# Patient Record
Sex: Female | Born: 1960
Health system: Southern US, Community
[De-identification: ages and names within clinical notes are randomized; demographics above are authoritative.]

## PROBLEM LIST (undated history)

## (undated) DIAGNOSIS — J302 Other seasonal allergic rhinitis: Secondary | ICD-10-CM

## (undated) DIAGNOSIS — R002 Palpitations: Secondary | ICD-10-CM

## (undated) DIAGNOSIS — Z8701 Personal history of pneumonia (recurrent): Secondary | ICD-10-CM

## (undated) DIAGNOSIS — M1990 Unspecified osteoarthritis, unspecified site: Secondary | ICD-10-CM

## (undated) DIAGNOSIS — T7840XA Allergy, unspecified, initial encounter: Secondary | ICD-10-CM

## (undated) DIAGNOSIS — D649 Anemia, unspecified: Secondary | ICD-10-CM

## (undated) HISTORY — DX: Other seasonal allergic rhinitis: J30.2

## (undated) HISTORY — PX: KNEE ARTHROSCOPY: SUR90

## (undated) HISTORY — PX: COLONOSCOPY: SHX174

## (undated) HISTORY — DX: Anemia, unspecified: D64.9

## (undated) HISTORY — DX: Allergy, unspecified, initial encounter: T78.40XA

---

## 1998-02-02 ENCOUNTER — Other Ambulatory Visit: Admission: RE | Admit: 1998-02-02 | Discharge: 1998-02-02 | Payer: Self-pay | Admitting: Obstetrics & Gynecology

## 1998-02-02 ENCOUNTER — Other Ambulatory Visit: Admission: RE | Admit: 1998-02-02 | Discharge: 1998-02-02 | Payer: Self-pay | Admitting: Obstetrics and Gynecology

## 1999-05-22 ENCOUNTER — Other Ambulatory Visit: Admission: RE | Admit: 1999-05-22 | Discharge: 1999-05-22 | Payer: Self-pay | Admitting: Obstetrics and Gynecology

## 2000-08-25 ENCOUNTER — Other Ambulatory Visit: Admission: RE | Admit: 2000-08-25 | Discharge: 2000-08-25 | Payer: Self-pay | Admitting: Obstetrics and Gynecology

## 2000-12-31 ENCOUNTER — Encounter: Payer: Self-pay | Admitting: Obstetrics and Gynecology

## 2000-12-31 ENCOUNTER — Ambulatory Visit (HOSPITAL_COMMUNITY): Admission: RE | Admit: 2000-12-31 | Discharge: 2000-12-31 | Payer: Self-pay | Admitting: Obstetrics and Gynecology

## 2007-10-15 ENCOUNTER — Ambulatory Visit: Payer: Self-pay | Admitting: Family Medicine

## 2007-10-15 DIAGNOSIS — G43009 Migraine without aura, not intractable, without status migrainosus: Secondary | ICD-10-CM

## 2007-10-15 DIAGNOSIS — K219 Gastro-esophageal reflux disease without esophagitis: Secondary | ICD-10-CM | POA: Insufficient documentation

## 2007-10-15 DIAGNOSIS — I4949 Other premature depolarization: Secondary | ICD-10-CM | POA: Insufficient documentation

## 2008-03-16 ENCOUNTER — Ambulatory Visit (HOSPITAL_COMMUNITY): Admission: RE | Admit: 2008-03-16 | Discharge: 2008-03-16 | Payer: Self-pay | Admitting: Obstetrics and Gynecology

## 2008-03-24 ENCOUNTER — Ambulatory Visit: Payer: Self-pay | Admitting: Family Medicine

## 2008-04-14 ENCOUNTER — Ambulatory Visit: Payer: Self-pay | Admitting: Family Medicine

## 2008-09-09 ENCOUNTER — Telehealth: Payer: Self-pay | Admitting: Family Medicine

## 2009-02-22 ENCOUNTER — Encounter: Payer: Self-pay | Admitting: Internal Medicine

## 2009-02-24 ENCOUNTER — Ambulatory Visit: Payer: Self-pay | Admitting: Family Medicine

## 2009-03-01 ENCOUNTER — Encounter: Payer: Self-pay | Admitting: Gastroenterology

## 2009-03-21 ENCOUNTER — Ambulatory Visit (HOSPITAL_COMMUNITY): Admission: RE | Admit: 2009-03-21 | Discharge: 2009-03-21 | Payer: Self-pay | Admitting: Obstetrics and Gynecology

## 2009-03-28 DIAGNOSIS — R928 Other abnormal and inconclusive findings on diagnostic imaging of breast: Secondary | ICD-10-CM | POA: Insufficient documentation

## 2009-03-29 ENCOUNTER — Encounter: Admission: RE | Admit: 2009-03-29 | Discharge: 2009-03-29 | Payer: Self-pay | Admitting: Obstetrics and Gynecology

## 2010-09-25 ENCOUNTER — Ambulatory Visit (HOSPITAL_COMMUNITY)
Admission: RE | Admit: 2010-09-25 | Discharge: 2010-09-25 | Payer: Self-pay | Source: Home / Self Care | Attending: Family Medicine | Admitting: Family Medicine

## 2010-11-27 ENCOUNTER — Ambulatory Visit: Payer: Self-pay | Admitting: Family Medicine

## 2010-12-04 ENCOUNTER — Encounter: Payer: Self-pay | Admitting: Family Medicine

## 2011-01-21 ENCOUNTER — Ambulatory Visit (INDEPENDENT_AMBULATORY_CARE_PROVIDER_SITE_OTHER): Payer: Commercial Managed Care - PPO | Admitting: Family Medicine

## 2011-01-21 ENCOUNTER — Other Ambulatory Visit (HOSPITAL_COMMUNITY)
Admission: RE | Admit: 2011-01-21 | Discharge: 2011-01-21 | Disposition: A | Discharge status: Home / Self Care | Payer: 59 | Source: Ambulatory Visit | Attending: Family Medicine | Admitting: Family Medicine

## 2011-01-21 ENCOUNTER — Encounter: Payer: Self-pay | Admitting: Family Medicine

## 2011-01-21 VITALS — BP 90/60 | HR 72 | Temp 99.1°F | Ht 66.5 in | Wt 154.0 lb

## 2011-01-21 DIAGNOSIS — Z1212 Encounter for screening for malignant neoplasm of rectum: Secondary | ICD-10-CM

## 2011-01-21 DIAGNOSIS — Z01419 Encounter for gynecological examination (general) (routine) without abnormal findings: Secondary | ICD-10-CM | POA: Insufficient documentation

## 2011-01-21 DIAGNOSIS — K219 Gastro-esophageal reflux disease without esophagitis: Secondary | ICD-10-CM

## 2011-01-21 DIAGNOSIS — Z1159 Encounter for screening for other viral diseases: Secondary | ICD-10-CM | POA: Insufficient documentation

## 2011-01-21 DIAGNOSIS — Z0000 Encounter for general adult medical examination without abnormal findings: Secondary | ICD-10-CM

## 2011-01-21 MED ORDER — ESOMEPRAZOLE MAGNESIUM 40 MG PO CPDR: 40.0000 mg | DELAYED_RELEASE_CAPSULE | Freq: Two times a day (BID) | ORAL | Status: DC

## 2011-01-21 NOTE — Patient Instructions (Addendum)
Call if interested in referral to GI for colonoscopy. Return stool cards in meantime. Work on regular exercise. Start calcium and vit D 600 mg/400 IU daily to twice a day.

## 2011-01-21 NOTE — Assessment & Plan Note (Signed)
Poor control... Breakthrough symptoms later in the day. Other treaments ineffective.  pt not interested in ENDO or GI referral at this time.  Will try BID dosing of nexium.

## 2011-01-21 NOTE — Progress Notes (Signed)
Subjective:    Patient ID: Taylor Cain, female    DOB: 08/28/1961, 50 y.o.   MRN: [id number]  HPI The patient is here for annual wellness exam and preventative care.   HAs not been seen in office since 2010.Marland Kitchen Last CPX in 2009.  GERD, chronic poor control. Off kapidex ... Helped a lot with later day symptoms but caused constipaton severe. Since off meds has constant symptoms. Has to use daily tums, zantac. Mouth full of sores from acid.   In past nexium has helped except by late evening and night she has significant breakthrough reflux symptoms. Prilosec helps nut has same breakthrough possibilities.  Has never had an endoscopy.   Allergies, chronic seasonal, well controlled on claritin prn.  Recent labs: [facility] at [facility]: Chol: 168, LDL 89, HDL 63, Tri 79  Hg 11.1.. No dizziness, no SOB, no fatigue. Regular menses.Marland KitchenMarland KitchenOn heaviest day 3 super tampons.  DXA nml at age 43.   Review of Systems  Constitutional: Negative for fever, fatigue and unexpected weight change.  HENT: Negative for ear pain, congestion, sore throat, sneezing, trouble swallowing and sinus pressure.   Eyes: Negative for pain and itching.  Respiratory: Negative for cough, shortness of breath and wheezing.   Cardiovascular: Negative for chest pain, palpitations and leg swelling.  Gastrointestinal: Positive for constipation. Negative for nausea, vomiting, abdominal pain, diarrhea, blood in stool and abdominal distention.  Genitourinary: Negative for dysuria, hematuria, vaginal discharge, difficulty urinating and menstrual problem.  Skin: Negative for rash.  Neurological: Negative for syncope, weakness, light-headedness, numbness and headaches.  Psychiatric/Behavioral: Negative for confusion and dysphoric mood. The patient is not nervous/anxious.        Objective:   Physical Exam  Constitutional: Vital signs are normal. She appears well-developed and well-nourished. She is cooperative.  Non-toxic appearance.  She does not appear ill. No distress.  HENT:  Head: Normocephalic.  Right Ear: Hearing, tympanic membrane, external ear and ear canal normal.  Left Ear: Hearing, tympanic membrane, external ear and ear canal normal.  Nose: Nose normal.  Eyes: Conjunctivae, EOM and lids are normal. Pupils are equal, round, and reactive to light. No foreign bodies found.  Neck: Trachea normal and normal range of motion. Neck supple. Carotid bruit is not present. No mass and no thyromegaly present.  Cardiovascular: Normal rate, regular rhythm, S1 normal, S2 normal, normal heart sounds and intact distal pulses.  Exam reveals no gallop.   No murmur heard. Pulmonary/Chest: Effort normal and breath sounds normal. No respiratory distress. She has no wheezes. She has no rhonchi. She has no rales.  Abdominal: Soft. Normal appearance and bowel sounds are normal. She exhibits no distension, no fluid wave, no abdominal bruit and no mass. There is no hepatosplenomegaly. There is no tenderness. There is no rebound, no guarding and no CVA tenderness. No hernia.  Genitourinary: Vagina normal and uterus normal. No breast swelling, tenderness, discharge or bleeding. Pelvic exam was performed with patient prone. There is no rash, tenderness or lesion on the right labia. There is no rash, tenderness or lesion on the left labia. Uterus is not enlarged and not tender. Cervix exhibits motion tenderness. Cervix exhibits no discharge and no friability. Right adnexum displays no mass, no tenderness and no fullness. Left adnexum displays no mass, no tenderness and no fullness.  Lymphadenopathy:    She has no cervical adenopathy.    She has no axillary adenopathy.  Neurological: She is alert. She has normal strength. No cranial nerve deficit or sensory deficit.  Skin: Skin is warm, dry and intact. No rash noted.  Psychiatric: Her speech is normal and behavior is normal. Judgment normal. Her mood appears not anxious. Cognition and memory are  normal. She does not exhibit a depressed mood.          Assessment & Plan:  Complete Physical Exam: The patient's preventative maintenance and recommended screening tests for an annual wellness exam were reviewed in full today. Brought up to date unless services declined.  Counselled on the importance of diet, exercise, and its role in overall health and mortality. The patient's FH and SH was reviewed, including their home life, tobacco status, and drug and alcohol status.   DXA not indicated because low risk.

## 2011-01-25 ENCOUNTER — Encounter: Payer: Self-pay | Admitting: *Deleted

## 2011-02-20 ENCOUNTER — Other Ambulatory Visit (INDEPENDENT_AMBULATORY_CARE_PROVIDER_SITE_OTHER): Payer: Commercial Managed Care - PPO | Admitting: Family Medicine

## 2011-02-20 ENCOUNTER — Other Ambulatory Visit: Payer: Commercial Managed Care - PPO

## 2011-02-20 DIAGNOSIS — Z1211 Encounter for screening for malignant neoplasm of colon: Secondary | ICD-10-CM

## 2011-02-21 ENCOUNTER — Telehealth: Payer: Self-pay | Admitting: Family Medicine

## 2011-02-21 NOTE — Telephone Encounter (Signed)
Notify patient of results.

## 2011-02-22 ENCOUNTER — Encounter: Payer: Self-pay | Admitting: *Deleted

## 2011-02-22 NOTE — Telephone Encounter (Signed)
Letter mailed to patient.

## 2011-08-13 ENCOUNTER — Encounter: Payer: Self-pay | Admitting: Family Medicine

## 2011-08-13 ENCOUNTER — Ambulatory Visit (INDEPENDENT_AMBULATORY_CARE_PROVIDER_SITE_OTHER): Payer: Commercial Managed Care - PPO | Admitting: Family Medicine

## 2011-08-13 VITALS — BP 120/70 | HR 97 | Temp 99.5°F | Ht 66.5 in | Wt 155.8 lb

## 2011-08-13 DIAGNOSIS — J189 Pneumonia, unspecified organism: Secondary | ICD-10-CM

## 2011-08-13 DIAGNOSIS — J157 Pneumonia due to Mycoplasma pneumoniae: Secondary | ICD-10-CM

## 2011-08-13 MED ORDER — DOXYCYCLINE HYCLATE 100 MG PO TABS: 100.0000 mg | ORAL_TABLET | Freq: Two times a day (BID) | ORAL | Status: AC

## 2011-08-13 NOTE — Progress Notes (Signed)
  Subjective:    Patient ID: Taylor Cain, female    DOB: 29-Nov-1960, 50 y.o.   MRN: [id number]  HPI  Taylor Cain, a 50 y.o. female presents today in the office for the following:    Pneumonia: Patient complains of evaluation of cough, evaluation of fever, possible bronchitis, possible pneumonia. Patient describes symptoms of shortness of breath on exertion, pleuritic chest pain, arthralgias, cough, fever to 101, headache, malaise and sputum production. Symptoms began 2 weeks ago and are gradually worsening since that time. Patient denies nausea and vomiting or history of previous pneumonias. Treatment thus far includes OTC analgesics/antipyretics: not very effective and anti-tussive: not very effective Past pulmonary history is significant for no history of pneumonia or bronchitis   The PMH, PSH, Social History, Family History, Medications, and allergies have been reviewed in [facility], and have been updated if relevant.   Review of Systems ROS: GEN: Acute illness details above GI: Tolerating PO intake GU: maintaining adequate hydration and urination Pulm: No SOB Interactive and getting along well at home.  Otherwise, ROS is as per the HPI.     Objective:   Physical Exam   Physical Exam  Blood pressure 120/70, pulse 97, temperature 99.5 F (37.5 C), temperature source Oral, height 5' 6.5" (1.689 m), weight 155 lb 12.8 oz (70.67 kg), SpO2 98.00%.  GEN: A and O x 3. WDWN. NAD.    ENT: Nose clear, ext NML.  No LAD.  No JVD.  TM's clear. Oropharynx clear.  PULM: Normal WOB, no distress. No crackles. Rhonchi - midlungs without crackles CV: RRR, no M/G/R, No rubs, No JVD.   ABD: S, NT, ND, + BS. No rebound. No guarding. No HSM.   EXT: warm and well-perfused, No c/c/e. PSYCH: Pleasant and conversant.       Assessment & Plan:   1. Walking pneumonia  doxycycline (VIBRA-TABS) 100 MG tablet    rx with doxy Prn cough meds

## 2011-09-06 ENCOUNTER — Telehealth: Payer: Self-pay | Admitting: Internal Medicine

## 2011-09-06 MED ORDER — MOXIFLOXACIN HCL 400 MG PO TABS: 400.0000 mg | ORAL_TABLET | Freq: Every day | ORAL | Status: AC

## 2011-09-06 NOTE — Telephone Encounter (Signed)
Please call to get more info.. When diagnosed? What antibiotic, when completed etc.

## 2011-09-06 NOTE — Telephone Encounter (Signed)
Patient called and stated she was Dx with pneumonia and she is still having a nagging cough and chest slightly tight and wanted to know what she could do.

## 2011-09-06 NOTE — Telephone Encounter (Signed)
Patient diagnosed on 08-13-2011 Doxycycline 10 day course [facility] 08-23-2011

## 2011-09-06 NOTE — Telephone Encounter (Signed)
Let pt know we will broaden antibiotics... 5 day course of avelox. If not improved after completed make appt for re-eval.

## 2011-09-06 NOTE — Telephone Encounter (Signed)
Patient advised.

## 2011-10-02 ENCOUNTER — Other Ambulatory Visit: Payer: Self-pay | Admitting: Family Medicine

## 2013-01-22 ENCOUNTER — Other Ambulatory Visit: Payer: Self-pay | Admitting: Family Medicine

## 2013-01-22 DIAGNOSIS — Z1231 Encounter for screening mammogram for malignant neoplasm of breast: Secondary | ICD-10-CM

## 2013-01-25 ENCOUNTER — Other Ambulatory Visit (INDEPENDENT_AMBULATORY_CARE_PROVIDER_SITE_OTHER): Payer: Commercial Managed Care - PPO

## 2013-01-25 DIAGNOSIS — Z13220 Encounter for screening for lipoid disorders: Secondary | ICD-10-CM

## 2013-01-25 DIAGNOSIS — Z131 Encounter for screening for diabetes mellitus: Secondary | ICD-10-CM

## 2013-01-25 DIAGNOSIS — Z0000 Encounter for general adult medical examination without abnormal findings: Secondary | ICD-10-CM

## 2013-01-25 LAB — CBC WITH DIFFERENTIAL/PLATELET
Basophils Relative: 1.5 % (ref 0.0–3.0)
Eosinophils Relative: 3.6 % (ref 0.0–5.0)
HCT: 29.3 % — ABNORMAL LOW (ref 36.0–46.0)
MCV: 75.8 fl — ABNORMAL LOW (ref 78.0–100.0)
Monocytes Absolute: 0.6 10*3/uL (ref 0.1–1.0)
Monocytes Relative: 12.5 % — ABNORMAL HIGH (ref 3.0–12.0)
Neutrophils Relative %: 41.6 % — ABNORMAL LOW (ref 43.0–77.0)
RBC: 3.87 Mil/uL (ref 3.87–5.11)
WBC: 4.6 10*3/uL (ref 4.5–10.5)

## 2013-01-25 LAB — COMPREHENSIVE METABOLIC PANEL
AST: 22 U/L (ref 0–37)
Alkaline Phosphatase: 43 U/L (ref 39–117)
BUN: 13 mg/dL (ref 6–23)
Calcium: 9 mg/dL (ref 8.4–10.5)
Creatinine, Ser: 0.7 mg/dL (ref 0.4–1.2)

## 2013-01-25 LAB — LIPID PANEL
Cholesterol: 162 mg/dL (ref 0–200)
LDL Cholesterol: 86 mg/dL (ref 0–99)
Triglycerides: 91 mg/dL (ref 0.0–149.0)
VLDL: 18.2 mg/dL (ref 0.0–40.0)

## 2013-01-28 ENCOUNTER — Ambulatory Visit (HOSPITAL_COMMUNITY)
Admission: RE | Admit: 2013-01-28 | Discharge: 2013-01-28 | Disposition: A | Discharge status: Home / Self Care | Payer: 59 | Source: Ambulatory Visit | Attending: Family Medicine | Admitting: Family Medicine

## 2013-01-28 DIAGNOSIS — Z1231 Encounter for screening mammogram for malignant neoplasm of breast: Secondary | ICD-10-CM | POA: Insufficient documentation

## 2013-01-29 ENCOUNTER — Ambulatory Visit (INDEPENDENT_AMBULATORY_CARE_PROVIDER_SITE_OTHER): Payer: Commercial Managed Care - PPO | Admitting: Family Medicine

## 2013-01-29 ENCOUNTER — Encounter: Payer: Self-pay | Admitting: Family Medicine

## 2013-01-29 VITALS — BP 130/90 | HR 71 | Temp 97.4°F | Ht 66.5 in | Wt 152.5 lb

## 2013-01-29 DIAGNOSIS — L680 Hirsutism: Secondary | ICD-10-CM

## 2013-01-29 DIAGNOSIS — Z01419 Encounter for gynecological examination (general) (routine) without abnormal findings: Secondary | ICD-10-CM

## 2013-01-29 DIAGNOSIS — D509 Iron deficiency anemia, unspecified: Secondary | ICD-10-CM | POA: Insufficient documentation

## 2013-01-29 DIAGNOSIS — L03019 Cellulitis of unspecified finger: Secondary | ICD-10-CM | POA: Insufficient documentation

## 2013-01-29 DIAGNOSIS — D649 Anemia, unspecified: Secondary | ICD-10-CM

## 2013-01-29 DIAGNOSIS — Z1212 Encounter for screening for malignant neoplasm of rectum: Secondary | ICD-10-CM

## 2013-01-29 DIAGNOSIS — L700 Acne vulgaris: Secondary | ICD-10-CM | POA: Insufficient documentation

## 2013-01-29 DIAGNOSIS — L03011 Cellulitis of right finger: Secondary | ICD-10-CM

## 2013-01-29 DIAGNOSIS — L708 Other acne: Secondary | ICD-10-CM

## 2013-01-29 DIAGNOSIS — Z0000 Encounter for general adult medical examination without abnormal findings: Secondary | ICD-10-CM

## 2013-01-29 LAB — VITAMIN B12: Vitamin B-12: 697 pg/mL (ref 211–911)

## 2013-01-29 LAB — IBC PANEL
%SAT: 4 % — ABNORMAL LOW (ref 20–55)
TIBC: 423 ug/dL (ref 250–470)
UIBC: 408 ug/dL — ABNORMAL HIGH (ref 125–400)

## 2013-01-29 LAB — IRON: Iron: 15 ug/dL — ABNORMAL LOW (ref 42–145)

## 2013-01-29 MED ORDER — EFLORNITHINE HCL 13.9 % EX CREA: 1.0000 "application " | TOPICAL_CREAM | Freq: Two times a day (BID) | CUTANEOUS | Status: DC

## 2013-01-29 MED ORDER — ESOMEPRAZOLE MAGNESIUM 40 MG PO CPDR: DELAYED_RELEASE_CAPSULE | ORAL | Status: DC

## 2013-01-29 MED ORDER — DOXYCYCLINE HYCLATE 100 MG PO CAPS: 100.0000 mg | ORAL_CAPSULE | Freq: Two times a day (BID) | ORAL | Status: DC

## 2013-01-29 NOTE — Progress Notes (Signed)
The patient is here for annual wellness exam and preventative care.  She reports that her last menses was several months ago. Hot flashes at night ( 3-4 times a night).March Rummage with her sleep and quality of life. Hasn't noted any moodiness.   She wishes to consider medication to treat.  No current exercsie regimen.  Has been having an acne flare recently.Mora Appl old doxycycline 100 mg twice a day for 3 days.   In 07/2012 she smashed right thumb, has a divit, mildly tender since. Last night she began having pain in thumb, slight redness under thumb and pain at site of nail. Applied ice and ibuprofen. She has been using that thumb to put med in dogs mouth.  GERD: well controlled on nexium. Causes her some nexium.   Allergies, chronic seasonal, well controlled on claritin prn.   Reviewed labs in detail with pt. Lab Results  Component Value Date   CHOL 162 01/25/2013   HDL 57.70 01/25/2013   LDLCALC 86 01/25/2013   TRIG 91.0 01/25/2013   CHOLHDL 3 01/25/2013    Regular menses... Until stopped few weeks ago.  Anemic in past.. Hg at 9.7 She is fatigued.  DXA nml at age 13.   Review of Systems  Constitutional: Negative for fever, fatigue and unexpected weight change.  HENT: Negative for ear pain, congestion, sore throat, sneezing, trouble swallowing and sinus pressure.  Eyes: Negative for pain and itching.  Respiratory: Negative for cough, shortness of breath and wheezing.  Cardiovascular: Negative for chest pain, palpitations and leg swelling.  Gastrointestinal: Positive for constipation. Negative for nausea, vomiting, abdominal pain, diarrhea, blood in stool and abdominal distention.  Genitourinary: Negative for dysuria, hematuria, vaginal discharge, difficulty urinating and menstrual problem.  Skin: Negative for rash.  Neurological: Negative for syncope, weakness, light-headedness, numbness and headaches.  Psychiatric/Behavioral: Negative for confusion and dysphoric mood. The  patient is not nervous/anxious.  Objective:   Physical Exam  Constitutional: Vital signs are normal. She appears well-developed and well-nourished. She is cooperative. Non-toxic appearance. She does not appear ill. No distress.  HENT:  Head: Normocephalic.  Right Ear: Hearing, tympanic membrane, external ear and ear canal normal.  Left Ear: Hearing, tympanic membrane, external ear and ear canal normal.  Nose: Nose normal.  Eyes: Conjunctivae, EOM and lids are normal. Pupils are equal, round, and reactive to light. No foreign bodies found.  Neck: Trachea normal and normal range of motion. Neck supple. Carotid bruit is not present. No mass and no thyromegaly present.  Cardiovascular: Normal rate, regular rhythm, S1 normal, S2 normal, normal heart sounds and intact distal pulses. Exam reveals no gallop.  No murmur heard.  Pulmonary/Chest: Effort normal and breath sounds normal. No respiratory distress. She has no wheezes. She has no rhonchi. She has no rales.  Abdominal: Soft. Normal appearance and bowel sounds are normal. She exhibits no distension, no fluid wave, no abdominal bruit and no mass. There is no hepatosplenomegaly. There is no tenderness. There is no rebound, no guarding and no CVA tenderness. No hernia.  Genitourinary: Vagina normal and uterus normal. No breast swelling, tenderness, discharge or bleeding. Pelvic exam was performed with patient prone. There is no rash, tenderness or lesion on the right labia. There is no rash, tenderness or lesion on the left labia. Uterus is not enlarged and not tender. Left adnexum displays no mass, no tenderness and no fullness.  Lymphadenopathy:  She has no cervical adenopathy.  She has no axillary adenopathy.  Neurological: She is alert.  She has normal strength. No cranial nerve deficit or sensory deficit.  Skin: Skin is warm, dry and intact. No rash noted.  Right thumb nail tip with dent and slight redness, tender under nail at bed. No paronychia  or thumb pulp.   Psychiatric: Her speech is normal and behavior is normal. Judgment normal. Her mood appears not anxious. Cognition and memory are normal. She does not exhibit a depressed mood.  Assessment & Plan:   Complete Physical Exam:  The patient's preventative maintenance and recommended screening tests for an annual wellness exam were reviewed in full today.  Brought up to date unless services declined.  Counselled on the importance of diet, exercise, and its role in overall health and mortality.  The patient's FH and SH was reviewed, including their home life, tobacco status, and drug and alcohol status.   Vaccines:uptodate with Tdap DXA not indicated because low risk. Mammo: Had a mammogram last evening. Will await results. PAP/DVE: Nml 2012, on q 3 year schedule for pap, DVE yearly.  No smoker. Colon: IFOB yearly.

## 2013-01-29 NOTE — Patient Instructions (Addendum)
Work on regular exercise 3-5 times a week. Increase estrogen containing foods like soy some. Trial of black cohosh for hot flashes. Can try melatonin 3 mg at bedtime. Healthy sleep hygiene. Call if sleep still disturbed by hot flashes in next 1-2 months. Complete at least 10 days of the antibitoic doxycycline for skin as well as nail issue. Stop at lab on your way out for stool test and labs.

## 2013-01-29 NOTE — Assessment & Plan Note (Signed)
Vaniqa from Derm helps.. She requests refil.

## 2013-01-29 NOTE — Assessment & Plan Note (Signed)
Eval for cause...most likely iron deficiency given microcytic. Check IFOB for blood in stool.

## 2013-01-29 NOTE — Assessment & Plan Note (Signed)
Refilled doxycycline 100 mg BID

## 2013-01-29 NOTE — Assessment & Plan Note (Signed)
Possible small bacterial infection vs fungal infection in nail bed of right thumb... Treat with doxycycline as she is already on this... For 10 days total. Soak finger in warm water twice daily.

## 2013-02-04 ENCOUNTER — Encounter: Payer: Self-pay | Admitting: Internal Medicine

## 2013-02-04 ENCOUNTER — Telehealth: Payer: Self-pay | Admitting: Family Medicine

## 2013-02-04 ENCOUNTER — Ambulatory Visit (INDEPENDENT_AMBULATORY_CARE_PROVIDER_SITE_OTHER): Payer: 59 | Admitting: Internal Medicine

## 2013-02-04 VITALS — BP 118/70 | HR 90 | Temp 99.1°F | Wt 145.0 lb

## 2013-02-04 DIAGNOSIS — A088 Other specified intestinal infections: Secondary | ICD-10-CM

## 2013-02-04 DIAGNOSIS — A084 Viral intestinal infection, unspecified: Secondary | ICD-10-CM | POA: Insufficient documentation

## 2013-02-04 MED ORDER — PROMETHAZINE HCL 25 MG PO TABS: 25.0000 mg | ORAL_TABLET | Freq: Four times a day (QID) | ORAL | Status: DC | PRN

## 2013-02-04 NOTE — Telephone Encounter (Signed)
Will check at appt

## 2013-02-04 NOTE — Telephone Encounter (Signed)
Patient Information:  Caller Name: Ercelle  Phone: [phone]  Patient: Taylor Cain  Gender: Female  DOB: 1961-06-08  Age: 52 Years  PCP: Kerby Nora (Family Practice)  Pregnant: No  Office Follow Up:  Does the office need to follow up with this patient?: No  Instructions For The Office: N/A  RN Note:  vomiting > 48 hours.  States has diarrhea as well.  Taking gatorade but continues to have vomiting.  Last episode of vomiting 2130 02/03/13.  Last episode of diarrhea 1100 02/04/13.  States has crampy abdominal pain.  c/o headache and body aches.  Afebrile.  States has had > 15 diarrheal stools in past 24 hours.  Per diarrhea protocol, advised appt now; appt scheduled 1530 02/04/13 with Dr. Alphonsus Sias.  krs/can  Symptoms  Reason For Call & Symptoms: vomiting, diarrhea  Reviewed Health History In EMR: Yes  Reviewed Medications In EMR: Yes  Reviewed Allergies In EMR: Yes  Reviewed Surgeries / Procedures: Yes  Date of Onset of Symptoms: 02/02/2013 OB / GYN:  LMP: Unknown  Guideline(s) Used:  Diarrhea  Disposition Per Guideline:   Go to Office Now  Reason For Disposition Reached:   Age < 60 years and has had > 15 diarrhea stools in past 24 hours  Advice Given:  N/A  Patient Will Follow Care Advice:  YES  Appointment Scheduled:  02/04/2013 15:30:00 Appointment Scheduled Provider:  Tillman Abide (Family Practice)

## 2013-02-04 NOTE — Assessment & Plan Note (Signed)
Likely is norovirus given the systemic symptoms and length of illness Discussed pedialyte for rehydration Tylenol Phenergan to calm stomach

## 2013-02-04 NOTE — Patient Instructions (Signed)
Please try acetaminophen as needed for your headache. Try pedialyte (32-64 ounces today) for rehydration The promethazine can calm the nausea and may help you sleep

## 2013-02-04 NOTE — Progress Notes (Signed)
  Subjective:    Patient ID: Taylor Cain, female    DOB: June 04, 1961, 52 y.o.   MRN: [id number]  HPI Thinks she may have norovirus (some reports of GI illness in other staff at [facility]) Started vomiting 2 mornings ago, diarrhea started soon after Headaches, bodyaches Vomiting recurred yesterday Has only been able to drink a little bit of fluids today Now with churning in stomach again Only 2 stools today  Some burning and nausea Tried sudafed and advil yesterday No hematemesis or blood in stool Feels like she had fever yesterday  Current Outpatient Prescriptions on File Prior to Visit  Medication Sig Dispense Refill  . doxycycline (VIBRAMYCIN) 100 MG capsule Take 1 capsule (100 mg total) by mouth 2 (two) times daily.  60 capsule  5  . Eflornithine HCl 13.9 % cream Apply 1 application topically 2 (two) times daily with a meal.  30 g  11  . esomeprazole (NEXIUM) 40 MG capsule TAKE 1 CAPSULE BY MOUTH TWICE DAILY  180 capsule  3  . loratadine (CLARITIN) 10 MG tablet Take 10 mg by mouth daily.         No current facility-administered medications on file prior to visit.    No Known Allergies  Past Medical History  Diagnosis Date  . GERD (gastroesophageal reflux disease)     Past Surgical History  Procedure Laterality Date  . Knee arthroscopy  1990, 1992    torn acl    Family History  Problem Relation Age of Onset  . Migraines Mother   . Hypertension Mother   . Hypertension Maternal Grandmother   . Alzheimer's disease Maternal Grandmother   . Diabetes Maternal Grandfather     History   Social History  . Marital Status: Divorced    Spouse Name: N/A    Number of Children: N/A  . Years of Education: N/A   Occupational History  . DIRECTOR [employer]   Social History Main Topics  . Smoking status: Never Smoker   . Smokeless tobacco: Not on file  . Alcohol Use: Yes     Comment: glass of wine 3 x per week  . Drug Use: No  . Sexually Active: Not on file   Other  Topics Concern  . Not on file   Social History Narrative   No regular exercise   Eat fruits and veggies, no fast food, lean meats   Review of Systems Never had problems with doxy in past (on hold now) No cough  No SOB     Objective:   Physical Exam  Constitutional: She appears well-developed and well-nourished.  Looks uncomfortable but no distress  Neck: Normal range of motion. Neck supple.  Cardiovascular: Normal rate and regular rhythm.   Pulmonary/Chest: Effort normal and breath sounds normal. No respiratory distress. She has no wheezes. She has no rales.  Abdominal: Soft. Bowel sounds are normal. She exhibits no distension and no mass. There is no tenderness. There is no rebound and no guarding.  Lymphadenopathy:    She has no cervical adenopathy.          Assessment & Plan:

## 2013-03-08 ENCOUNTER — Telehealth: Payer: Self-pay

## 2013-03-08 NOTE — Telephone Encounter (Signed)
Spoke with pt; pt did have mammogram recently and the doctor who interpreted the mammogram was Vincenza Hews, MD. Tora Duck said that is what the EOB is for. Resolved issue.

## 2013-03-08 NOTE — Telephone Encounter (Signed)
Pt received bill for xray that she has not had. Left v/m for pt to call back.

## 2014-01-24 ENCOUNTER — Other Ambulatory Visit: Payer: Self-pay | Admitting: Family Medicine

## 2014-01-24 DIAGNOSIS — Z1231 Encounter for screening mammogram for malignant neoplasm of breast: Secondary | ICD-10-CM

## 2014-02-01 ENCOUNTER — Ambulatory Visit (HOSPITAL_COMMUNITY)
Admission: RE | Admit: 2014-02-01 | Discharge: 2014-02-01 | Disposition: A | Discharge status: Home / Self Care | Payer: 59 | Source: Ambulatory Visit | Attending: Family Medicine | Admitting: Family Medicine

## 2014-02-01 DIAGNOSIS — Z1231 Encounter for screening mammogram for malignant neoplasm of breast: Secondary | ICD-10-CM | POA: Insufficient documentation

## 2014-02-09 ENCOUNTER — Encounter: Payer: 59 | Admitting: Internal Medicine

## 2014-02-14 ENCOUNTER — Other Ambulatory Visit: Payer: Self-pay | Admitting: Family Medicine

## 2014-02-14 DIAGNOSIS — Z1231 Encounter for screening mammogram for malignant neoplasm of breast: Secondary | ICD-10-CM

## 2014-05-23 ENCOUNTER — Encounter: Payer: Self-pay | Admitting: Gastroenterology

## 2014-07-12 ENCOUNTER — Ambulatory Visit (AMBULATORY_SURGERY_CENTER): Payer: Self-pay | Admitting: *Deleted

## 2014-07-12 VITALS — Ht 66.5 in | Wt 155.2 lb

## 2014-07-12 DIAGNOSIS — Z1211 Encounter for screening for malignant neoplasm of colon: Secondary | ICD-10-CM

## 2014-07-12 MED ORDER — MOVIPREP 100 G PO SOLR: ORAL | Status: DC

## 2014-07-12 NOTE — Progress Notes (Signed)
No allergies to eggs or soy. No problems with anesthesia.  Pt given Emmi instructions for colonoscopy  No oxygen use  No diet drug use  

## 2014-07-26 ENCOUNTER — Ambulatory Visit (AMBULATORY_SURGERY_CENTER): Payer: 59 | Admitting: Gastroenterology

## 2014-07-26 ENCOUNTER — Telehealth: Payer: Self-pay

## 2014-07-26 ENCOUNTER — Encounter: Payer: Self-pay | Admitting: Gastroenterology

## 2014-07-26 VITALS — BP 112/69 | HR 57 | Temp 96.6°F | Resp 18 | Ht 66.0 in | Wt 155.0 lb

## 2014-07-26 DIAGNOSIS — Z1211 Encounter for screening for malignant neoplasm of colon: Secondary | ICD-10-CM

## 2014-07-26 MED ORDER — SODIUM CHLORIDE 0.9 % IV SOLN: 500.0000 mL | INTRAVENOUS | Status: DC

## 2014-07-26 NOTE — Op Note (Signed)
[place]  [address] [place], [id number]   COLONOSCOPY PROCEDURE REPORT  PATIENT: Taylor Cain, Taylor Cain  MR#: [id number] BIRTHDATE: [birth year] , 29  yrs. old GENDER: female ENDOSCOPIST: Ladene Artist, MD, [facility] REFERRED NI:DPOEUMP, Amy E. PROCEDURE DATE:  07/26/2014 PROCEDURE:   Colonoscopy, screening First Screening Colonoscopy - Avg.  risk and is 50 yrs.  old or older Yes.  Prior Negative Screening - Now for repeat screening. N/A  History of Adenoma - Now for follow-up colonoscopy & has been > or = to 3 yrs.  N/A  Polyps Removed Today? No.  Polyps Removed Today? No.  Recommend repeat exam, <10 yrs? Polyps Removed Today? No.  Recommend repeat exam, <10 yrs? No. ASA CLASS:   Class II INDICATIONS:average risk for colorectal cancer. MEDICATIONS: Monitored anesthesia care and Propofol 200 mg IV DESCRIPTION OF PROCEDURE:   After the risks benefits and alternatives of the procedure were thoroughly explained, informed consent was obtained.  The digital rectal exam revealed no abnormalities of the rectum.   The [id number]  endoscope was introduced through the anus and advanced to the cecum, which was identified by both the appendix and ileocecal valve. No adverse events experienced.   The quality of the prep was excellent, using MoviPrep  The instrument was then slowly withdrawn as the colon was fully examined.    COLON FINDINGS: A normal appearing cecum, ileocecal valve, and appendiceal orifice were identified.  The ascending, transverse, descending, sigmoid colon, and rectum appeared unremarkable. Retroflexed views revealed no abnormalities. The time to cecum=3 minutes 40 seconds.  Withdrawal time=12 minutes 56 seconds.  The scope was withdrawn and the procedure completed.  COMPLICATIONS: There were no immediate complications.  ENDOSCOPIC IMPRESSION: 1.  Normal colonoscopy  RECOMMENDATIONS: 1.  Continue to follow colorectal cancer screening  guidelines for "routine risk" patients with a repeat colonoscopy in 10 years. There is no need for routine, screening FOBT (stool) testing for at least 5 years.  eSigned:  Ladene Artist, MD, [facility] 07/26/2014 8:34 AM

## 2014-07-26 NOTE — Telephone Encounter (Signed)
Pt left v/m requesting access to mychart. Left v/m for pt to cb.

## 2014-07-26 NOTE — Patient Instructions (Signed)
Discharge instructions given with verbal understanding. Normal exam. Resume previous medications. YOU HAD AN ENDOSCOPIC PROCEDURE TODAY AT THE [facility]: Refer to the procedure report that was given to you for any specific questions about what was found during the examination.  If the procedure report does not answer your questions, please call your gastroenterologist to clarify.  If you requested that your care partner not be given the details of your procedure findings, then the procedure report has been included in a sealed envelope for you to review at your convenience later.  YOU SHOULD EXPECT: Some feelings of bloating in the abdomen. Passage of more gas than usual.  Walking can help get rid of the air that was put into your GI tract during the procedure and reduce the bloating. If you had a lower endoscopy (such as a colonoscopy or flexible sigmoidoscopy) you may notice spotting of blood in your stool or on the toilet paper. If you underwent a bowel prep for your procedure, then you may not have a normal bowel movement for a few days.  DIET: Your first meal following the procedure should be a light meal and then it is ok to progress to your normal diet.  A half-sandwich or bowl of soup is an example of a good first meal.  Heavy or fried foods are harder to digest and may make you feel nauseous or bloated.  Likewise meals heavy in dairy and vegetables can cause extra gas to form and this can also increase the bloating.  Drink plenty of fluids but you should avoid alcoholic beverages for 24 hours.  ACTIVITY: Your care partner should take you home directly after the procedure.  You should plan to take it easy, moving slowly for the rest of the day.  You can resume normal activity the day after the procedure however you should NOT DRIVE or use heavy machinery for 24 hours (because of the sedation medicines used during the test).    SYMPTOMS TO REPORT IMMEDIATELY: A gastroenterologist  can be reached at any hour.  During normal business hours, 8:30 AM to 5:00 PM Monday through Friday, call [phone].  After hours and on weekends, please call the GI answering service at [phone] who will take a message and have the physician on call contact you.   Following lower endoscopy (colonoscopy or flexible sigmoidoscopy):  Excessive amounts of blood in the stool  Significant tenderness or worsening of abdominal pains  Swelling of the abdomen that is new, acute  Fever of 100F or higher  FOLLOW UP: If any biopsies were taken you will be contacted by phone or by letter within the next 1-3 weeks.  Call your gastroenterologist if you have not heard about the biopsies in 3 weeks.  Our staff will call the home number listed on your records the next business day following your procedure to check on you and address any questions or concerns that you may have at that time regarding the information given to you following your procedure. This is a courtesy call and so if there is no answer at the home number and we have not heard from you through the emergency physician on call, we will assume that you have returned to your regular daily activities without incident.  SIGNATURES/CONFIDENTIALITY: You and/or your care partner have signed paperwork which will be entered into your electronic medical record.  These signatures attest to the fact that that the information above on your After Visit Summary has been reviewed   and is understood.  Full responsibility of the confidentiality of this discharge information lies with you and/or your care-partner. 

## 2014-07-26 NOTE — Progress Notes (Signed)
  [facility] Post-op Note  Patient: Taylor Cain  Procedure(s) Performed: colonoscopy  Patient Location: [facility] - Recovery Area  Anesthesia Type: Deep Sedation/Propofol  Level of Consciousness: awake, oriented and patient cooperative  Airway and Oxygen Therapy: Patient Spontanous Breathing  Post-op Pain: none  Post-op Assessment:  Post-op Vital signs reviewed, Patient's Cardiovascular Status Stable, Respiratory Function Stable, Patent Airway, No signs of Nausea or vomiting and Pain level controlled  Post-op Vital Signs: Reviewed and stable  Complications: No apparent anesthesia complications  Isair Inabinet E 8:36 AM

## 2014-07-27 ENCOUNTER — Telehealth: Payer: Self-pay | Admitting: *Deleted

## 2014-07-27 NOTE — Telephone Encounter (Signed)
Per note, pt not called per request, she stated she would call us if needed.

## 2014-08-11 ENCOUNTER — Encounter: Payer: Self-pay | Admitting: Family Medicine

## 2014-08-11 NOTE — Telephone Encounter (Signed)
Pt called back: Gave her the access code to her mychart acct.

## 2014-08-11 NOTE — Telephone Encounter (Signed)
Left v/m requesting cb.

## 2015-01-03 ENCOUNTER — Ambulatory Visit (INDEPENDENT_AMBULATORY_CARE_PROVIDER_SITE_OTHER): Payer: 59 | Admitting: Family Medicine

## 2015-01-03 ENCOUNTER — Encounter: Payer: Self-pay | Admitting: Family Medicine

## 2015-01-03 VITALS — BP 102/70 | HR 63 | Temp 98.4°F | Wt 152.2 lb

## 2015-01-03 DIAGNOSIS — H66003 Acute suppurative otitis media without spontaneous rupture of ear drum, bilateral: Secondary | ICD-10-CM

## 2015-01-03 DIAGNOSIS — H6690 Otitis media, unspecified, unspecified ear: Secondary | ICD-10-CM | POA: Insufficient documentation

## 2015-01-03 MED ORDER — AMOXICILLIN-POT CLAVULANATE 875-125 MG PO TABS: 1.0000 | ORAL_TABLET | Freq: Two times a day (BID) | ORAL | Status: DC

## 2015-01-03 NOTE — Patient Instructions (Signed)
Stop the ampicillin for now, change to augmentin.  Take care.  Glad to see you.

## 2015-01-03 NOTE — Progress Notes (Signed)
Pre visit review using our clinic review tool, if applicable. No additional management support is needed unless otherwise documented below in the visit note.  Sx started about 5-6 days ago.  tmax 100.5 a few days ago.  Cough, sputum, ear plugged.  White spot noted on tonsils.  No facial pain.  No wheeze.  Chest felt a little tight.  No rash, vomiting, diarrhea.  Taking mucinex the last few days.   Meds, vitals, and allergies reviewed.   ROS: See HPI.  Otherwise, noncontributory.  GEN: nad, alert and oriented HEENT: mucous membranes moist, tm with R>L erythema, nasal exam w/o erythema, clear discharge noted,  OP with cobblestoning and irritation noted on the L>R tonsil.   NECK: supple w/o LA CV: rrr.   PULM: ctab, no inc wob EXT: no edema SKIN: no acute rash

## 2015-01-03 NOTE — Assessment & Plan Note (Signed)
Hold ampicillin for now, start augmentin.  otc nsaids for now, f/u prn.  Nontoxic.  She agrees.

## 2015-01-16 ENCOUNTER — Other Ambulatory Visit: Payer: 59

## 2015-01-20 ENCOUNTER — Encounter: Payer: 59 | Admitting: Family Medicine

## 2015-04-16 ENCOUNTER — Telehealth: Payer: Self-pay | Admitting: Family Medicine

## 2015-04-16 DIAGNOSIS — Z13220 Encounter for screening for lipoid disorders: Secondary | ICD-10-CM

## 2015-04-16 NOTE — Telephone Encounter (Signed)
-----   Message from Ellamae Sia sent at 04/10/2015 11:42 AM EDT ----- Regarding: Lab orders for Monday, 7.18.16 Patient is scheduled for CPX labs, please order future labs, Thanks , Karna Christmas

## 2015-04-17 ENCOUNTER — Other Ambulatory Visit (INDEPENDENT_AMBULATORY_CARE_PROVIDER_SITE_OTHER): Payer: 59

## 2015-04-17 DIAGNOSIS — Z13220 Encounter for screening for lipoid disorders: Secondary | ICD-10-CM | POA: Diagnosis not present

## 2015-04-17 LAB — COMPREHENSIVE METABOLIC PANEL
ALBUMIN: 4.3 g/dL (ref 3.5–5.2)
ALT: 34 U/L (ref 0–35)
AST: 69 U/L — ABNORMAL HIGH (ref 0–37)
Alkaline Phosphatase: 46 U/L (ref 39–117)
BUN: 20 mg/dL (ref 6–23)
CHLORIDE: 101 meq/L (ref 96–112)
CO2: 31 mEq/L (ref 19–32)
Calcium: 9.6 mg/dL (ref 8.4–10.5)
Creatinine, Ser: 0.85 mg/dL (ref 0.40–1.20)
GFR: 73.46 mL/min (ref >60.00)
Glucose, Bld: 95 mg/dL (ref 70–99)
Potassium: 4.5 mEq/L (ref 3.5–5.1)
Sodium: 139 mEq/L (ref 135–145)
Total Bilirubin: 0.4 mg/dL (ref 0.2–1.2)
Total Protein: 7 g/dL (ref 6.0–8.3)

## 2015-04-17 LAB — LIPID PANEL
Cholesterol: 148 mg/dL (ref 0–200)
HDL: 59.5 mg/dL (ref >39.00)
LDL CALC: 77 mg/dL (ref 0–99)
NonHDL: 88.5
TRIGLYCERIDES: 59 mg/dL (ref 0.0–149.0)
Total CHOL/HDL Ratio: 2
VLDL: 11.8 mg/dL (ref 0.0–40.0)

## 2015-04-21 ENCOUNTER — Encounter: Payer: Self-pay | Admitting: Family Medicine

## 2015-04-21 ENCOUNTER — Ambulatory Visit (INDEPENDENT_AMBULATORY_CARE_PROVIDER_SITE_OTHER): Payer: 59 | Admitting: Family Medicine

## 2015-04-21 VITALS — BP 106/70 | HR 50 | Temp 98.8°F | Ht 66.25 in | Wt 147.0 lb

## 2015-04-21 DIAGNOSIS — Z0000 Encounter for general adult medical examination without abnormal findings: Secondary | ICD-10-CM

## 2015-04-21 DIAGNOSIS — R7989 Other specified abnormal findings of blood chemistry: Secondary | ICD-10-CM

## 2015-04-21 DIAGNOSIS — R945 Abnormal results of liver function studies: Secondary | ICD-10-CM

## 2015-04-21 NOTE — Progress Notes (Signed)
The patient is here for annual wellness exam and preventative care.   Doing well overall.  Exercsie regimen: 5 times a week. Diet; healty paleo diet.   Wt Readings from Last 3 Encounters:  04/21/15 147 lb (66.679 kg)  01/03/15 152 lb 4 oz (69.06 kg)  07/26/14 155 lb (70.308 kg)   GERD: well controlled on no med.  Allergies, chronic seasonal, well controlled on claritin prn.   elevated AST: drinks rare alcohol.  No tylenol, no family history of liver issues.   Reviewed labs in detail with pt. Lab Results  Component Value Date   CHOL 148 04/17/2015   HDL 59.50 04/17/2015   LDLCALC 77 04/17/2015   TRIG 59.0 04/17/2015   CHOLHDL 2 04/17/2015   DXA nml at age 32.   Review of Systems  Constitutional: Negative for fever, fatigue and unexpected weight change.  HENT: Negative for ear pain, congestion, sore throat, sneezing, trouble swallowing and sinus pressure.  Eyes: Negative for pain and itching.  Respiratory: Negative for cough, shortness of breath and wheezing.  Cardiovascular: Negative for chest pain, palpitations and leg swelling.  Gastrointestinal:  Negative for constipation.. Negative for nausea, vomiting, abdominal pain, diarrhea, blood in stool and abdominal distention.  Genitourinary: Negative for dysuria, hematuria, vaginal discharge, difficulty urinating and menstrual problem.  Skin: Negative for rash.  Neurological: Negative for syncope, weakness, light-headedness, numbness and headaches.  Psychiatric/Behavioral: Negative for confusion and dysphoric mood. The patient is not nervous/anxious.  Objective:   Physical Exam  Constitutional: Vital signs are normal. She appears well-developed and well-nourished. She is cooperative. Non-toxic appearance. She does not appear ill. No distress.  HENT:  Head: Normocephalic.  Right Ear: Hearing, tympanic membrane, external ear and ear canal normal.  Left Ear: Hearing, tympanic membrane, external ear and ear canal  normal.  Nose: Nose normal.  Eyes: Conjunctivae, EOM and lids are normal. Pupils are equal, round, and reactive to light. No foreign bodies found.  Neck: Trachea normal and normal range of motion. Neck supple. Carotid bruit is not present. No mass and no thyromegaly present.  Cardiovascular: Normal rate, regular rhythm, S1 normal, S2 normal, normal heart sounds and intact distal pulses. Exam reveals no gallop.  No murmur heard.  Pulmonary/Chest: Effort normal and breath sounds normal. No respiratory distress. She has no wheezes. She has no rhonchi. She has no rales.  Abdominal: Soft. Normal appearance and bowel sounds are normal. She exhibits no distension, no fluid wave, no abdominal bruit and no mass. There is no hepatosplenomegaly. There is no tenderness. There is no rebound, no guarding and no CVA tenderness. No hernia.  Genitourinary: Vagina normal and uterus normal. No breast swelling, tenderness, discharge or bleeding. Pelvic exam was performed with patient supine. There is no rash, tenderness or lesion on the right labia. There is no rash, tenderness or lesion on the left labia. Uterus is not enlarged and not tender. Left adnexum displays no mass, no tenderness and no fullness.  NO PAP PERFORMED. Lymphadenopathy:  She has no cervical adenopathy.  She has no axillary adenopathy.  Neurological: She is alert. She has normal strength. No cranial nerve deficit or sensory deficit.  Skin: Skin is warm, dry and intact. No rash noted.   Psychiatric: Her speech is normal and behavior is normal. Judgment normal. Her mood appears not anxious. Cognition and memory are normal. She does not exhibit a depressed mood.  Assessment & Plan:   Complete Physical Exam:  The patient's preventative maintenance and recommended screening tests for  an annual wellness exam were reviewed in full today.  Brought up to date unless services declined.  Counselled on the importance of diet, exercise, and its  role in overall health and mortality.  The patient's FH and SH was reviewed, including their home life, tobacco status, and drug and alcohol status.   Vaccines:uptodate with Tdap until 08/2015 DXA not indicated because low risk. Mammo: 02/2014 PAP/DVE: Nml 2014, on q 3 year schedule for pap, DVE yearly. No smoker. Colon:  Colonoscopy nml , Dr Fuller Plan. Recall in 10 years.  Hep C  Screening to be obtained at next lab draw.

## 2015-04-21 NOTE — Patient Instructions (Addendum)
Hold black cohosh, tylenol and alcohol. Return for lab only visit  In 1-2 weeks to check hepatic panel and hepatitis panel.  After 08/2015 TDap vaccine. Call to schedule mammogram on your own.

## 2015-04-21 NOTE — Progress Notes (Signed)
Pre visit review using our clinic review tool, if applicable. No additional management support is needed unless otherwise documented below in the visit note. 

## 2015-04-21 NOTE — Assessment & Plan Note (Signed)
Hold black cohosh as it can cause some toxicity to liver.  No ETOH and tyelnol use. Return for recheck and hepatitis panel . Pt has received hep A and B vaccines in past.

## 2015-05-05 ENCOUNTER — Other Ambulatory Visit (INDEPENDENT_AMBULATORY_CARE_PROVIDER_SITE_OTHER): Payer: 59

## 2015-05-05 DIAGNOSIS — R7989 Other specified abnormal findings of blood chemistry: Secondary | ICD-10-CM

## 2015-05-05 DIAGNOSIS — R945 Abnormal results of liver function studies: Principal | ICD-10-CM

## 2015-05-05 LAB — HEPATIC FUNCTION PANEL
ALT: 40 U/L — AB (ref 0–35)
AST: 46 U/L — ABNORMAL HIGH (ref 0–37)
Albumin: 4.5 g/dL (ref 3.5–5.2)
Alkaline Phosphatase: 72 U/L (ref 39–117)
BILIRUBIN TOTAL: 0.4 mg/dL (ref 0.2–1.2)
Bilirubin, Direct: 0.1 mg/dL (ref 0.0–0.3)
Total Protein: 7.6 g/dL (ref 6.0–8.3)

## 2015-05-06 LAB — HEPATITIS PANEL, ACUTE
HCV Ab: NEGATIVE
HEP A IGM: NONREACTIVE
HEP B C IGM: NONREACTIVE
Hepatitis B Surface Ag: NEGATIVE

## 2015-05-09 ENCOUNTER — Telehealth: Payer: Self-pay

## 2015-05-09 DIAGNOSIS — R945 Abnormal results of liver function studies: Principal | ICD-10-CM

## 2015-05-09 DIAGNOSIS — R7989 Other specified abnormal findings of blood chemistry: Secondary | ICD-10-CM

## 2015-05-09 NOTE — Telephone Encounter (Signed)
Pt left v/m requesting cb about lab results from 05/05/15.Please advise.

## 2015-06-09 ENCOUNTER — Other Ambulatory Visit: Payer: 59

## 2015-06-19 ENCOUNTER — Other Ambulatory Visit: Payer: 59

## 2015-06-26 ENCOUNTER — Other Ambulatory Visit (INDEPENDENT_AMBULATORY_CARE_PROVIDER_SITE_OTHER): Payer: 59

## 2015-06-26 DIAGNOSIS — R945 Abnormal results of liver function studies: Principal | ICD-10-CM

## 2015-06-26 DIAGNOSIS — R7989 Other specified abnormal findings of blood chemistry: Secondary | ICD-10-CM

## 2015-06-27 LAB — HEPATIC FUNCTION PANEL
ALT: 28 U/L (ref 0–35)
AST: 34 U/L (ref 0–37)
Albumin: 4.5 g/dL (ref 3.5–5.2)
Alkaline Phosphatase: 59 U/L (ref 39–117)
BILIRUBIN DIRECT: 0.1 mg/dL (ref 0.0–0.3)
Total Bilirubin: 0.3 mg/dL (ref 0.2–1.2)
Total Protein: 7.4 g/dL (ref 6.0–8.3)

## 2015-10-03 MED FILL — AMPICILLIN TR 500 MG CAP: 500 | 30 days supply | Qty: 60 | Fill #3

## 2015-10-03 MED FILL — CLINDAMYCIN PH 1% GEL: 1 | 30 days supply | Qty: 60 | Fill #2

## 2015-12-28 MED FILL — AMPICILLIN TR 500 MG CAP: 500 | 30 days supply | Qty: 60 | Fill #4

## 2015-12-28 MED FILL — CLINDAMYCIN PH 1% GEL: 1 | 30 days supply | Qty: 60 | Fill #3

## 2016-02-21 MED FILL — CLINDAMYCIN PH 1% GEL: 1 | 30 days supply | Qty: 60 | Fill #4

## 2016-02-21 MED FILL — AMPICILLIN TR 500 MG CAP: 500 | 30 days supply | Qty: 60 | Fill #5

## 2016-03-25 DIAGNOSIS — M7541 Impingement syndrome of right shoulder: Secondary | ICD-10-CM | POA: Diagnosis not present

## 2016-04-05 ENCOUNTER — Other Ambulatory Visit: Payer: Self-pay | Admitting: Family Medicine

## 2016-04-05 DIAGNOSIS — Z1231 Encounter for screening mammogram for malignant neoplasm of breast: Secondary | ICD-10-CM

## 2016-04-09 ENCOUNTER — Ambulatory Visit: Payer: 59 | Attending: Orthopedic Surgery

## 2016-04-09 DIAGNOSIS — M25511 Pain in right shoulder: Secondary | ICD-10-CM | POA: Diagnosis not present

## 2016-04-09 DIAGNOSIS — R293 Abnormal posture: Secondary | ICD-10-CM | POA: Diagnosis not present

## 2016-04-09 MED FILL — AMPICILLIN TR 500 MG CAP: 500 | 30 days supply | Qty: 60 | Fill #0

## 2016-04-09 NOTE — Therapy (Signed)
[place], [place], [address] Phone: [phone]   Fax:  [phone]  Physical Therapy Evaluation  Patient Details  Name: Taylor Cain MRN: [id number] Date of Birth: Mar 19, 1961 Referring Provider: Lacinda Axon PA  Encounter Date: 04/09/2016      PT End of Session - 04/09/16 1651    Visit Number 1   Number of Visits 8   Date for PT Re-Evaluation 06/07/16   Authorization Type UMR   PT Start Time 0345   PT Stop Time 0437   PT Time Calculation (min) 52 min   Activity Tolerance Patient tolerated treatment well   Behavior During Therapy [facility] for tasks assessed/performed      Past Medical History  Diagnosis Date  . GERD (gastroesophageal reflux disease)   . Anemia   . Seasonal allergies     Past Surgical History  Procedure Laterality Date  . Knee arthroscopy Right 1990, 1992    torn acl    There were no vitals filed for this visit.       Subjective Assessment - 04/09/16 1554    Subjective She reports onset of pain with ache and progressively worsening and was injected and she is better. She reports she had this happen before and   injection solved problem than.  Now reports pain in anterior upper arm and posterior shoulder  with  sleeping and sitting.    Limitations --  Limiting exercise   Diagnostic tests no testing   Patient Stated Goals She wants to return to working out    Currently in Pain? No/denies   Pain Score 0-No pain  in past  highest 1/10   Pain Location Shoulder   Pain Orientation Right   Pain Descriptors / Indicators --  solid pain with lifting arm   Pain Type Chronic pain   Pain Onset More than a month ago   Pain Frequency Rarely   Aggravating Factors  Movement of arm into abduction, reaching ovehead with weight   Pain Relieving Factors medication, lidocane patch            [facility] PT Assessment - 04/09/16 1545    Assessment   Medical Diagnosis RT shoulder  impingement   Referring Provider Taylor Axon PA   Onset Date/Surgical Date --  10/2015   Hand Dominance Right   Next MD Visit 04/22/16   Prior Therapy no   Precautions   Precautions None   Restrictions   Weight Bearing Restrictions No   Balance Screen   Has the patient fallen in the past 6 months No   Has the patient had a decrease in activity level because of a fear of falling?  No   Is the patient reluctant to leave their home because of a fear of falling?  No   Prior Function   Level of Independence Independent   Vocation Full time employment   Vocation Requirements administration   Leisure not able to do cardio and kick box and kettle bell exercise   Cognition   Overall Cognitive Status Within Functional Limits for tasks assessed   Posture/Postural Control   Posture Comments RT shoulder lower than RT and inferior angle RT wcapula more posterior   ROM / Strength   AROM / PROM / Strength AROM;Strength   AROM   AROM Assessment Site Shoulder   Right/Left Shoulder Right;Left   Right Shoulder Flexion 165 Degrees   Right Shoulder ABduction 180 Degrees   Right Shoulder Internal Rotation 60  Degrees   Right Shoulder External Rotation 115 Degrees   Right Shoulder Horizontal ABduction 22 Degrees   Right Shoulder Horizontal  ADduction 113 Degrees   Left Shoulder Flexion 150 Degrees   Left Shoulder ABduction 176 Degrees   Left Shoulder Internal Rotation 65 Degrees   Left Shoulder External Rotation 110 Degrees   Left Shoulder Horizontal ABduction 22 Degrees   Left Shoulder Horizontal ADduction 114 Degrees   Strength   Strength Assessment Site Shoulder   Right/Left Shoulder Right;Left   Right Shoulder Flexion 5/5   Right Shoulder Extension 5/5   Right Shoulder ABduction 5/5   Right Shoulder Internal Rotation 5/5   Right Shoulder External Rotation 5/5   Right Shoulder Horizontal ABduction 5/5   Right Shoulder Horizontal ADduction 5/5   Left Shoulder Flexion 5/5   Left Shoulder  Extension 5/5   Left Shoulder ABduction 5/5   Left Shoulder Internal Rotation 5/5   Left Shoulder External Rotation 5/5   Left Shoulder Horizontal ABduction 5/5   Left Shoulder Horizontal ADduction 5/5                           PT Education - 04/09/16 1600    Education provided Yes   Education Details POC, HEP   Person(s) Educated Patient   Methods Explanation;Demonstration;Tactile cues;Verbal cues;Handout   Comprehension Verbalized understanding;Returned demonstration          PT Short Term Goals - 04/09/16 1656    PT SHORT TERM GOAL #1   Title She will be independent with inital HEP   Time 2   Period Weeks   Status New   PT SHORT TERM GOAL #2   Title She will report able to do progression of HEP without pain   Time 4   Period Weeks   Status New           PT Long Term Goals - 04/09/16 1657    PT LONG TERM GOAL #1   Title She will be independent with all HEP issued   Time 8   Period Weeks   Status New   PT LONG TERM GOAL #2   Title She will be able to lift kettle bells without pain to return to previous workout.   Time 8   Period Weeks   Status New   PT LONG TERM GOAL #3   Title She will be able to tolerate ligth punching activity without pain.to begin return to using heavy bag for workout   Time 8   Period Days   Status New               Plan - 04/09/16 1651    Clinical Impression Statement Taylor Cain presents for low complexity evaluation with RT shoulder pain , abnormal posture, pain with flexion and abduction RT shoulder. limiting her return to preferred workout including kettle bells and cardio workout using heavy bag. She should return to working out vigoruously  post PT.    Rehab Potential Good   PT Frequency 1x / week   PT Duration 8 weeks if needed to return to gym work out   PT Treatment/Interventions Cryotherapy;Iontophoresis 4mg /ml Dexamethasone;Therapeutic exercise;Patient/family education;Manual techniques;Taping;Dry  needling   PT Next Visit Plan Add band exercises for RTC strength and scapula strength   PT Home Exercise Plan stretch , strength prone and scapula protraction /retraction   Consulted and Agree with Plan of Care Patient      Patient will benefit  from skilled therapeutic intervention in order to improve the following deficits and impairments:  Pain, Postural dysfunction, Decreased activity tolerance  Visit Diagnosis: Pain in right shoulder - Plan: PT plan of care cert/re-cert  Abnormal posture - Plan: PT plan of care cert/re-cert     Problem List Patient Active Problem List   Diagnosis Date Noted  . Elevated LFTs 04/21/2015  . AOM (acute otitis media) 01/03/2015  . Hirsutism 01/29/2013  . Acne vulgaris 01/29/2013  . Anemia 01/29/2013  . Infected nailbed of finger 01/29/2013  . MAMMOGRAM, ABNORMAL, RIGHT 03/28/2009  . MIGRAINE, COMMON 10/15/2007  . PREMATURE VENTRICULAR CONTRACTIONS 10/15/2007  . GERD 10/15/2007    [name]  PT 04/09/2016, 5:05 PM  [facility] [address] [place], [place], [address] Phone: [phone]   Fax:  [phone]  Name: Taylor Cain MRN: [id number] Date of Birth: 10/23/1960

## 2016-04-09 NOTE — Patient Instructions (Signed)
From cabinet issued supine thoracic stretch and door way 30-60 sec x 3 2x/day, scapula protraction/retraction on wall or pushup position,  And prone IT Y with emphasis on scapula movement. 1x/day 10-20 reps

## 2016-04-11 ENCOUNTER — Ambulatory Visit: Payer: 59

## 2016-04-15 DIAGNOSIS — L718 Other rosacea: Secondary | ICD-10-CM | POA: Diagnosis not present

## 2016-04-16 ENCOUNTER — Ambulatory Visit: Payer: 59 | Admitting: Physical Therapy

## 2016-04-16 DIAGNOSIS — R293 Abnormal posture: Secondary | ICD-10-CM

## 2016-04-16 DIAGNOSIS — M25511 Pain in right shoulder: Secondary | ICD-10-CM | POA: Diagnosis not present

## 2016-04-17 NOTE — Therapy (Signed)
[place], [place], [address] Phone: [phone]   Fax:  [phone]  Physical Therapy Treatment  Patient Details  Name: Taylor Cain MRN: [id number] Date of Birth: Jan 01, 1961 Referring Provider: Lacinda Axon PA  Encounter Date: 04/16/2016      PT End of Session - 04/17/16 0828    Visit Number 2   Number of Visits 8   Date for PT Re-Evaluation 06/07/16   Authorization Type UMR   PT Start Time 0432   PT Stop Time 0510   PT Time Calculation (min) 38 min   Activity Tolerance Patient tolerated treatment well   Behavior During Therapy [facility] for tasks assessed/performed      Past Medical History  Diagnosis Date  . GERD (gastroesophageal reflux disease)   . Anemia   . Seasonal allergies     Past Surgical History  Procedure Laterality Date  . Knee arthroscopy Right 1990, 1992    torn acl    There were no vitals filed for this visit.      Subjective Assessment - 04/17/16 0815    Subjective Patient reports her pain has been better. She has been doing her exercises. She has not returned to kick boxing yet.    Diagnostic tests no testing   Patient Stated Goals She wants to return to working out    Currently in Pain? No/denies                         [facility] Adult PT Treatment/Exercise - 04/17/16 0001    Shoulder Exercises: Supine   Protraction Limitations 2lb 2x10    External Rotation Limitations side lying ER 2x10    Flexion Limitations 2lb 2x10    Other Supine Exercises D2 flexion 2x10 2lb    Shoulder Exercises: Standing   External Rotation Limitations Right red 2x10   Internal Rotation Limitations Right red 2x10    Extension Limitations red 2x10    Row Limitations red 210                PT Education - 04/17/16 0824    Education provided Yes   Education Details POC, HEP    Person(s) Educated Patient   Methods Explanation;Demonstration;Tactile cues;Verbal cues    Comprehension Verbalized understanding;Returned demonstration          PT Short Term Goals - 04/17/16 0831    PT SHORT TERM GOAL #1   Title She will be independent with inital HEP   Time 2   Period Weeks   Status On-going   PT SHORT TERM GOAL #2   Title She will report able to do progression of HEP without pain   Time 4   Period Weeks   Status On-going           PT Long Term Goals - 04/09/16 1657    PT LONG TERM GOAL #1   Title She will be independent with all HEP issued   Time 8   Period Weeks   Status New   PT LONG TERM GOAL #2   Title She will be able to lift kettle bells without pain to return to previous workout.   Time 8   Period Weeks   Status New   PT LONG TERM GOAL #3   Title She will be able to tolerate ligth punching activity without pain.to begin return to using heavy bag for workout   Time 8   Period Days  Status New               Plan - 04/17/16 1235    Clinical Impression Statement Patient given scapualr strengthening as well as rotator cuff stregthening exercises. She had no pain with activity. Advance bands and consider endurance exercises for the scapular stabilizaers.    Rehab Potential Good   PT Frequency 1x / week   PT Duration 8 weeks   PT Treatment/Interventions Cryotherapy;Iontophoresis 4mg /ml Dexamethasone;Therapeutic exercise;Patient/family education;Manual techniques;Taping;Dry needling   PT Next Visit Plan Add band exercises for RTC strength and scapula strength   PT Home Exercise Plan stretch , strength prone and scapula protraction /retraction   Consulted and Agree with Plan of Care Patient      Patient will benefit from skilled therapeutic intervention in order to improve the following deficits and impairments:  Pain, Postural dysfunction, Decreased activity tolerance  Visit Diagnosis: Pain in right shoulder  Abnormal posture     Problem List Patient Active Problem List   Diagnosis Date Noted  . Elevated LFTs  04/21/2015  . AOM (acute otitis media) 01/03/2015  . Hirsutism 01/29/2013  . Acne vulgaris 01/29/2013  . Anemia 01/29/2013  . Infected nailbed of finger 01/29/2013  . MAMMOGRAM, ABNORMAL, RIGHT 03/28/2009  . MIGRAINE, COMMON 10/15/2007  . PREMATURE VENTRICULAR CONTRACTIONS 10/15/2007  . GERD 10/15/2007    Carney Living PT DPT  04/17/2016, 12:37 PM  [place] [address] [place], [place], [address] Phone: [phone]   Fax:  [phone]  Name: Taylor Cain MRN: [id number] Date of Birth: 06-24-1961

## 2016-04-18 ENCOUNTER — Ambulatory Visit
Admission: RE | Admit: 2016-04-18 | Discharge: 2016-04-18 | Disposition: A | Discharge status: Home / Self Care | Payer: 59 | Source: Ambulatory Visit | Attending: Family Medicine | Admitting: Family Medicine

## 2016-04-18 DIAGNOSIS — Z1231 Encounter for screening mammogram for malignant neoplasm of breast: Secondary | ICD-10-CM | POA: Diagnosis not present

## 2016-04-19 ENCOUNTER — Telehealth: Payer: Self-pay | Admitting: Family Medicine

## 2016-04-19 ENCOUNTER — Other Ambulatory Visit (INDEPENDENT_AMBULATORY_CARE_PROVIDER_SITE_OTHER): Payer: 59

## 2016-04-19 DIAGNOSIS — R7989 Other specified abnormal findings of blood chemistry: Secondary | ICD-10-CM

## 2016-04-19 DIAGNOSIS — D649 Anemia, unspecified: Secondary | ICD-10-CM

## 2016-04-19 DIAGNOSIS — Z13220 Encounter for screening for lipoid disorders: Secondary | ICD-10-CM

## 2016-04-19 DIAGNOSIS — R945 Abnormal results of liver function studies: Secondary | ICD-10-CM

## 2016-04-19 LAB — COMPREHENSIVE METABOLIC PANEL
ALK PHOS: 59 U/L (ref 39–117)
ALT: 26 U/L (ref 0–35)
AST: 40 U/L — AB (ref 0–37)
Albumin: 4.4 g/dL (ref 3.5–5.2)
BILIRUBIN TOTAL: 0.5 mg/dL (ref 0.2–1.2)
BUN: 23 mg/dL (ref 6–23)
CO2: 32 meq/L (ref 19–32)
Calcium: 9.6 mg/dL (ref 8.4–10.5)
Chloride: 100 mEq/L (ref 96–112)
Creatinine, Ser: 1 mg/dL (ref 0.40–1.20)
GFR: 60.68 mL/min (ref >60.00)
GLUCOSE: 84 mg/dL (ref 70–99)
Potassium: 4.5 mEq/L (ref 3.5–5.1)
Sodium: 138 mEq/L (ref 135–145)
TOTAL PROTEIN: 6.8 g/dL (ref 6.0–8.3)

## 2016-04-19 LAB — CBC WITH DIFFERENTIAL/PLATELET
BASOS ABS: 0 10*3/uL (ref 0.0–0.1)
Basophils Relative: 0.6 % (ref 0.0–3.0)
EOS ABS: 0.2 10*3/uL (ref 0.0–0.7)
Eosinophils Relative: 3.7 % (ref 0.0–5.0)
HCT: 39.5 % (ref 36.0–46.0)
Hemoglobin: 13.4 g/dL (ref 12.0–15.0)
LYMPHS ABS: 2 10*3/uL (ref 0.7–4.0)
LYMPHS PCT: 44.2 % (ref 12.0–46.0)
MCHC: 33.8 g/dL (ref 30.0–36.0)
MCV: 94.5 fl (ref 78.0–100.0)
MONOS PCT: 9.4 % (ref 3.0–12.0)
Monocytes Absolute: 0.4 10*3/uL (ref 0.1–1.0)
NEUTROS PCT: 42.1 % — AB (ref 43.0–77.0)
Neutro Abs: 1.9 10*3/uL (ref 1.4–7.7)
Platelets: 161 10*3/uL (ref 150.0–400.0)
RBC: 4.18 Mil/uL (ref 3.87–5.11)
RDW: 13.6 % (ref 11.5–15.5)
WBC: 4.6 10*3/uL (ref 4.0–10.5)

## 2016-04-19 LAB — LIPID PANEL
CHOL/HDL RATIO: 2
Cholesterol: 158 mg/dL (ref 0–200)
HDL: 66.4 mg/dL (ref >39.00)
LDL Cholesterol: 82 mg/dL (ref 0–99)
NONHDL: 92.05
Triglycerides: 48 mg/dL (ref 0.0–149.0)
VLDL: 9.6 mg/dL (ref 0.0–40.0)

## 2016-04-19 NOTE — Telephone Encounter (Signed)
-----   Message from Ellamae Sia sent at 04/11/2016 10:25 AM EDT ----- Regarding: Lab orders for Friday, 7.21.17 Patient is scheduled for CPX labs, please order future labs, Thanks , Karna Christmas

## 2016-04-22 DIAGNOSIS — M7541 Impingement syndrome of right shoulder: Secondary | ICD-10-CM | POA: Diagnosis not present

## 2016-04-23 ENCOUNTER — Ambulatory Visit: Payer: 59

## 2016-04-23 DIAGNOSIS — M25511 Pain in right shoulder: Secondary | ICD-10-CM

## 2016-04-23 DIAGNOSIS — R293 Abnormal posture: Secondary | ICD-10-CM

## 2016-04-23 NOTE — Patient Instructions (Signed)
External Rotation (Eccentric), Active - Side-Lying    Lie on side, affected arm on top, elbow bent to 90, towel under elbow. Quickly lift forearm of affected arm. Slowly lower affected arm for 3-5 seconds. __1_ reps per set, __1_ sets per day, ___ days per week. Add __3_ lbs when you achieve _30__ repetitions.  http://ecce.exer.us/189   Copyright  VHI. All rights reserved.   Also issued prone [place] exer with hor abduction with emphasis on stabilizing scapula reps as above. Also [place] with red band as above

## 2016-04-23 NOTE — Therapy (Signed)
[place], [place], [address] Phone: [phone]   Fax:  [phone]  Physical Therapy Treatment  Patient Details  Name: Taylor Cain MRN: [id number] Date of Birth: Jul 11, 1961 Referring Provider: Lacinda Axon PA  Encounter Date: 04/23/2016      PT End of Session - 04/23/16 1723    Visit Number 3   Number of Visits 8   Date for PT Re-Evaluation 06/07/16   Authorization Type UMR   PT Start Time 0432   PT Stop Time 0515   PT Time Calculation (min) 43 min   Activity Tolerance Patient tolerated treatment well   Behavior During Therapy [facility] for tasks assessed/performed      Past Medical History:  Diagnosis Date  . Anemia   . GERD (gastroesophageal reflux disease)   . Seasonal allergies     Past Surgical History:  Procedure Laterality Date  . KNEE ARTHROSCOPY Right 1990, 1992   torn acl    There were no vitals filed for this visit.      Subjective Assessment - 04/23/16 1634    Subjective She reports she has bad days and good ones . Bad day yesterday , not sure why. Used  heavier band exercise day before and was sore end of day. Saw Dr Onnie Graham yesterday. He asked if she wanted MRI .   tight posterior shoulder near spine. Shook smoothy and gave some pain  earlier.   Currently in Pain? No/denies                         [facility] Adult PT Treatment/Exercise - 04/23/16 1639      Exercises   Exercises Shoulder     Shoulder Exercises: Prone   Retraction Right;20 reps   Retraction Limitations worked on stabiliziing scapula with hor abduction lifting     Shoulder Exercises: Sidelying   External Rotation Strengthening;Right   External Rotation Weight (lbs) 3   External Rotation Limitations 30 reps     Shoulder Exercises: ROM/Strengthening   UBE (Upper Arm Bike) L1 5 miun forward   Wall Pushups 20 reps   Wall Pushups Limitations cued for posture and scapula slide over thorax.  in  push up position at counter                PT Education - 04/23/16 1723    Education provided Yes   Education Details Band exercises and scapula stabilization   Person(s) Educated Patient   Methods Explanation;Demonstration;Tactile cues;Verbal cues;Handout   Comprehension Verbalized understanding;Returned demonstration          PT Short Term Goals - 04/23/16 1725      PT SHORT TERM GOAL #1   Title She will be independent with inital HEP   Status Achieved     PT SHORT TERM GOAL #2   Title She will report able to do progression of HEP without pain   Status On-going           PT Long Term Goals - 04/09/16 1657      PT LONG TERM GOAL #1   Title She will be independent with all HEP issued   Time 8   Period Weeks   Status New     PT LONG TERM GOAL #2   Title She will be able to lift kettle bells without pain to return to previous workout.   Time 8   Period Weeks   Status New  PT LONG TERM GOAL #3   Title She will be able to tolerate ligth punching activity without pain.to begin return to using heavy bag for workout   Time 8   Period Days   Status New               Plan - 04/23/16 1724    Clinical Impression Statement Pt did all  exercise without pain today and I added rock wood and stab of scapula. She continues having bad pain at times but has some good days.    PT Treatment/Interventions Cryotherapy;Iontophoresis 4mg /ml Dexamethasone;Therapeutic exercise;Patient/family education;Manual techniques;Taping;Dry needling   PT Next Visit Plan review scapula stab and band exercises, progress as able   PT Home Exercise Plan rockwood, scap stab [place]   Consulted and Agree with Plan of Care Patient      Patient will benefit from skilled therapeutic intervention in order to improve the following deficits and impairments:  Pain, Postural dysfunction, Decreased activity tolerance  Visit Diagnosis: Pain in right shoulder  Abnormal  posture     Problem List Patient Active Problem List   Diagnosis Date Noted  . Elevated LFTs 04/21/2015  . AOM (acute otitis media) 01/03/2015  . Hirsutism 01/29/2013  . Acne vulgaris 01/29/2013  . Anemia 01/29/2013  . Infected nailbed of finger 01/29/2013  . MAMMOGRAM, ABNORMAL, RIGHT 03/28/2009  . MIGRAINE, COMMON 10/15/2007  . PREMATURE VENTRICULAR CONTRACTIONS 10/15/2007  . GERD 10/15/2007    [name]   PT 04/23/2016, 5:39 PM  [facility] [address] [place], [place], [address] Phone: [phone]   Fax:  [phone]  Name: Taylor Cain MRN: [id number] Date of Birth: [birth year]

## 2016-04-25 ENCOUNTER — Encounter: Payer: Self-pay | Admitting: Family Medicine

## 2016-04-25 ENCOUNTER — Other Ambulatory Visit (HOSPITAL_COMMUNITY)
Admission: RE | Admit: 2016-04-25 | Discharge: 2016-04-25 | Disposition: A | Discharge status: Home / Self Care | Payer: 59 | Source: Ambulatory Visit | Attending: Family Medicine | Admitting: Family Medicine

## 2016-04-25 ENCOUNTER — Ambulatory Visit (INDEPENDENT_AMBULATORY_CARE_PROVIDER_SITE_OTHER): Payer: 59 | Admitting: Family Medicine

## 2016-04-25 VITALS — BP 102/60 | HR 54 | Temp 98.5°F | Ht 66.0 in | Wt 136.5 lb

## 2016-04-25 DIAGNOSIS — Z23 Encounter for immunization: Secondary | ICD-10-CM

## 2016-04-25 DIAGNOSIS — R7989 Other specified abnormal findings of blood chemistry: Secondary | ICD-10-CM

## 2016-04-25 DIAGNOSIS — R945 Abnormal results of liver function studies: Secondary | ICD-10-CM

## 2016-04-25 DIAGNOSIS — Z1151 Encounter for screening for human papillomavirus (HPV): Secondary | ICD-10-CM | POA: Diagnosis not present

## 2016-04-25 DIAGNOSIS — Z124 Encounter for screening for malignant neoplasm of cervix: Secondary | ICD-10-CM

## 2016-04-25 DIAGNOSIS — D649 Anemia, unspecified: Secondary | ICD-10-CM | POA: Diagnosis not present

## 2016-04-25 DIAGNOSIS — Z01419 Encounter for gynecological examination (general) (routine) without abnormal findings: Secondary | ICD-10-CM | POA: Diagnosis not present

## 2016-04-25 DIAGNOSIS — Z0000 Encounter for general adult medical examination without abnormal findings: Secondary | ICD-10-CM

## 2016-04-25 MED ORDER — TETANUS-DIPHTH-ACELL PERTUSSIS 5-2.5-18.5 LF-MCG/0.5 IM SUSP
0.5000 mL | Freq: Once | INTRAMUSCULAR | Status: AC
  Administered 2016-04-25: 0.5 mL via INTRAMUSCULAR

## 2016-04-25 NOTE — Assessment & Plan Note (Signed)
Resolved

## 2016-04-25 NOTE — Progress Notes (Signed)
The patient is here for annual wellness exam and preventative care.  Currentlly seeing [facility] for right shoulder pain. Ongoing x several months, improving with PT.   Doing well overall. BP Readings from Last 3 Encounters:  04/25/16 102/60  04/21/15 106/70  01/03/15 102/70   Exercise regimen: 3-4 times a week. Biking or running. Diet; healthy paleo diet.   GERD: well controlled on no med.  Allergies, chronic seasonal, well controlled on claritin prn.  Elevated AST: improved after stopping OTC supplements. Now slightly elevated again. Hep panel negative. Drinks 2-3 glasses on weekend alcohol.  No tylenol, no family history of liver issues.  Reviewed labs in detail with pt. Lab Results  Component Value Date   CHOL 158 04/19/2016   HDL 66.40 04/19/2016   LDLCALC 82 04/19/2016   TRIG 48.0 04/19/2016   CHOLHDL 2 04/19/2016   Social History /Family History/Past Medical History reviewed and updated if needed.   Review of Systems  Constitutional: Negative for fever, fatigue and unexpected weight change.  HENT: Negative for ear pain, congestion, sore throat, sneezing, trouble swallowing and sinus pressure.  Eyes: Negative for pain and itching.  Respiratory: Negative for cough, shortness of breath and wheezing.  Cardiovascular: Negative for chest pain, palpitations and leg swelling.  Gastrointestinal:  Negative for constipation.. Negative for nausea, vomiting, abdominal pain, diarrhea, blood in stool and abdominal distention.  Genitourinary: Negative for dysuria, hematuria, vaginal discharge, difficulty urinating and menstrual problem.  Skin: Negative for rash.  Neurological: Negative for syncope, weakness, light-headedness, numbness and headaches.  Psychiatric/Behavioral: Negative for confusion and dysphoric mood. The patient is not nervous/anxious.  Objective:   Physical Exam  Constitutional: Vital signs are normal. She appears well-developed and  well-nourished. She is cooperative. Non-toxic appearance. She does not appear ill. No distress.  HENT:  Head: Normocephalic.  Right Ear: Hearing, tympanic membrane, external ear and ear canal normal.  Left Ear: Hearing, tympanic membrane, external ear and ear canal normal.  Nose: Nose normal.  Eyes: Conjunctivae, EOM and lids are normal. Pupils are equal, round, and reactive to light. No foreign bodies found.  Neck: Trachea normal and normal range of motion. Neck supple. Carotid bruit is not present. No mass and no thyromegaly present.  Cardiovascular: Normal rate, regular rhythm, S1 normal, S2 normal, normal heart sounds and intact distal pulses. Exam reveals no gallop.  No murmur heard.  Pulmonary/Chest: Effort normal and breath sounds normal. No respiratory distress. She has no wheezes. She has no rhonchi. She has no rales.  Abdominal: Soft. Normal appearance and bowel sounds are normal. She exhibits no distension, no fluid wave, no abdominal bruit and no mass. There is no hepatosplenomegaly. There is no tenderness. There is no rebound, no guarding and no CVA tenderness. No hernia.  Genitourinary:Normal introitus for age, no external lesions, no vaginal discharge, mucosa pink and moist, no vaginal or cervical lesions, no vaginal atrophy, no friaility or hemorrhage, normal uterus size and position, no adnexal masses or tenderness.  Chaperoned exam.   PAP performed. Lymphadenopathy:  She has no cervical adenopathy.  She has no axillary adenopathy.  Neurological: She is alert. She has normal strength. No cranial nerve deficit or sensory deficit.  Skin: Skin is warm, dry and intact. No rash noted.   Psychiatric: Her speech is normal and behavior is normal. Judgment normal. Her mood appears not anxious. Cognition and memory are normal. She does not exhibit a depressed mood.  Assessment & Plan:   Complete Physical Exam:  The patient's preventative  maintenance and recommended  screening tests for an annual wellness exam were reviewed in full today.  Brought up to date unless services declined.  Counselled on the importance of diet, exercise, and its role in overall health and mortality.  The patient's FH and SH was reviewed, including their home life, tobacco status, and drug and alcohol status.   Vaccines: Due for Tdap DXA nml at age 27. DXA not indicated because low risk. Start age 64-65 Mammo: nml 03/2016 PAP/DVE: Nml 2014, on q 3 year schedule for pap (due today), DVE yearly. Non smoker. Colon:   2015 Colonoscopy nml, Dr Fuller Plan. Recall in 10 years.  Hep C  Neg  HIV: refused

## 2016-04-25 NOTE — Patient Instructions (Addendum)
Decrease ETOH use to no more than 1 beverage at a time.  Keep up great work on healthy eating and regular exercise.

## 2016-04-25 NOTE — Progress Notes (Signed)
Pre visit review using our clinic review tool, if applicable. No additional management support is needed unless otherwise documented below in the visit note. 

## 2016-04-25 NOTE — Assessment & Plan Note (Signed)
Only slgith increase.. 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  Decrease ETOH use to no more than 1 beverage at a time. Doubt fatty liver given healthy  Low fat diet and nml BMI.  No family history.  Neg hep panel.  Will follow over time. If LFTS increase.. Consider liver US.

## 2016-04-25 NOTE — Addendum Note (Signed)
Addended by: Carter Kitten on: 04/25/2016 02:51 PM   Modules accepted: Orders

## 2016-04-26 LAB — CYTOLOGY - PAP

## 2016-05-07 ENCOUNTER — Ambulatory Visit: Payer: 59 | Attending: Orthopedic Surgery

## 2016-05-07 DIAGNOSIS — M25511 Pain in right shoulder: Secondary | ICD-10-CM | POA: Diagnosis not present

## 2016-05-07 DIAGNOSIS — R293 Abnormal posture: Secondary | ICD-10-CM | POA: Insufficient documentation

## 2016-05-07 NOTE — Therapy (Signed)
[place], [place], [address] Phone: [phone]   Fax:  [phone]  Physical Therapy Treatment  Patient Details  Name: Taylor Cain MRN: [id number] Date of Birth: 1961/03/26 Referring Provider: Lacinda Axon PA  Encounter Date: 05/07/2016      PT End of Session - 05/07/16 1631    Visit Number 4   Number of Visits 8   Date for PT Re-Evaluation 06/07/16   PT Start Time 0430   PT Stop Time 0515   PT Time Calculation (min) 45 min   Activity Tolerance Patient tolerated treatment well   Behavior During Therapy [facility] for tasks assessed/performed      Past Medical History:  Diagnosis Date  . Anemia   . GERD (gastroesophageal reflux disease)   . Seasonal allergies     Past Surgical History:  Procedure Laterality Date  . KNEE ARTHROSCOPY Right 1990, 1992   torn acl    There were no vitals filed for this visit.      Subjective Assessment - 05/07/16 1632    Subjective Good and bad days but can't find my triggers. Pain with IR and abduction motion   Currently in Pain? No/denies                         [facility] Adult PT Treatment/Exercise - 05/07/16 1637      Shoulder Exercises: Supine   Other Supine Exercises flys 4 pounds x 20      Shoulder Exercises: Seated   Other Seated Exercises push up seated x 20      Shoulder Exercises: Prone   Horizontal ABduction 1 --  3 sets   Horizontal ABduction 1 Weight (lbs) 4   Other Prone Exercises extension  3x10 4 pounds and ER 3 x10  4 pounds   Other Prone Exercises hor abduction with elbow at 90 degrees, press RT hand into red ball x 20 , then 15 pound kettle bell x 20  row     Shoulder Exercises: ROM/Strengthening   UBE (Upper Arm Bike) 3 moin forward and 3 min back L3                 PT Education - 05/07/16 1708    Education provided Yes   Education Details Side lye int rotation stretch   Person(s) Educated Patient   Methods Explanation;Demonstration;Tactile cues;Verbal cues;Handout   Comprehension Verbalized understanding;Returned demonstration          PT Short Term Goals - 05/07/16 1716      PT SHORT TERM GOAL #1   Title She will be independent with inital HEP   Status Achieved     PT SHORT TERM GOAL #2   Title She will report able to do progression of HEP without pain   Status Achieved           PT Long Term Goals - 05/07/16 1716      PT LONG TERM GOAL #1   Title She will be independent with all HEP issued   Status On-going     PT LONG TERM GOAL #2   Title She will be able to lift kettle bells without pain to return to previous workout.   Status On-going     PT LONG TERM GOAL #3   Title She will be able to tolerate ligth punching activity without pain.to begin return to using heavy bag for workout   Status On-going  PT LONG TERM GOAL #4   Title She will be able to lye on RT and LT side with 1-2 max pain   Time 8   Period Weeks   Status New               Plan - 05/07/16 1717    Clinical Impression Statement Some pain with lying on Lt and RT side . Doing well with all exercise without pain but cont pain with rottion and abduciton andd reaching overhead. Some pain with lying on    PT Treatment/Interventions Cryotherapy;Iontophoresis 4mg /ml Dexamethasone;Therapeutic exercise;Patient/family education;Manual techniques;Taping;Dry needling   PT Next Visit Plan review kettle bell program at gym as assess if any are appropriate to add to program, assess hip hinge   PT Home Exercise Plan IR stretch on side   Consulted and Agree with Plan of Care Patient      Patient will benefit from skilled therapeutic intervention in order to improve the following deficits and impairments:  Pain, Postural dysfunction, Decreased activity tolerance  Visit Diagnosis: Abnormal posture  Pain in right shoulder     Problem List Patient Active Problem List   Diagnosis Date Noted  .  Elevated LFTs 04/21/2015  . Acne vulgaris 01/29/2013  . Anemia 01/29/2013  . MIGRAINE, COMMON 10/15/2007  . PREMATURE VENTRICULAR CONTRACTIONS 10/15/2007  . GERD 10/15/2007    [name]  PT 05/07/2016, 5:20 PM  [facility] [address] [place], [place], [address] Phone: [phone]   Fax:  [phone]  Name: Taylor Cain MRN: [id number] Date of Birth: Apr 30, 1961

## 2016-05-16 ENCOUNTER — Ambulatory Visit: Payer: 59

## 2016-05-16 DIAGNOSIS — R293 Abnormal posture: Secondary | ICD-10-CM | POA: Diagnosis not present

## 2016-05-16 DIAGNOSIS — M25511 Pain in right shoulder: Secondary | ICD-10-CM

## 2016-05-16 NOTE — Therapy (Signed)
[place], [place], [address] Phone: [phone]   Fax:  [phone]  Physical Therapy Treatment  Patient Details  Name: Taylor Cain MRN: [id number] Date of Birth: Oct 16, 1960 Referring Provider: Lacinda Axon PA  Encounter Date: 05/16/2016      PT End of Session - 05/16/16 1632    Visit Number 5   Number of Visits 8   Date for PT Re-Evaluation 06/07/16   Authorization Type UMR   PT Start Time 0430   PT Stop Time 0515   PT Time Calculation (min) 45 min   Activity Tolerance Patient tolerated treatment well   Behavior During Therapy [facility] for tasks assessed/performed      Past Medical History:  Diagnosis Date  . Anemia   . GERD (gastroesophageal reflux disease)   . Seasonal allergies     Past Surgical History:  Procedure Laterality Date  . KNEE ARTHROSCOPY Right 1990, 1992   torn acl    There were no vitals filed for this visit.      Subjective Assessment - 05/16/16 1633    Subjective Doing better with being able to sleep on shoulder now and wakng fewer times due to pain.    Currently in Pain? No/denies   Multiple Pain Sites No                         [facility] Adult PT Treatment/Exercise - 05/16/16 1638      Shoulder Exercises: Prone   Other Prone Exercises Push ups on edge of mat and on floor with elbow extensied and protraction single arm push up x 10 RT and LT . She demo fair core stability rotating body/pelvis to asssit lift. We dis cussed core stability improtance and to work on this .    Other Prone Exercises and push up and stabolize on ball x 3 as this was too difficult.     Shoulder Exercises: Standing   Other Standing Exercises [place]  green band   x15 reps  then flexion /punch x 15 with  jab, with RT foot and LT foot forward and with upper cut  all x 15 without pain. Kettle bell 5 pounds with dead lift with bell to thighs extended elbows and diagonal lifts with  skater positions LT and RT      Shoulder Exercises: ROM/Strengthening   UBE (Upper Arm Bike) 3 min forward and 3 min back L3                 PT Education - 05/16/16 1744    Education provided Yes   Education Details REturn to gym with modifications  advice   Person(s) Educated Patient   Methods Explanation   Comprehension Verbalized understanding          PT Short Term Goals - 05/16/16 1719      PT SHORT TERM GOAL #1   Title She will be independent with inital HEP   Status Achieved     PT SHORT TERM GOAL #2   Title She will report able to do progression of HEP without pain   Status Achieved           PT Long Term Goals - 05/16/16 1637      PT LONG TERM GOAL #1   Title She will be independent with all HEP issued   Status On-going     PT LONG TERM GOAL #2   Title She will be  able to lift kettle bells without pain to return to previous workout.   Baseline cannot go above shoulder height   Status Partially Met     PT LONG TERM GOAL #3   Title She will be able to tolerate ligth punching activity without pain.to begin return to using heavy bag for workout   Baseline started punching with green band without pain   Status Partially Met     PT LONG TERM GOAL #4   Title She will be able to lye on RT and LT side with 1-2 max pain   Status Partially Met               Plan - 05/16/16 1632    Clinical Impression Statement Improving with less pain and increased tolerance to weight on shoulder  with sleep and waking fewer times per night.  She still has pain and noise with reaching overhead but less pain overall. She will return to gym with modifications   PT Treatment/Interventions Cryotherapy;Iontophoresis 64m/ml Dexamethasone;Therapeutic exercise;Patient/family education;Manual techniques;Taping;Dry needling   PT Next Visit Plan  kettle bell program for gym , assess hip hinge again, continue stabilization /strength, body blade   Consulted and Agree with  Plan of Care Patient      Patient will benefit from skilled therapeutic intervention in order to improve the following deficits and impairments:  Pain, Postural dysfunction, Decreased activity tolerance  Visit Diagnosis: Abnormal posture  Pain in right shoulder     Problem List Patient Active Problem List   Diagnosis Date Noted  . Elevated LFTs 04/21/2015  . Acne vulgaris 01/29/2013  . Anemia 01/29/2013  . MIGRAINE, COMMON 10/15/2007  . PREMATURE VENTRICULAR CONTRACTIONS 10/15/2007  . GERD 10/15/2007    CDarrel Hoover PT 05/16/2016, 5:59 PM  [facility] [address]Phone: [phone]  Fax:  [phone] Name: Taylor FIOREMRN: [id number] of Birth: 408/13/1962

## 2016-05-23 ENCOUNTER — Ambulatory Visit: Payer: 59

## 2016-05-23 DIAGNOSIS — R293 Abnormal posture: Secondary | ICD-10-CM

## 2016-05-23 DIAGNOSIS — M25511 Pain in right shoulder: Secondary | ICD-10-CM

## 2016-05-23 NOTE — Therapy (Addendum)
[place], [place], [address] Phone: [phone]   Fax:  [phone]  Physical Therapy Treatment/ Discharge  Patient Details  Name: LAURREN LEPKOWSKI MRN: [id number] Date of Birth: 1961/08/18 Referring Provider: Lacinda Axon PA  Encounter Date: 05/23/2016      PT End of Session - 05/23/16 1735    Visit Number 6   Number of Visits 8   Date for PT Re-Evaluation 06/07/16   Authorization Type UMR   PT Start Time 0430   PT Stop Time 0512   PT Time Calculation (min) 42 min   Activity Tolerance Patient tolerated treatment well   Behavior During Therapy [facility] for tasks assessed/performed      Past Medical History:  Diagnosis Date  . Anemia   . GERD (gastroesophageal reflux disease)   . Seasonal allergies     Past Surgical History:  Procedure Laterality Date  . KNEE ARTHROSCOPY Right 1990, 1992   torn acl    There were no vitals filed for this visit.      Subjective Assessment - 05/23/16 1724    Subjective Monday I began to have significant incr pain and thinks plank position may have irritated shouler but stopped for 1-2 days and did them again without increase pain though more strain to RT shoulder noted.   She is much better as she is able to use blue band without pain and she was able to punch and hit theraball   Currently in Pain? No/denies            [facility] PT Assessment - 05/23/16 0001      Strength   Right Shoulder Flexion 5/5   Right Shoulder Extension 5/5   Right Shoulder ABduction 5/5   Right Shoulder Internal Rotation 5/5   Right Shoulder External Rotation 5/5   Right Shoulder Horizontal ABduction 5/5   Right Shoulder Horizontal ADduction 5/5   Left Shoulder Flexion 5/5   Left Shoulder Extension 5/5   Left Shoulder ABduction 5/5   Left Shoulder Internal Rotation 5/5   Left Shoulder External Rotation 5/5   Left Shoulder Horizontal ABduction 5/5   Left Shoulder Horizontal  ADduction 5/5                     [facility] Adult PT Treatment/Exercise - 05/23/16 1641      Shoulder Exercises: Supine   Other Supine Exercises 15 pound kettle bell  x 5 RT , 20 x5 RT and 10 TL and 5 LT pounds for diagonal sit up in firdst phase of [place] get up, LT arm with diagonal RT was much harder for Ms [facility]     Shoulder Exercises: Standing   External Rotation Right;20 reps   Theraband Level (Shoulder External Rotation) Other (comment);Level 4 (Blue)   Internal Rotation Strengthening;Right;20 reps   Theraband Level (Shoulder Internal Rotation) Level 4 (Blue)   Flexion 20 reps   Theraband Level (Shoulder Flexion) Level 4 (Blue)   Extension Right;20 reps   Theraband Level (Shoulder Extension) Level 4 (Blue)   Row Strengthening;Right;20 reps   Theraband Level (Shoulder Row) Level 4 (Blue)     Shoulder Exercises: ROM/Strengthening   UBE (Upper Arm Bike) 3 min forward and 3 min back L3      Shoulder Exercises: Body Blade   Flexion 2 reps;30 seconds   ABduction 2 reps;30 seconds   External Rotation 2 reps;30 seconds   Internal Rotation 2 reps;30 seconds   Other Body Blade  Exercises behind back                PT Education - 05/23/16 1732    Education provided Yes   Education Details body blade, initiating punching bag work ( 50% effort and 10-12 reps to start), Blue band issued,    Person(s) Educated Patient   Methods Explanation   Comprehension Verbalized understanding          PT Short Term Goals - 05/16/16 1719      PT SHORT TERM GOAL #1   Title She will be independent with inital HEP   Status Achieved     PT SHORT TERM GOAL #2   Title She will report able to do progression of HEP without pain   Status Achieved           PT Long Term Goals - 05/23/16 1742      PT LONG TERM GOAL #1   Title She will be independent with all HEP issued   Status Partially Met     PT LONG TERM GOAL #2   Title She will be able to lift kettle bells  without pain to return to previous workout.   Baseline cannot go above shoulder height   Status Partially Met     PT LONG TERM GOAL #3   Title She will be able to tolerate ligth punching activity without pain.to begin return to using heavy bag for workout   Baseline She hit a theraball without pain except when she used her elbow in abducted /int rot position of RT shoulder   Status Partially Met     PT LONG TERM GOAL #4   Title She will be able to lye on RT and LT side with 1-2 max pain   Status Partially Met               Plan - 05/23/16 1735    Clinical Impression Statement She is much improved since eval but continue to have noise with movement overhead and pain at times with forceful movements and with movement into abduction and Int rotation of RT shoulder. She noitices more discomfort also with compression of joint  with body weight   PT Treatment/Interventions Cryotherapy;Iontophoresis 5m/ml Dexamethasone;Therapeutic exercise;Patient/family education;Manual techniques;Taping;Dry needling   PT Next Visit Plan Continue per plan of 2 more visits progressing load to shoulder and avoiding pain.   MEasure ROM next visit   Consulted and Agree with Plan of Care Patient      Patient will benefit from skilled therapeutic intervention in order to improve the following deficits and impairments:  Pain, Postural dysfunction, Decreased activity tolerance  Visit Diagnosis: Pain in right shoulder  Abnormal posture     Problem List Patient Active Problem List   Diagnosis Date Noted  . Elevated LFTs 04/21/2015  . Acne vulgaris 01/29/2013  . Anemia 01/29/2013  . MIGRAINE, COMMON 10/15/2007  . PREMATURE VENTRICULAR CONTRACTIONS 10/15/2007  . GERD 10/15/2007    CDarrel Hoover  PT 05/23/2016, 5:44 PM  [facility] [place] [address]Phone: [phone]  Fax:  [phone] Name: SBROOK GERACIMRN:  [id number] of Birth: [birth year] PHYSICAL THERAPY DISCHARGE SUMMARY  Visits from Start of Care: 6  Current functional level related to goals / functional outcomes: See above   Remaining deficits: It appears she is scheduled for surgery at end of the month of Oct 2017   Education / Equipment: HEP Plan:  Patient goals were partially met. Patient is being discharged due to not returning since the last visit.  ?????    Lillette Boxer Kerwin Augustus  PT 07/15/16         2:32 PM

## 2016-05-24 DIAGNOSIS — M7541 Impingement syndrome of right shoulder: Secondary | ICD-10-CM | POA: Diagnosis not present

## 2016-05-30 ENCOUNTER — Ambulatory Visit: Payer: 59

## 2016-05-31 ENCOUNTER — Other Ambulatory Visit (HOSPITAL_COMMUNITY): Payer: Self-pay | Admitting: Orthopedic Surgery

## 2016-05-31 DIAGNOSIS — M7541 Impingement syndrome of right shoulder: Secondary | ICD-10-CM

## 2016-06-06 ENCOUNTER — Ambulatory Visit (HOSPITAL_COMMUNITY): Payer: 59

## 2016-06-07 ENCOUNTER — Ambulatory Visit (HOSPITAL_COMMUNITY)
Admission: RE | Admit: 2016-06-07 | Discharge: 2016-06-07 | Disposition: A | Discharge status: Home / Self Care | Payer: 59 | Source: Ambulatory Visit | Attending: Orthopedic Surgery | Admitting: Orthopedic Surgery

## 2016-06-07 DIAGNOSIS — M75111 Incomplete rotator cuff tear or rupture of right shoulder, not specified as traumatic: Secondary | ICD-10-CM | POA: Diagnosis not present

## 2016-06-07 DIAGNOSIS — M7581 Other shoulder lesions, right shoulder: Secondary | ICD-10-CM | POA: Insufficient documentation

## 2016-06-07 DIAGNOSIS — M75101 Unspecified rotator cuff tear or rupture of right shoulder, not specified as traumatic: Secondary | ICD-10-CM | POA: Diagnosis not present

## 2016-06-07 DIAGNOSIS — M7541 Impingement syndrome of right shoulder: Secondary | ICD-10-CM | POA: Insufficient documentation

## 2016-06-07 MED FILL — TRIAMCINOLONE 0.1% PASTE: 0.1 | 15 days supply | Qty: 5 | Fill #1

## 2016-06-07 MED FILL — AMPICILLIN TR 500 MG CAP: 500 | 30 days supply | Qty: 60 | Fill #1

## 2016-06-14 DIAGNOSIS — M7541 Impingement syndrome of right shoulder: Secondary | ICD-10-CM | POA: Diagnosis not present

## 2016-07-18 ENCOUNTER — Encounter (HOSPITAL_COMMUNITY)
Admission: RE | Admit: 2016-07-18 | Discharge: 2016-07-18 | Disposition: A | Discharge status: Home / Self Care | Payer: 59 | Source: Ambulatory Visit | Attending: Orthopedic Surgery | Admitting: Orthopedic Surgery

## 2016-07-18 ENCOUNTER — Encounter (HOSPITAL_COMMUNITY): Payer: Self-pay

## 2016-07-18 DIAGNOSIS — Z01812 Encounter for preprocedural laboratory examination: Secondary | ICD-10-CM | POA: Diagnosis not present

## 2016-07-18 DIAGNOSIS — D649 Anemia, unspecified: Secondary | ICD-10-CM | POA: Insufficient documentation

## 2016-07-18 DIAGNOSIS — Z79899 Other long term (current) drug therapy: Secondary | ICD-10-CM | POA: Insufficient documentation

## 2016-07-18 DIAGNOSIS — R002 Palpitations: Secondary | ICD-10-CM | POA: Diagnosis not present

## 2016-07-18 DIAGNOSIS — Z01810 Encounter for preprocedural cardiovascular examination: Secondary | ICD-10-CM | POA: Diagnosis not present

## 2016-07-18 DIAGNOSIS — K219 Gastro-esophageal reflux disease without esophagitis: Secondary | ICD-10-CM | POA: Insufficient documentation

## 2016-07-18 HISTORY — DX: Unspecified osteoarthritis, unspecified site: M19.90

## 2016-07-18 HISTORY — DX: Personal history of pneumonia (recurrent): Z87.01

## 2016-07-18 HISTORY — DX: Palpitations: R00.2

## 2016-07-18 LAB — CBC
HCT: 38.5 % (ref 36.0–46.0)
Hemoglobin: 12.8 g/dL (ref 12.0–15.0)
MCH: 31.2 pg (ref 26.0–34.0)
MCHC: 33.2 g/dL (ref 30.0–36.0)
MCV: 93.9 fL (ref 78.0–100.0)
PLATELETS: 183 10*3/uL (ref 150–400)
RBC: 4.1 MIL/uL (ref 3.87–5.11)
RDW: 12.6 % (ref 11.5–15.5)
WBC: 5.4 10*3/uL (ref 4.0–10.5)

## 2016-07-18 NOTE — Pre-Procedure Instructions (Signed)
Taylor Cain  07/18/2016      [place], [place] - [address] [facility]. [address] [place] [address] Phone: [phone] Fax: [phone]  [facility], [place] [address] Phone: [phone] Fax: [phone]  [id number] [place], [place] [address] Phone: [phone] Fax: [phone]    Your procedure is scheduled on Thursday, October 26th, 2017.  Report to [facility] Admitting at 10:30 A.M.   Call this number if you have problems the morning of surgery:  [phone]   Remember:  Do not eat food or drink liquids after midnight.   Take these medicines the morning of surgery with A SIP OF WATER: None.  Stop taking: Aspirin, NSAIDS, Aleve, Naproxen, Ibuprofen, Advil, Motrin, BC's, Goodys, Fish oil, all herbal medications, and all vitamins.     Do not wear jewelry, make-up or nail polish.  Do not wear lotions, powders, or perfumes, or deoderant.  Do not shave 48 hours prior to surgery.    Do not bring valuables to the hospital.  [facility] is not responsible for any belongings or valuables.  Contacts, dentures or bridgework may not be worn into surgery.  Leave your suitcase in the car.  After surgery it may be brought to your room.  For patients admitted to the hospital, discharge time will be determined by your treatment team.  Patients discharged the day of surgery will not be allowed to drive home.   Special instructions:  Preparing for Surgery.   Please read over the following fact sheets that you were given.   [place]- Preparing For Surgery  Before surgery, you can play an important role. Because skin is not sterile, your skin needs to be as free of germs as possible. You can reduce the number of germs on your skin by washing with CHG (chlorahexidine gluconate)  Soap before surgery.  CHG is an antiseptic cleaner which kills germs and bonds with the skin to continue killing germs even after washing.  Please do not use if you have an allergy to CHG or antibacterial soaps. If your skin becomes reddened/irritated stop using the CHG.  Do not shave (including legs and underarms) for at least 48 hours prior to first CHG shower. It is OK to shave your face.  Please follow these instructions carefully.   1. Shower the NIGHT BEFORE SURGERY and the MORNING OF SURGERY with CHG.   2. If you chose to wash your hair, wash your hair first as usual with your normal shampoo.  3. After you shampoo, rinse your hair and body thoroughly to remove the shampoo.  4. Use CHG as you would any other liquid soap. You can apply CHG directly to the skin and wash gently with a scrungie or a clean washcloth.   5. Apply the CHG Soap to your body ONLY FROM THE NECK DOWN.  Do not use on open wounds or open sores. Avoid contact with your eyes, ears, mouth and genitals (private parts). Wash genitals (private parts) with your normal soap.  6. Wash thoroughly, paying special attention to the area where your surgery will be performed.  7. Thoroughly rinse your body with warm water from the neck down.  8. DO NOT shower/wash with your normal soap after using and rinsing off the CHG Soap.  9. Pat yourself dry with  a CLEAN TOWEL.   10. Wear CLEAN PAJAMAS   11. Place CLEAN SHEETS on your bed the night of your first shower and DO NOT SLEEP WITH PETS.  Day of Surgery: Do not apply any deodorants/lotions. Please wear clean clothes to the hospital/surgery center.

## 2016-07-18 NOTE — Progress Notes (Signed)
PCP - Dr. Eliezer Lofts Cardiologist - denies  EKG - 07/18/16 CXR - denies  Echo/stress test/cardiac cath - denies  Patient denies chest pain and shortness of breath at PAT appointment.  Patient states that she has infrequent heart palpitations but remain asymptomatic.  She informed nurse that she has had these palpitations since she was in her 52's-30's.

## 2016-07-19 MED FILL — traMADol HCL 50 MG TABS: 50 | 15 days supply | Qty: 30 | Fill #0

## 2016-07-19 NOTE — Progress Notes (Signed)
Anesthesia Chart Review:  Pt is a 55 year old female scheduled for R shoulder arthroscopy with rotator cuff repair and subacromial decompression, distal clavicle resection on 07/25/2016 with Justice Britain, MD.   PCP is Eliezer Lofts, MD.   PMH includes:  Palpitations (20-30 years ago), anemia, GERD. Never smoker. BMI 22  Medications include: iron, potassium  Preoperative labs reviewed.    EKG 07/18/16: NSR. Nonspecific ST abnormality  If no changes, I anticipate pt can proceed with surgery as scheduled.   Willeen Cass, FNP-BC [facility]/Anesthesiology Phone: [phone] 07/19/2016 12:46 PM

## 2016-07-23 MED FILL — CLINDAMYCIN PH 1% GEL: 1 | 30 days supply | Qty: 60 | Fill #0

## 2016-07-23 MED FILL — AMOXICILLIN 500 MG CAPSULE: 500 | 30 days supply | Qty: 60 | Fill #0

## 2016-07-25 ENCOUNTER — Ambulatory Visit (HOSPITAL_COMMUNITY)
Admission: RE | Admit: 2016-07-25 | Discharge: 2016-07-25 | Disposition: A | Discharge status: Home / Self Care | Payer: 59 | Source: Ambulatory Visit | Attending: Orthopedic Surgery | Admitting: Orthopedic Surgery

## 2016-07-25 ENCOUNTER — Ambulatory Visit (HOSPITAL_COMMUNITY): Payer: 59 | Admitting: Anesthesiology

## 2016-07-25 ENCOUNTER — Encounter (HOSPITAL_COMMUNITY)
Admission: RE | Disposition: A | Discharge status: Home / Self Care | Payer: Self-pay | Source: Ambulatory Visit | Attending: Orthopedic Surgery

## 2016-07-25 ENCOUNTER — Encounter (HOSPITAL_COMMUNITY): Payer: Self-pay | Admitting: Anesthesiology

## 2016-07-25 DIAGNOSIS — Z8249 Family history of ischemic heart disease and other diseases of the circulatory system: Secondary | ICD-10-CM | POA: Diagnosis not present

## 2016-07-25 DIAGNOSIS — Z833 Family history of diabetes mellitus: Secondary | ICD-10-CM | POA: Diagnosis not present

## 2016-07-25 DIAGNOSIS — K219 Gastro-esophageal reflux disease without esophagitis: Secondary | ICD-10-CM | POA: Insufficient documentation

## 2016-07-25 DIAGNOSIS — R600 Localized edema: Secondary | ICD-10-CM | POA: Diagnosis not present

## 2016-07-25 DIAGNOSIS — M7541 Impingement syndrome of right shoulder: Secondary | ICD-10-CM | POA: Diagnosis not present

## 2016-07-25 DIAGNOSIS — S43431D Superior glenoid labrum lesion of right shoulder, subsequent encounter: Secondary | ICD-10-CM | POA: Diagnosis not present

## 2016-07-25 DIAGNOSIS — G8918 Other acute postprocedural pain: Secondary | ICD-10-CM | POA: Diagnosis not present

## 2016-07-25 DIAGNOSIS — Z885 Allergy status to narcotic agent status: Secondary | ICD-10-CM | POA: Diagnosis not present

## 2016-07-25 DIAGNOSIS — Z791 Long term (current) use of non-steroidal anti-inflammatories (NSAID): Secondary | ICD-10-CM | POA: Diagnosis not present

## 2016-07-25 DIAGNOSIS — M19011 Primary osteoarthritis, right shoulder: Secondary | ICD-10-CM | POA: Diagnosis not present

## 2016-07-25 DIAGNOSIS — G8929 Other chronic pain: Secondary | ICD-10-CM | POA: Insufficient documentation

## 2016-07-25 DIAGNOSIS — M25511 Pain in right shoulder: Secondary | ICD-10-CM | POA: Insufficient documentation

## 2016-07-25 DIAGNOSIS — M75121 Complete rotator cuff tear or rupture of right shoulder, not specified as traumatic: Secondary | ICD-10-CM | POA: Diagnosis not present

## 2016-07-25 DIAGNOSIS — M75101 Unspecified rotator cuff tear or rupture of right shoulder, not specified as traumatic: Secondary | ICD-10-CM | POA: Diagnosis not present

## 2016-07-25 HISTORY — PX: SHOULDER ARTHROSCOPY WITH ROTATOR CUFF REPAIR AND SUBACROMIAL DECOMPRESSION: SHX5686

## 2016-07-25 SURGERY — SHOULDER ARTHROSCOPY WITH ROTATOR CUFF REPAIR AND SUBACROMIAL DECOMPRESSION
Anesthesia: General | Site: Shoulder | Laterality: Right

## 2016-07-25 MED ORDER — ROCURONIUM BROMIDE 100 MG/10ML IV SOLN
INTRAVENOUS | Status: DC | PRN
  Administered 2016-07-25: 50 mg via INTRAVENOUS

## 2016-07-25 MED ORDER — OXYCODONE-ACETAMINOPHEN 5-325 MG PO TABS: 1.0000 | ORAL_TABLET | ORAL | 0 refills | Status: DC | PRN

## 2016-07-25 MED ORDER — EPHEDRINE 5 MG/ML INJ
INTRAVENOUS | Status: AC
  Filled 2016-07-25: qty 10

## 2016-07-25 MED ORDER — FENTANYL CITRATE (PF) 100 MCG/2ML IJ SOLN
INTRAMUSCULAR | Status: AC
  Filled 2016-07-25: qty 2

## 2016-07-25 MED ORDER — DIAZEPAM 5 MG PO TABS: 5.0000 mg | ORAL_TABLET | Freq: Once | ORAL | Status: DC

## 2016-07-25 MED ORDER — DEXAMETHASONE SODIUM PHOSPHATE 10 MG/ML IJ SOLN
INTRAMUSCULAR | Status: AC
  Filled 2016-07-25: qty 1

## 2016-07-25 MED ORDER — KETOROLAC TROMETHAMINE 30 MG/ML IJ SOLN: 30.0000 mg | Freq: Once | INTRAMUSCULAR | Status: DC

## 2016-07-25 MED ORDER — PROPOFOL 10 MG/ML IV BOLUS
INTRAVENOUS | Status: DC | PRN
  Administered 2016-07-25: 140 mg via INTRAVENOUS

## 2016-07-25 MED ORDER — MIDAZOLAM HCL 2 MG/2ML IJ SOLN
INTRAMUSCULAR | Status: AC
  Administered 2016-07-25: 2 mg
  Filled 2016-07-25: qty 2

## 2016-07-25 MED ORDER — ONDANSETRON HCL 4 MG/2ML IJ SOLN
INTRAMUSCULAR | Status: DC | PRN
  Administered 2016-07-25: 4 mg via INTRAVENOUS

## 2016-07-25 MED ORDER — MIDAZOLAM HCL 2 MG/2ML IJ SOLN
INTRAMUSCULAR | Status: AC
  Filled 2016-07-25: qty 2

## 2016-07-25 MED ORDER — DEXAMETHASONE SODIUM PHOSPHATE 10 MG/ML IJ SOLN
INTRAMUSCULAR | Status: DC | PRN
  Administered 2016-07-25: 5 mg via INTRAVENOUS

## 2016-07-25 MED ORDER — PROPOFOL 10 MG/ML IV BOLUS
INTRAVENOUS | Status: AC
  Filled 2016-07-25: qty 20

## 2016-07-25 MED ORDER — NAPROXEN 500 MG PO TABS: 500.0000 mg | ORAL_TABLET | Freq: Two times a day (BID) | ORAL | 1 refills | Status: DC

## 2016-07-25 MED ORDER — ONDANSETRON HCL 4 MG/2ML IJ SOLN
4.0000 mg | Freq: Once | INTRAMUSCULAR | Status: DC | PRN
Start: 2016-07-25 — End: 2016-07-27

## 2016-07-25 MED ORDER — SODIUM CHLORIDE 0.9 % IR SOLN
Status: DC | PRN
  Administered 2016-07-25 (×2): 6000 mL

## 2016-07-25 MED ORDER — DIAZEPAM 5 MG PO TABS: 2.5000 mg | ORAL_TABLET | Freq: Four times a day (QID) | ORAL | 1 refills | Status: DC | PRN

## 2016-07-25 MED ORDER — FENTANYL CITRATE (PF) 100 MCG/2ML IJ SOLN
INTRAMUSCULAR | Status: AC
  Administered 2016-07-25: 100 ug
  Filled 2016-07-25: qty 2

## 2016-07-25 MED ORDER — OXYCODONE HCL 5 MG PO TABS
ORAL_TABLET | ORAL | Status: AC
  Administered 2016-07-25: 5 mg
  Filled 2016-07-25: qty 1

## 2016-07-25 MED ORDER — CHLORHEXIDINE GLUCONATE 4 % EX LIQD: 60.0000 mL | Freq: Once | CUTANEOUS | Status: DC

## 2016-07-25 MED ORDER — LACTATED RINGERS IV SOLN
INTRAVENOUS | Status: DC
  Administered 2016-07-25: 12:00:00 via INTRAVENOUS

## 2016-07-25 MED ORDER — FENTANYL CITRATE (PF) 100 MCG/2ML IJ SOLN
INTRAMUSCULAR | Status: DC | PRN
  Administered 2016-07-25: 100 ug via INTRAVENOUS

## 2016-07-25 MED ORDER — ONDANSETRON HCL 4 MG PO TABS: 4.0000 mg | ORAL_TABLET | Freq: Three times a day (TID) | ORAL | 0 refills | Status: DC | PRN

## 2016-07-25 MED ORDER — OXYCODONE HCL 5 MG PO TABS: 5.0000 mg | ORAL_TABLET | Freq: Once | ORAL | Status: DC

## 2016-07-25 MED ORDER — DIAZEPAM 5 MG PO TABS
ORAL_TABLET | ORAL | Status: AC
  Administered 2016-07-25: 5 mg
  Filled 2016-07-25: qty 1

## 2016-07-25 MED ORDER — SUGAMMADEX SODIUM 200 MG/2ML IV SOLN
INTRAVENOUS | Status: AC
  Filled 2016-07-25: qty 2

## 2016-07-25 MED ORDER — CEFAZOLIN SODIUM-DEXTROSE 2-4 GM/100ML-% IV SOLN
2.0000 g | INTRAVENOUS | Status: AC
  Administered 2016-07-25: 2 g via INTRAVENOUS
  Filled 2016-07-25: qty 100

## 2016-07-25 MED ORDER — SUGAMMADEX SODIUM 200 MG/2ML IV SOLN
INTRAVENOUS | Status: DC | PRN
  Administered 2016-07-25: 100 mg via INTRAVENOUS

## 2016-07-25 MED ORDER — KETOROLAC TROMETHAMINE 30 MG/ML IJ SOLN
INTRAMUSCULAR | Status: AC
  Administered 2016-07-25: 30 mg
  Filled 2016-07-25: qty 1

## 2016-07-25 MED ORDER — FENTANYL CITRATE (PF) 100 MCG/2ML IJ SOLN: INTRAMUSCULAR | Status: DC | PRN

## 2016-07-25 MED ORDER — PHENYLEPHRINE HCL 10 MG/ML IJ SOLN
INTRAVENOUS | Status: DC | PRN
  Administered 2016-07-25: 10 ug/min via INTRAVENOUS

## 2016-07-25 MED ORDER — FENTANYL CITRATE (PF) 100 MCG/2ML IJ SOLN
25.0000 ug | INTRAMUSCULAR | Status: DC | PRN
  Administered 2016-07-25: 25 ug via INTRAVENOUS

## 2016-07-25 MED ORDER — LIDOCAINE HCL (CARDIAC) 20 MG/ML IV SOLN
INTRAVENOUS | Status: DC | PRN
  Administered 2016-07-25: 60 mg via INTRAVENOUS
  Administered 2016-07-25: 20 mg via INTRAVENOUS

## 2016-07-25 MED ORDER — ONDANSETRON HCL 4 MG/2ML IJ SOLN
INTRAMUSCULAR | Status: AC
  Filled 2016-07-25: qty 2

## 2016-07-25 MED ORDER — EPHEDRINE SULFATE-NACL 50-0.9 MG/10ML-% IV SOSY
PREFILLED_SYRINGE | INTRAVENOUS | Status: DC | PRN
  Administered 2016-07-25: 5 mg via INTRAVENOUS

## 2016-07-25 MED FILL — diazePAM 5 MG TABS: 5 | 10 days supply | Qty: 40 | Fill #0

## 2016-07-25 MED FILL — NAPROXEN 500 MG TABLET: 500 | 30 days supply | Qty: 60 | Fill #0

## 2016-07-25 MED FILL — OXYCODONE W/APAP 5/325 TAB: 5-325 | 4 days supply | Qty: 40 | Fill #0

## 2016-07-25 MED FILL — ONDANSETRON HCL 4 MG TABLET: 4 | 7 days supply | Qty: 20 | Fill #0

## 2016-07-25 SURGICAL SUPPLY — 63 items
ANCH SUT SWLK 19.1X4.75 VT (Anchor) ×6 IMPLANT
ANCHOR PEEK 4.75X19.1 SWLK C (Anchor) ×9 IMPLANT
BLADE CUTTER GATOR 3.5 (BLADE) ×4 IMPLANT
BLADE GREAT WHITE 4.2 (BLADE) ×3 IMPLANT
BLADE GREAT WHITE 4.2MM (BLADE) ×1
BLADE SURG 11 STRL SS (BLADE) ×4 IMPLANT
BOOTCOVER CLEANROOM LRG (PROTECTIVE WEAR) ×2 IMPLANT
BUR OVAL 4.0 (BURR) ×4 IMPLANT
CANISTER SUCT LVC 12 LTR MEDI- (MISCELLANEOUS) ×4 IMPLANT
CANNULA ACUFLEX KIT 5X76 (CANNULA) ×4 IMPLANT
CANNULA DRILOCK 5.0MMX75MM (CANNULA) ×1
CANNULA DRILOCK 5.0X75 (CANNULA) ×2 IMPLANT
CANNULA TWIST IN 8.25X7CM (CANNULA) ×3 IMPLANT
CLOSURE WOUND 1/2 X4 (GAUZE/BANDAGES/DRESSINGS) ×1
CONNECTOR 5 IN 1 STRAIGHT STRL (MISCELLANEOUS) ×4 IMPLANT
DRAPE INCISE 23X17 IOBAN STRL (DRAPES)
DRAPE INCISE 23X17 STRL (DRAPES) IMPLANT
DRAPE INCISE IOBAN 23X17 STRL (DRAPES) IMPLANT
DRAPE INCISE IOBAN 66X45 STRL (DRAPES) ×4 IMPLANT
DRAPE ORTHO SPLIT 77X108 STRL (DRAPES) ×8
DRAPE STERI 35X30 U-POUCH (DRAPES) ×4 IMPLANT
DRAPE SURG 17X11 SM STRL (DRAPES) ×4 IMPLANT
DRAPE SURG ORHT 6 SPLT 77X108 (DRAPES) ×4 IMPLANT
DRAPE U-SHAPE 47X51 STRL (DRAPES) ×4 IMPLANT
DRSG PAD ABDOMINAL 8X10 ST (GAUZE/BANDAGES/DRESSINGS) ×5 IMPLANT
DURAPREP 26ML APPLICATOR (WOUND CARE) ×5 IMPLANT
GAUZE SPONGE 4X4 12PLY STRL (GAUZE/BANDAGES/DRESSINGS) ×4 IMPLANT
GLOVE BIO SURGEON STRL SZ7.5 (GLOVE) ×4 IMPLANT
GLOVE BIO SURGEON STRL SZ8 (GLOVE) ×4 IMPLANT
GLOVE EUDERMIC 7 POWDERFREE (GLOVE) ×4 IMPLANT
GLOVE SS BIOGEL STRL SZ 7.5 (GLOVE) ×2 IMPLANT
GLOVE SUPERSENSE BIOGEL SZ 7.5 (GLOVE) ×2
GOWN STRL REUS W/ TWL LRG LVL3 (GOWN DISPOSABLE) ×2 IMPLANT
GOWN STRL REUS W/ TWL XL LVL3 (GOWN DISPOSABLE) ×4 IMPLANT
GOWN STRL REUS W/TWL LRG LVL3 (GOWN DISPOSABLE) ×4
GOWN STRL REUS W/TWL XL LVL3 (GOWN DISPOSABLE) ×8
KIT BASIN OR (CUSTOM PROCEDURE TRAY) ×4 IMPLANT
KIT ROOM TURNOVER OR (KITS) ×4 IMPLANT
KIT SHOULDER TRACTION (DRAPES) ×4 IMPLANT
MANIFOLD NEPTUNE II (INSTRUMENTS) ×4 IMPLANT
NDL SCORPION MULTI FIRE (NEEDLE) IMPLANT
NDL SPNL 18GX3.5 QUINCKE PK (NEEDLE) ×1 IMPLANT
NEEDLE SCORPION MULTI FIRE (NEEDLE) ×4 IMPLANT
NEEDLE SPNL 18GX3.5 QUINCKE PK (NEEDLE) ×4 IMPLANT
NS IRRIG 1000ML POUR BTL (IV SOLUTION) ×4 IMPLANT
PACK SHOULDER (CUSTOM PROCEDURE TRAY) ×4 IMPLANT
PAD ARMBOARD 7.5X6 YLW CONV (MISCELLANEOUS) ×8 IMPLANT
SET ARTHROSCOPY TUBING (MISCELLANEOUS) ×4
SET ARTHROSCOPY TUBING LN (MISCELLANEOUS) ×2 IMPLANT
SLING ARM FOAM STRAP LRG (SOFTGOODS) ×3 IMPLANT
SLING ARM LRG ADULT FOAM STRAP (SOFTGOODS) IMPLANT
SLING ARM MED ADULT FOAM STRAP (SOFTGOODS) ×1 IMPLANT
SPONGE LAP 4X18 X RAY DECT (DISPOSABLE) IMPLANT
STRIP CLOSURE SKIN 1/2X4 (GAUZE/BANDAGES/DRESSINGS) ×3 IMPLANT
SUT MNCRL AB 3-0 PS2 18 (SUTURE) ×4 IMPLANT
SUT PDS AB 0 CT 36 (SUTURE) ×3 IMPLANT
SUT TIGER TAPE 7 IN WHITE (SUTURE) ×3 IMPLANT
TAPE FIBER 2MM 7IN #2 BLUE (SUTURE) IMPLANT
TAPE PAPER 3X10 WHT MICROPORE (GAUZE/BANDAGES/DRESSINGS) ×4 IMPLANT
TOWEL OR 17X24 6PK STRL BLUE (TOWEL DISPOSABLE) ×4 IMPLANT
TOWEL OR 17X26 10 PK STRL BLUE (TOWEL DISPOSABLE) ×4 IMPLANT
WAND SUCTION MAX 4MM 90S (SURGICAL WAND) ×4 IMPLANT
WATER STERILE IRR 1000ML POUR (IV SOLUTION) ×4 IMPLANT

## 2016-07-25 NOTE — Op Note (Signed)
07/25/2016  2:30 PM  PATIENT:   Taylor Cain  55 y.o. female  PRE-OPERATIVE DIAGNOSIS:  RIGHT SHOULDER IMPINGEMENT, AC JOINT ARTHRITIS, ROTATOR CUFF TEAR  POST-OPERATIVE DIAGNOSIS:  Same with labral tear  PROCEDURE:  RSA, labral debridement, SAD, DCR, RCR  SURGEON:  Abijah Roussel, Metta Clines M.D.  ASSISTANTS: Shuford pac   ANESTHESIA:   GET + ISB  EBL: min  SPECIMEN:  none  Drains: none   PATIENT DISPOSITION:  PACU - hemodynamically stable.    PLAN OF CARE: Discharge to home after PACU  Dictation# [id number]   Contact # [phone]

## 2016-07-25 NOTE — Anesthesia Procedure Notes (Signed)
Anesthesia Regional Block:  Interscalene brachial plexus block  Pre-Anesthetic Checklist: ,, timeout performed, Correct Patient, Correct Site, Correct Laterality, Correct Procedure, Correct Position, site marked, Risks and benefits discussed,  Surgical consent,  Pre-op evaluation,  At surgeon's request and post-op pain management  Laterality: Right  Prep: chloraprep       Needles:  Injection technique: Single-shot  Needle Type: Stimulator Needle - 80      Needle Gauge: 22 and 22 G    Additional Needles:  Procedures: ultrasound guided (picture in chart) Interscalene brachial plexus block Narrative:  Start time: 07/25/2016 12:10 PM End time: 07/25/2016 12:15 PM Injection made incrementally with aspirations every 5 mL.  Performed by: Personally   Additional Notes: 20 cc 0.5% naropin injected easily

## 2016-07-25 NOTE — Anesthesia Postprocedure Evaluation (Signed)
Anesthesia Post Note  Patient: Taylor Cain  Procedure(s) Performed: Procedure(s) (LRB): RIGHT SHOULDER ARTHROSCOPY WITH ROTATOR CUFF REPAIR AND SUBACROMIAL DECOMPRESSION, DISTAL CLAVICLE RESECTION (Right)  Patient location during evaluation: PACU Anesthesia Type: General and Regional Level of consciousness: awake, awake and alert and oriented Pain management: pain level controlled Respiratory status: spontaneous breathing, nonlabored ventilation and respiratory function stable Cardiovascular status: blood pressure returned to baseline Anesthetic complications: no    Last Vitals:  Vitals:   07/25/16 1543 07/25/16 1601  BP:  117/69  Pulse: (!) 45 (!) 50  Resp: 15   Temp: 36.1 C     Last Pain:  Vitals:   07/25/16 1601  TempSrc:   PainSc: 0-No pain                 Edwyn Inclan COKER

## 2016-07-25 NOTE — Anesthesia Preprocedure Evaluation (Addendum)
Anesthesia Evaluation  Patient identified by MRN, date of birth, ID band Patient awake    Reviewed: Allergy & Precautions, NPO status , Patient's Chart, lab work & pertinent test results  Airway Mallampati: I   Neck ROM: Full    Dental  (+) Teeth Intact, Dental Advisory Given   Pulmonary    breath sounds clear to auscultation       Cardiovascular  Rhythm:Regular Rate:Normal     Neuro/Psych    GI/Hepatic   Endo/Other    Renal/GU      Musculoskeletal   Abdominal   Peds  Hematology   Anesthesia Other Findings   Reproductive/Obstetrics                            Anesthesia Physical Anesthesia Plan  ASA: I  Anesthesia Plan: General   Post-op Pain Management: GA combined w/ Regional for post-op pain   Induction: Intravenous  Airway Management Planned: Oral ETT  Additional Equipment:   Intra-op Plan:   Post-operative Plan: Extubation in OR  Informed Consent: I have reviewed the patients History and Physical, chart, labs and discussed the procedure including the risks, benefits and alternatives for the proposed anesthesia with the patient or authorized representative who has indicated his/her understanding and acceptance.   Dental advisory given  Plan Discussed with: CRNA and Anesthesiologist  Anesthesia Plan Comments:         Anesthesia Quick Evaluation

## 2016-07-25 NOTE — H&P (Signed)
Taylor Cain    Chief Complaint: RIGHT SHOULDER IMPINGEMENT, AC JOINT ARTHRITIS, ROTATOR CUFF TEAR HPI: The patient is a 55 y.o. female with chronic right shoulder pain and impingement with symptoms refractory to conservative management.  Past Medical History:  Diagnosis Date  . Anemia   . Arthritis    right shoulder  . GERD (gastroesophageal reflux disease)   . Heart palpitations    made aware when patient was in 20's-30's; states they are very infrequent and pt. remains asymptomatic  . History of pneumonia   . Seasonal allergies     Past Surgical History:  Procedure Laterality Date  . COLONOSCOPY    . KNEE ARTHROSCOPY Right 1990, 1992   torn acl    Family History  Problem Relation Age of Onset  . Migraines Mother   . Hypertension Mother   . Hypertension Maternal Grandmother   . Alzheimer's disease Maternal Grandmother   . Diabetes Maternal Grandfather   . Colon cancer Neg Hx     Social History:  reports that she has never smoked. She has never used smokeless tobacco. She reports that she drinks about 1.8 oz of alcohol per week . She reports that she does not use drugs.   Medications Prior to Admission  Medication Sig Dispense Refill  . ampicillin (PRINCIPEN) 500 MG capsule Take 500 mg by mouth 2 (two) times daily.    . B Complex-C (B-COMPLEX WITH VITAMIN C) tablet Take 1 tablet by mouth daily.    . Cholecalciferol (D3 SUPER STRENGTH) 2000 UNITS CAPS Take 2,000 Units by mouth daily.     . clindamycin (CLINDAGEL) 1 % gel Apply 1 application topically 2 (two) times daily.     . Ferrous Sulfate (IRON) 325 (65 FE) MG TABS Take 325 mg by mouth 2 (two) times daily.     . Magnesium 500 MG CAPS Take 500 mg by mouth daily.     . Menthol, Topical Analgesic, (BIOFREEZE EX) Apply 1 application topically 4 (four) times daily as needed (For shoulder pain.).     Marland Kitchen naproxen sodium (ALEVE) 220 MG tablet Take 440 mg by mouth every evening.     . Potassium 99 MG TABS Take 99 mg by  mouth daily.    . pseudoephedrine (SUDAFED) 60 MG tablet Take 60 mg by mouth daily as needed for congestion.    . vitamin B-12 (CYANOCOBALAMIN) 1000 MCG tablet Take 1,000 mcg by mouth daily.       Physical Exam: right shoulder with painful motion and exam as noted at recent office visits  Vitals  Temp:  [98.3 F (36.8 C)] 98.3 F (36.8 C) (10/26 1050) Pulse Rate:  [58] 58 (10/26 1050) Resp:  [18] 18 (10/26 1050) BP: (110)/(52) 110/52 (10/26 1050) SpO2:  [99 %] 99 % (10/26 1050) Weight:  [61.7 kg (136 lb)] 61.7 kg (136 lb) (10/26 1050)  Assessment/Plan  Impression: RIGHT SHOULDER IMPINGEMENT, AC JOINT ARTHRITIS, ROTATOR CUFF TEAR  Plan of Action: Procedure(s): RIGHT SHOULDER ARTHROSCOPY WITH ROTATOR CUFF REPAIR AND SUBACROMIAL DECOMPRESSION RESECTION DISTAL CLAVICAL  Phong Isenberg M Elimelech Houseman 07/25/2016, 11:51 AM Contact # [phone]

## 2016-07-25 NOTE — Discharge Instructions (Signed)
° °  Kevin M. Supple, M.D., F.A.A.O.S. °Orthopaedic Surgery °Specializing in Arthroscopic and Reconstructive °Surgery of the Shoulder and Knee °[phone] °[address] - [place], [place] [address] - Fax [phone] ° °POST-OP SHOULDER ARTHROSCOPIC ROTATOR CUFF  REPAIR INSTRUCTIONS ° °1. Call the office at [phone] to schedule your first post-op appointment 7-10 days from the date of your surgery. ° °2. Leave the steri-strips in place over your incisions when performing dressing changes and showering. You may remove your dressings and begin showering 72 hours from surgery. You can expect drainage that is clear to bloody in nature that occasionally will soak through your dressings. If this occurs go ahead and perform a dressing change. The drainage should lessen daily and when there is no drainage from your incisions feel free to go without a dressing. ° °3. Wear your sling/immobilizer at all times except to perform the exercises below or to occasionally let your arm dangle by your side to stretch your elbow. You also need to sleep in your sling immobilizer until instructed otherwise. ° °4. Range of motion to your elbow, wrist, and hand are encouraged 3-5 times daily. Exercise to your hand and fingers helps to reduce swelling you may experience. ° °5. Utilize ice to the shoulder 3-4 times minimum a day and additionally if you are experiencing pain. ° °6. You may one-armed drive when safely off of narcotics and muscle relaxants. You may use your hand that is in the sling to support the steering wheel only. However, should it be your right arm that is in the sling it is not to be used for gear shifting in a manual transmission. ° °7. If you had a block pre-operatively to provide post-op pain relief you may want to go ahead and begin utilizing your pain meds as your arm begins to wake up. Blocks can sometimes last up to 16-18 hours. If you are still pain-free prior to going to bed you may want to  strongly consider taking a pain medication to avoid being awakened in the night with the onset of pain. A muscle relaxant is also provided for you should you experience muscle spasms. It is recommended that if you are experiencing pain that your pain medication alone is not controlling, add the muscle relaxant along with the pain medication which can give additional pain relief. The first one to two days is generally the most severe of your pain and then should gradually decrease. As your pain lessens it is recommended that you decrease your use of the pain medications to an "as needed basis" only and to always comply with the recommended dosages of the pain medications. ° °8. Pain medications can produce constipation along with their use. If you experience this, the use of an over the counter stool softener or laxative daily is recommended.  ° °9. For additional questions or concerns, please do not hesitate to call the office. If after hours there is an answering service to forward your concerns to the physician on call. ° °POST-OP EXERCISES ° °Pendulum Exercises ° °Perform pendulum exercises while standing and bending at the waist. Support your uninvolved arm on a table or chair and allow your operated arm to hang freely. Make sure to do these exercises passively - not using you shoulder muscle. ° °Repeat 20 times. Do 3 sessions per day. ° ° °

## 2016-07-25 NOTE — Transfer of Care (Signed)
Immediate Anesthesia Transfer of Care Note  Patient: Taylor Cain  Procedure(s) Performed: Procedure(s): RIGHT SHOULDER ARTHROSCOPY WITH ROTATOR CUFF REPAIR AND SUBACROMIAL DECOMPRESSION, DISTAL CLAVICLE RESECTION (Right)  Patient Location: PACU  Anesthesia Type:GA combined with regional for post-op pain  Level of Consciousness: awake, alert  and oriented  Airway & Oxygen Therapy: Patient Spontanous Breathing and Patient connected to nasal cannula oxygen  Post-op Assessment: Report given to RN, Post -op Vital signs reviewed and stable and Patient moving all extremities  Post vital signs: Reviewed and stable  Last Vitals:  Vitals:   07/25/16 1200 07/25/16 1205  BP:    Pulse: (!) 59 (!) 48  Resp: (!) 9 11  Temp:      Last Pain:  Vitals:   07/25/16 1455  TempSrc:   PainSc: (P) 0-No pain         Complications: No apparent anesthesia complications

## 2016-07-25 NOTE — Anesthesia Procedure Notes (Signed)
Procedure Name: Intubation Date/Time: 07/25/2016 12:58 PM Performed by: Rush Farmer E Pre-anesthesia Checklist: Patient identified, Emergency Drugs available, Suction available and Patient being monitored Patient Re-evaluated:Patient Re-evaluated prior to inductionOxygen Delivery Method: Circle system utilized Preoxygenation: Pre-oxygenation with 100% oxygen Intubation Type: IV induction Ventilation: Mask ventilation without difficulty Laryngoscope Size: Mac and 3 Grade View: Grade I Tube type: Oral Tube size: 7.0 mm Airway Equipment and Method: Stylet and LTA kit utilized Placement Confirmation: ETT inserted through vocal cords under direct vision,  positive ETCO2 and breath sounds checked- equal and bilateral Secured at: 20 cm Tube secured with: Tape Dental Injury: Teeth and Oropharynx as per pre-operative assessment and Injury to lip

## 2016-07-26 ENCOUNTER — Encounter (HOSPITAL_COMMUNITY): Payer: Self-pay | Admitting: Orthopedic Surgery

## 2016-07-26 NOTE — Op Note (Signed)
Taylor Cain, Taylor Cain                ACCOUNT NO.:  [id number]  MEDICAL RECORD NO.:  [id number]  LOCATION:  [facility]                         FACILITY:  [facility]  PHYSICIAN:  Taylor Cain, M.D.  DATE OF BIRTH:  Oct 02, 1960  DATE OF PROCEDURE:  07/25/2016 DATE OF DISCHARGE:                              OPERATIVE REPORT   PREOPERATIVE DIAGNOSES: 1. Chronic right shoulder pain, impingement syndrome. 2. Right shoulder symptomatic AC joint arthropathy. 3. Right shoulder full-thickness rotator cuff tear.  POSTOPERATIVE DIAGNOSES: 1. Chronic right shoulder pain, impingement syndrome. 2. Right shoulder symptomatic AC joint arthropathy. 3. Right shoulder full-thickness rotator cuff tear. 4. Degenerative labral tear.  PROCEDURE: 1. Right shoulder examination under anesthesia. 2. Right shoulder glenohumeral joint diagnostic arthroscopy. 3. Labral debridement. 4. Arthroscopic subacromial decompression, bursectomy. 5. Arthroscopic distal clavicle resection. 6. Arthroscopic rotator cuff repair using double-row suture repair     construct.  SURGEON:  Taylor Cain, M.D.  ASSISTANTOlivia Mackie Cain, P.A.-C.  ANESTHESIA:  General endotracheal as well as an interscalene block.  ESTIMATED BLOOD LOSS:  Minimal.  DRAINS:  None.  HISTORY:  Taylor Cain is a 55 year old female, who has had chronic and progressive increasing right shoulder pain related to chronic impingement syndrome and rotator cuff tear based on MRI evidence.  Due to ongoing pain and functional limitation, she is brought to the operating room at this time for planned right shoulder arthroscopy as described below.  We have counseled Taylor Cain regarding her treatment options, potential risks versus benefits thereof.  Possible surgical complications were all reviewed including bleeding, infection, vascular injury, persistent pain, loss of motion, anesthetic complication, recurrence of rotator cuff tear, and possible need for  additional surgery.  She understands and accepts and agrees to the planned procedure.  PROCEDURE IN DETAIL:  After undergoing routine preop evaluation, the patient received prophylactic antibiotics.  Interscalene block was established in the holding area with the Anesthesia Department.  Placed supine on the operating table.  Underwent smooth induction of general endotracheal anesthesia.  Turned to the left lateral decubitus position on a beanbag and appropriately padded and protected.  Right shoulder examination under anesthesia revealed full motion and no instability patterns were noted.  The right arm was suspended at 70 degrees of abduction with 10 pounds of traction.  The right shoulder region was sterilely prepped and draped in standard fashion.  A time-out was called.  Posterior portal was established in the glenohumeral joint. The anterior portal were established under direct visualization. Articular surfaces were all found to be in excellent condition. Capsular volume within normal limits.  No instability patterns noted. There was extensive degenerative tearing of the anterior, superior, and posterior superior labrum.  These areas were all debrided with shaver back to stable margin.  The biceps tendon, however, showed normal caliber with no evidence for proximal or distal instability.  There was obvious defect in the rotator cuff involving the distal supraspinatus, which we did perform a debridement from the articular side.  No additional pathologies were identified in the glenohumeral joint.  Fluid was removed.  Arm was then dropped down to 30 degrees of abduction. Arthroscope was introduced in the subacromial space at the  posterior portal and a direct lateral portal was established in the subacromial space.  Abundant dense bursal tissue and multiple adhesions were encountered, and these all divided and excised from the shaver and Stryker wand.  The wand was then used to remove  the periosteum from the undersurface of the anterior half of the acromion.  The subacromial decompression was performed with a burr creating a type 1 morphology. The portal was then established directly anterior to the distal clavicle and a distal clavicle resection was performed with a burr.  Care was taken to confirm visualization of the entire circumference of the distal clavicle to ensure adequate removal of the bone.  We then completed the subacromial/subdeltoid bursectomy.  We carefully identified the rotator cuff tear and debrided the torn margin of the tendon back to healthy tissue with the footprint approximately 2.5 cm in width.  The greater tuberosity was then prepared removing the soft tissue and abrading the bone to bleeding bed.  Through a stab wound of lateral margin acromion, placed an Arthrex PEEK SwiveLock suture anchor loaded with FiberTape and all 4 suture limbs were then settled equidistant across with the rotator cuff tear using the Scorpion suture passer.  We placed 2 lateral row anchors creating a double-row repair, which nicely compressed the margin of the rotator cuff tendon against the greater tuberosity.  Overall construct was much to our satisfaction.  At this point, suture limbs were all then clipped.  Bursectomy was completed.  Hemostasis was obtained.  Fluid was removed.  The portals were closed with Monocryl and Steri-Strips.  A dry dressing taped at the right shoulder and was placed in a sling.  The patient was awakened, extubated, and taken to the recovery room in stable condition.  Taylor Cain, P.A.-C. was used as an Environmental consultant throughout this case, was essential for help with positioning the patient, positioning extremity, management of the arthroscopic equipment, tissue manipulation, suture management, wound closure, and intraoperative decision making.     Taylor Clines. Taylor Cain, M.D.     KMS/MEDQ  D:  07/25/2016  T:  07/26/2016  Job:  [id number]

## 2016-08-06 ENCOUNTER — Ambulatory Visit: Payer: 59 | Attending: Orthopedic Surgery

## 2016-08-06 DIAGNOSIS — Z9889 Other specified postprocedural states: Secondary | ICD-10-CM | POA: Insufficient documentation

## 2016-08-06 DIAGNOSIS — M6281 Muscle weakness (generalized): Secondary | ICD-10-CM | POA: Diagnosis not present

## 2016-08-06 DIAGNOSIS — M25512 Pain in left shoulder: Secondary | ICD-10-CM | POA: Diagnosis not present

## 2016-08-06 DIAGNOSIS — M25612 Stiffness of left shoulder, not elsewhere classified: Secondary | ICD-10-CM | POA: Insufficient documentation

## 2016-08-06 NOTE — Patient Instructions (Signed)
Issued from protocol in book PROM flexion ER and IR  All to tolerance and she was  advised no pain at this point if able 5 reps 4x/day and increase to 10 reps 4-6x/day as tolerated

## 2016-08-06 NOTE — Therapy (Signed)
[place], [place], [address] Phone: [phone]   Fax:  [phone]  Physical Therapy Evaluation  Patient Details  Name: Taylor Cain MRN: [id number] Date of Birth: 08-29-1961 Referring Provider: Ander Slade, MD  Encounter Date: 08/06/2016      PT End of Session - 08/06/16 1320    Visit Number 1   Number of Visits 25   Date for PT Re-Evaluation 11/22/16   Authorization Type UMR   PT Start Time 1230   PT Stop Time 1320   PT Time Calculation (min) 50 min   Activity Tolerance Patient tolerated treatment well;No increased pain   Behavior During Therapy WFL for tasks assessed/performed      Past Medical History:  Diagnosis Date  . Anemia   . Arthritis    right shoulder  . GERD (gastroesophageal reflux disease)   . Heart palpitations    made aware when patient was in 20's-30's; states they are very infrequent and pt. remains asymptomatic  . History of pneumonia   . Seasonal allergies     Past Surgical History:  Procedure Laterality Date  . COLONOSCOPY    . KNEE ARTHROSCOPY Right 1990, 1992   torn acl  . SHOULDER ARTHROSCOPY WITH ROTATOR CUFF REPAIR AND SUBACROMIAL DECOMPRESSION Right 07/25/2016   Procedure: RIGHT SHOULDER ARTHROSCOPY WITH ROTATOR CUFF REPAIR AND SUBACROMIAL DECOMPRESSION, DISTAL CLAVICLE RESECTION;  Surgeon: Justice Britain, MD;  Location: [facility];  Service: Orthopedics;  Laterality: Right;    There were no vitals filed for this visit.       Subjective Assessment - 08/06/16 1238    Subjective PROM only until 6 weeks post op then return to PT to begin rehab She is doong pendulums  small range to tolerance   Pertinent History PT in past prior to surgery.    Limitations --  no activity with RT arm until 6 weeks post surgery   Patient Stated Goals She wants to return to working out Return to activity as prior to onset of pain   Currently in Pain? Yes   Pain Score 3    Pain  Location Shoulder   Pain Orientation Right;Anterior   Pain Descriptors / Indicators Aching   Pain Type Surgical pain   Pain Onset More than a month ago   Pain Frequency Intermittent   Aggravating Factors  exercises, holding items   Pain Relieving Factors rest, tylenol   Multiple Pain Sites No            [facility] PT Assessment - 08/06/16 1233      Assessment   Medical Diagnosis RT shoulde decompression with RTC repair   Referring Provider Ander Slade, MD   Onset Date/Surgical Date 07/25/16   Hand Dominance Right   Next MD Visit 09/06/16   Prior Therapy yes prior to surgery     Precautions   Precautions Shoulder   Type of Shoulder Precautions RTC  PROM only to tolerance   Shoulder Interventions Shoulder sling/immobilizer     Restrictions   Weight Bearing Restrictions Yes   RUE Weight Bearing Non weight bearing     Balance Screen   Has the patient fallen in the past 6 months No   Has the patient had a decrease in activity level because of a fear of falling?  No   Is the patient reluctant to leave their home because of a fear of falling?  No     Prior Function   Level of Independence --  no asssitance but limiting activity due to RT arm   Vocation Full time employment   Vocation Requirements administration   Leisure No work at gym     Cognition   Overall Cognitive Status Within Functional Limits for tasks assessed     Observation/Other Assessments   Focus on Therapeutic Outcomes (FOTO)  66% limited     ROM / Strength   AROM / PROM / Strength PROM     PROM   PROM Assessment Site Shoulder   Right/Left Shoulder Right   Right Shoulder Extension 15 Degrees   Right Shoulder Flexion 95 Degrees   Right Shoulder ABduction 70 Degrees   Right Shoulder Internal Rotation 65 Degrees  no abduction   Right Shoulder External Rotation 15 Degrees  no abduction   Right Shoulder Horizontal ABduction 5 Degrees   Right Shoulder Horizontal  ADduction 95 Degrees                            PT Education - 08/06/16 1242    Education provided Yes   Education Details POC. PROM exercises supine   Person(s) Educated Patient   Methods Explanation;Demonstration;Tactile cues;Verbal cues;Handout   Comprehension Verbalized understanding;Returned demonstration          PT Short Term Goals - 08/06/16 1325      PT SHORT TERM GOAL #1   Title She will be independent with inital HEP   Time 4   Period Weeks   Status New     PT SHORT TERM GOAL #2   Title She will report able to do progression of HEP without pain   Time 4   Period Weeks   Status New     PT SHORT TERM GOAL #3   Title She will be able to progess within protocol when Ok'd by MD   Time 4   Period Weeks   Status New           PT Long Term Goals - 08/06/16 1326      PT LONG TERM GOAL #1   Title She will be independent with all HEP issued   Time 16   Period Weeks   Status New     PT LONG TERM GOAL #2   Title She will progress to independent self cre with RT arm withou 1/10 max pain   Time 16   Period Weeks   Status New     PT LONG TERM GOAL #3   Title She will be able to retunr to gym with basic lifting program with 1/10 max pain.   Time 16   Period Weeks   Status New     PT LONG TERM GOAL #4   Title She will have full activ e ROM RT shoulder equal LT to return to normal activity   Time 16   Period Weeks   Status New               Plan - 08/06/16 1321    Clinical Impression Statement Ms Malueg presents for moderate complexity eval post RTC repair with SAD, and labral trimming  and bursa removal. She is limited to PROM for 6 weeks and is here today for inital assessment and PROM HEP She is already doing pendulum exercises so issued self PROM for home    Rehab Potential Good   PT Frequency 2x / week   PT Duration 12 weeks  This will start when Dr Onnie Graham orders.  after 6 weeks post op   PT Treatment/Interventions Cryotherapy;Therapeutic  exercise;Patient/family education;Manual techniques;Taping;Passive range of motion;Electrical Stimulation  Protocol or per Dr Susie Cassette orders   PT Next Visit Plan PROM at home until Dr Onnie Graham orders PT so will be on hold unless she needs to come in with questions about progressing PROm or other issues.    PT Home Exercise Plan PROm for rotation with arm by side and flexion   Consulted and Agree with Plan of Care Patient      Patient will benefit from skilled therapeutic intervention in order to improve the following deficits and impairments:  Impaired UE functional use, Pain, Decreased range of motion, Decreased strength, Decreased activity tolerance  Visit Diagnosis: S/P right rotator cuff repair  Status post decompression of subacromial space  Left shoulder pain, unspecified chronicity  Stiffness of left shoulder, not elsewhere classified  Muscle weakness (generalized)     Problem List Patient Active Problem List   Diagnosis Date Noted  . Elevated LFTs 04/21/2015  . Acne vulgaris 01/29/2013  . Anemia 01/29/2013  . MIGRAINE, COMMON 10/15/2007  . PREMATURE VENTRICULAR CONTRACTIONS 10/15/2007  . GERD 10/15/2007    [name]  PT 08/06/2016, 1:31 PM  [facility] [address] [place], [place], [address] Phone: [phone]   Fax:  [phone]  Name: Taylor Cain MRN: [id number] Date of Birth: 23-May-1961

## 2016-08-26 MED FILL — NAPROXEN 500 MG TABLET: 500 | 30 days supply | Qty: 60 | Fill #1

## 2016-08-26 MED FILL — AMOXICILLIN 500 MG CAPSULE: 500 | 30 days supply | Qty: 60 | Fill #1

## 2016-08-26 MED FILL — diazePAM 5 MG TABS: 5 | 10 days supply | Qty: 40 | Fill #1

## 2016-09-09 MED FILL — traMADol HCL 50 MG TABS: 50 | 15 days supply | Qty: 30 | Fill #0

## 2016-09-19 ENCOUNTER — Ambulatory Visit: Payer: 59 | Attending: Orthopedic Surgery

## 2016-09-19 DIAGNOSIS — M6281 Muscle weakness (generalized): Secondary | ICD-10-CM | POA: Insufficient documentation

## 2016-09-19 DIAGNOSIS — Z9889 Other specified postprocedural states: Secondary | ICD-10-CM | POA: Insufficient documentation

## 2016-09-19 DIAGNOSIS — M25511 Pain in right shoulder: Secondary | ICD-10-CM | POA: Diagnosis not present

## 2016-09-19 NOTE — Patient Instructions (Signed)
Strengthening: Resisted Internal Rotation   Hold tubing in left hand, elbow at side and forearm out. Rotate forearm in across body. Repeat ____ times per set. Do ____ sets per session. Do ____ sessions per day.  http://orth.exer.us/830   Copyright  VHI. All rights reserved.  Strengthening: Resisted External Rotation   Hold tubing in right hand, elbow at side and forearm across body. Rotate forearm out. Repeat ____ times per set. Do ____ sets per session. Do ____ sessions per day.  http://orth.exer.us/828   Copyright  VHI. All rights reserved.  Strengthening: Resisted Flexion   Hold tubing with left arm at side. Pull forward and up. Move shoulder through pain-free range of motion. Repeat ____ times per set. Do ____ sets per session. Do ____ sessions per day.  http://orth.exer.us/824   Copyright  VHI. All rights reserved.  Strengthening: Resisted Extension   Hold tubing in right hand, arm forward. Pull arm back, elbow straight. Repeat ____ times per set. Do ____ sets per session. Do ____ sessions per day.  http://orth.exer.us/832   Copyright  VHI. All rights reserved.  All exercise 5 reps 5 sec for next 6-8 days then increase reps / pressure or time as tolerated without pain.    Also stretch behind back and over head 2-3x/day   3-5 sec 1-3 reps

## 2016-09-19 NOTE — Therapy (Signed)
Taylor Cain, [place], [address] Phone: [phone]   Fax:  [phone]  Physical Therapy Treatment/ Re-Evaluation  Patient Details  Name: Taylor Cain MRN: [id number] Date of Birth: 03-Jun-1961 Referring Provider: Cordelia Pen, MD  Encounter Date: 09/19/2016      PT End of Session - 09/19/16 1633    Visit Number 2   Number of Visits 25   Date for PT Re-Evaluation 11/22/16   Authorization Type UMR   PT Start Time 0345   PT Stop Time 0425   PT Time Calculation (min) 40 min   Activity Tolerance Patient tolerated treatment well;No increased pain   Behavior During Therapy WFL for tasks assessed/performed      Past Medical History:  Diagnosis Date  . Anemia   . Arthritis    right shoulder  . GERD (gastroesophageal reflux disease)   . Heart palpitations    made aware when patient was in 20's-30's; states they are very infrequent and pt. remains asymptomatic  . History of pneumonia   . Seasonal allergies     Past Surgical History:  Procedure Laterality Date  . COLONOSCOPY    . KNEE ARTHROSCOPY Right 1990, 1992   torn acl  . SHOULDER ARTHROSCOPY WITH ROTATOR CUFF REPAIR AND SUBACROMIAL DECOMPRESSION Right 07/25/2016   Procedure: RIGHT SHOULDER ARTHROSCOPY WITH ROTATOR CUFF REPAIR AND SUBACROMIAL DECOMPRESSION, DISTAL CLAVICLE RESECTION;  Surgeon: Justice Britain, MD;  Location: [facility];  Service: Orthopedics;  Laterality: Right;    There were no vitals filed for this visit.      Subjective Assessment - 09/19/16 1552    Subjective End of day discomfort , using steering wheel to LT , abduction over shoulder level , meds at time to sleep. She reports lift limit of 2 pounds and not overhead, continue to use sling in public.   Limitations Lifting;House hold activities;Writing  Any activity requiring RT arm    Currently in Pain? No/denies   Pain Score --  2 highest in past week   Pain Location Shoulder    Pain Orientation Right;Anterior   Pain Type Surgical pain   Pain Onset More than a month ago   Pain Frequency Intermittent   Aggravating Factors  using arm   Pain Relieving Factors rest ,medication   Multiple Pain Sites No            [facility] PT Assessment - 09/19/16 0001      Assessment   Medical Diagnosis RT shoulde decompression with RTC repair   Referring Provider Cordelia Pen, MD   Onset Date/Surgical Date 07/25/16   Hand Dominance Right   Next MD Visit 10/07/16   Prior Therapy yes prior to surgery and after one visit     Precautions   Precaution Comments Sling to be worn out in public , 2 pound lifting weight, no overhead work but can reach      Restrictions   Weight Bearing Restrictions Yes   RUE Weight Bearing Weight bearing as tolerated   Other Position/Activity Restrictions --  2 pound limit     Balance Screen   Has the patient fallen in the past 6 months No   Has the patient had a decrease in activity level because of a fear of falling?  No   Is the patient reluctant to leave their home because of a fear of falling?  No     Observation/Other Assessments   Focus on Therapeutic Outcomes (FOTO)  47% limited  AROM   Right Shoulder Flexion 150 Degrees   Right Shoulder ABduction 152 Degrees   Right Shoulder Internal Rotation 50 Degrees  reaching behid back 5 inches less on Lt.    Right Shoulder External Rotation 85 Degrees   Right Shoulder Horizontal ABduction 15 Degrees   Right Shoulder Horizontal  ADduction 100 Degrees     Strength   Right Shoulder Flexion 4/5   Right Shoulder Extension 4+/5   Right Shoulder ABduction 4/5   Right Shoulder Internal Rotation 5/5   Right Shoulder External Rotation 4/5   Right Shoulder Horizontal ABduction 4/5   Right Shoulder Horizontal ADduction 4/5                     [facility] Adult PT Treatment/Exercise - 09/19/16 0001      Shoulder Exercises: Isometric Strengthening   Flexion 5X5"   Extension 5X5"    External Rotation 5X5"   Internal Rotation 5X5"   ABduction 5X5"   ADduction 5X5"     Shoulder Exercises: Stretch   Other Shoulder Stretches Behind and overhead stretching x 2 3-5 sec each RT shoulder                PT Education - 09/19/16 1646    Education provided Yes   Education Details  isometrics and stretch   Person(s) Educated Patient   Methods Explanation;Demonstration;Tactile cues;Verbal cues;Handout   Comprehension Returned demonstration;Verbalized understanding          PT Short Term Goals - 08/06/16 1325      PT SHORT TERM GOAL #1   Title She will be independent with inital HEP   Time 4   Period Weeks   Status New     PT SHORT TERM GOAL #2   Title She will report able to do progression of HEP without pain   Time 4   Period Weeks   Status New     PT SHORT TERM GOAL #3   Title She will be able to progess within protocol when Ok'd by MD   Time 4   Period Weeks   Status New           PT Long Term Goals - 08/06/16 1326      PT LONG TERM GOAL #1   Title She will be independent with all HEP issued   Time 16   Period Weeks   Status New     PT LONG TERM GOAL #2   Title She will progress to independent self cre with RT arm withou 1/10 max pain   Time 16   Period Weeks   Status New     PT LONG TERM GOAL #3   Title She will be able to retunr to gym with basic lifting program with 1/10 max pain.   Time 16   Period Weeks   Status New     PT LONG TERM GOAL #4   Title She will have full activ e ROM RT shoulder equal LT to return to normal activity   Time 16   Period Weeks   Status New               Plan - 09/19/16 1633    Clinical Impression Statement [name] returns after 6 weeks with excellant ROM and good strength with pain well controlled post RTC repair and decompression. she reports limits of 2 pounds for lifting and not overhead  so we started with isometrics and stretching and will carefully  progress to band exercises  below  shoulder height and mobs as needed for ROM.   PT Frequency 2x / week   PT Duration --  10-12 weeks   PT Treatment/Interventions Cryotherapy;Therapeutic exercise;Patient/family education;Manual techniques;Taping;Passive range of motion;Electrical Stimulation   PT Next Visit Plan Review isometrics and stretching. She will return after 09/30/16 and at that time will start yellow band rockwood with pain as guide. , manual if needed   PT Home Exercise Plan PROm for rotation with arm by side and flexion, isometrics, stretch over head and behind back   Consulted and Agree with Plan of Care Patient      Patient will benefit from skilled therapeutic intervention in order to improve the following deficits and impairments:  Impaired UE functional use, Pain, Decreased range of motion, Decreased strength, Decreased activity tolerance  Visit Diagnosis: S/P right rotator cuff repair - Plan: PT plan of care cert/re-cert  Status post decompression of subacromial space - Plan: PT plan of care cert/re-cert  Muscle weakness (generalized) - Plan: PT plan of care cert/re-cert  Right shoulder pain, unspecified chronicity - Plan: PT plan of care cert/re-cert     Problem List Patient Active Problem List   Diagnosis Date Noted  . Elevated LFTs 04/21/2015  . Acne vulgaris 01/29/2013  . Anemia 01/29/2013  . MIGRAINE, COMMON 10/15/2007  . PREMATURE VENTRICULAR CONTRACTIONS 10/15/2007  . GERD 10/15/2007    [name]  PT 09/19/2016, 4:51 PM  [facility] [address] [place], [place], [address] Phone: [phone]   Fax:  [phone]  Name: Taylor Cain MRN: [id number] Date of Birth: Apr 08, 1961

## 2016-10-02 ENCOUNTER — Ambulatory Visit: Payer: 59

## 2016-10-08 ENCOUNTER — Ambulatory Visit: Payer: 59 | Attending: Orthopedic Surgery | Admitting: Physical Therapy

## 2016-10-08 DIAGNOSIS — M25511 Pain in right shoulder: Secondary | ICD-10-CM | POA: Diagnosis not present

## 2016-10-08 DIAGNOSIS — M25512 Pain in left shoulder: Secondary | ICD-10-CM | POA: Insufficient documentation

## 2016-10-08 DIAGNOSIS — Z9889 Other specified postprocedural states: Secondary | ICD-10-CM | POA: Insufficient documentation

## 2016-10-08 DIAGNOSIS — M6281 Muscle weakness (generalized): Secondary | ICD-10-CM | POA: Diagnosis not present

## 2016-10-08 NOTE — Therapy (Signed)
[place], [place], [address] Phone: [phone]   Fax:  [phone]  Physical Therapy Treatment  Patient Details  Name: Taylor Cain MRN: [id number] Date of Birth: April 01, 1961 Referring Provider: Cordelia Pen, MD  Encounter Date: 10/08/2016      PT End of Session - 10/08/16 1548    Visit Number 3   Number of Visits 25   Date for PT Re-Evaluation 11/22/16   Authorization Type UMR   PT Start Time 0345   PT Stop Time 2563  decreased treatment time due to pt symptoms   PT Time Calculation (min) 35 min   Activity Tolerance Patient tolerated treatment well;No increased pain   Behavior During Therapy WFL for tasks assessed/performed      Past Medical History:  Diagnosis Date  . Anemia   . Arthritis    right shoulder  . GERD (gastroesophageal reflux disease)   . Heart palpitations    made aware when patient was in 20's-30's; states they are very infrequent and pt. remains asymptomatic  . History of pneumonia   . Seasonal allergies     Past Surgical History:  Procedure Laterality Date  . COLONOSCOPY    . KNEE ARTHROSCOPY Right 1990, 1992   torn acl  . SHOULDER ARTHROSCOPY WITH ROTATOR CUFF REPAIR AND SUBACROMIAL DECOMPRESSION Right 07/25/2016   Procedure: RIGHT SHOULDER ARTHROSCOPY WITH ROTATOR CUFF REPAIR AND SUBACROMIAL DECOMPRESSION, DISTAL CLAVICLE RESECTION;  Surgeon: Justice Britain, MD;  Location: [place];  Service: Orthopedics;  Laterality: Right;    There were no vitals filed for this visit.      Subjective Assessment - 10/08/16 1547    Subjective No more weight restrictions. I reached up to hug someone goodbye and I had a loud pop in my shoulder 2 weeks ago. MD thinks its okay.    Currently in Pain? No/denies   Aggravating Factors  overhead reaching, pulling up the covers    Pain Relieving Factors rest            [facility] PT Assessment - 10/08/16 0001      AROM   Right Shoulder  Flexion 160 Degrees  supine   Right Shoulder ABduction 160 Degrees  supine     PROM   Right Shoulder Flexion 165 Degrees   Right Shoulder ABduction 160 Degrees   Right Shoulder Internal Rotation 65 Degrees   Right Shoulder External Rotation 45 Degrees                     [facility] Adult PT Treatment/Exercise - 10/08/16 0001      Shoulder Exercises: Supine   Other Supine Exercises supine cane exercises chest press, pullovers, horizontal abduction, ER//IR x 12 each     Shoulder Exercises: Isometric Strengthening   Flexion 5X5"   Extension 5X5"   External Rotation 5X5"   Internal Rotation 5X5"   ABduction 5X5"     Shoulder Exercises: Stretch   Other Shoulder Stretches --     Manual Therapy   Manual Therapy Passive ROM   Passive ROM shoulder flexion, abduction, ER, IR to tolerance                 PT Education - 10/08/16 1612    Education provided Yes   Education Details HEP, supine aarom   Person(s) Educated Patient   Methods Explanation;Handout   Comprehension Verbalized understanding          PT Short Term Goals - 08/06/16 1325  PT SHORT TERM GOAL #1   Title She will be independent with inital HEP   Time 4   Period Weeks   Status New     PT SHORT TERM GOAL #2   Title She will report able to do progression of HEP without pain   Time 4   Period Weeks   Status New     PT SHORT TERM GOAL #3   Title She will be able to progess within protocol when Ok'd by MD   Time 4   Period Weeks   Status New           PT Long Term Goals - 08/06/16 1326      PT LONG TERM GOAL #1   Title She will be independent with all HEP issued   Time 16   Period Weeks   Status New     PT LONG TERM GOAL #2   Title She will progress to independent self cre with RT arm withou 1/10 max pain   Time 16   Period Weeks   Status New     PT LONG TERM GOAL #3   Title She will be able to retunr to gym with basic lifting program with 1/10 max pain.   Time 16    Period Weeks   Status New     PT LONG TERM GOAL #4   Title She will have full activ e ROM RT shoulder equal LT to return to normal activity   Time 16   Period Weeks   Status New               Plan - 10/08/16 1612    Clinical Impression Statement Pt reports loud pop at right shoulder while reaching up to hug someone 2 weeks ago. She saw MD last week who cleared her to resume PT. She has not performed any HEP since the pop. PROM/AROM good with pain at end range ER around 40 degrees. Began supine AROM exercises with mild pain with ER, cues to keep comfortable. Reviewed isometrics with no increased pain. No goals met due to recent exacerbation.    PT Next Visit Plan assess response to treatment, she is 10 weeks post op, painfree progression within RTC protocol. ( loud pop at right shoulder on 12/24 per patient and she has felt more discomfort since then)   PT Whitney for rotation with arm by side and flexion, isometrics, stretch over head and behind back   Consulted and Agree with Plan of Care Patient      Patient will benefit from skilled therapeutic intervention in order to improve the following deficits and impairments:  Impaired UE functional use, Pain, Decreased range of motion, Decreased strength, Decreased activity tolerance  Visit Diagnosis: S/P right rotator cuff repair  Status post decompression of subacromial space  Muscle weakness (generalized)  Right shoulder pain, unspecified chronicity     Problem List Patient Active Problem List   Diagnosis Date Noted  . Elevated LFTs 04/21/2015  . Acne vulgaris 01/29/2013  . Anemia 01/29/2013  . MIGRAINE, COMMON 10/15/2007  . PREMATURE VENTRICULAR CONTRACTIONS 10/15/2007  . GERD 10/15/2007    Dorene Ar , PTA 10/08/2016, 4:29 PM  [facility] [address] [place], [place], [address] Phone: [phone]   Fax:  [phone]  Name: BIJOU EASLER MRN: [id number] Date of Birth: 06-14-1961

## 2016-10-08 NOTE — Patient Instructions (Signed)
SHOULDER: External Rotation - Supine (Cane)   Hold cane with both hands. Rotate arm away from body. Keep elbow on floor and next to body. __10_ reps per set, __2_ sets per day, __7_ days per week Add towel to keep elbow at side.  Copyright  VHI. All rights reserved.  Cane Horizontal - Supine   With straight arms holding cane above shoulders, bring cane out to right, center, out to left, and back to above head. Repeat _10-20__ times. Do _2__ times per day.  Copyright  VHI. All rights reserved.  Cane Exercise: Flexion  Press both hands up to ceiling x 10 then Keeping arms as straight as possible, lower cane toward floor beyond head. Hold _5___ seconds. Repeat __10-20__ times. Do ___2_ sessions per day.

## 2016-10-11 ENCOUNTER — Ambulatory Visit: Payer: 59

## 2016-10-11 DIAGNOSIS — M6281 Muscle weakness (generalized): Secondary | ICD-10-CM | POA: Diagnosis not present

## 2016-10-11 DIAGNOSIS — Z9889 Other specified postprocedural states: Secondary | ICD-10-CM | POA: Diagnosis not present

## 2016-10-11 DIAGNOSIS — M25511 Pain in right shoulder: Secondary | ICD-10-CM

## 2016-10-11 DIAGNOSIS — M25512 Pain in left shoulder: Secondary | ICD-10-CM | POA: Diagnosis not present

## 2016-10-11 NOTE — Therapy (Signed)
[place], [place], [address] Phone: [phone]   Fax:  [phone]  Physical Therapy Treatment  Patient Details  Name: Taylor Cain MRN: [id number] Date of Birth: 1961/05/22 Referring Provider: Justice Britain  MD  Encounter Date: 10/11/2016      PT End of Session - 10/11/16 0706    Visit Number 4   Number of Visits 25   Date for PT Re-Evaluation 11/22/16   Authorization Type UMR   PT Start Time 0659   PT Stop Time 0751   PT Time Calculation (min) 52 min   Activity Tolerance Patient tolerated treatment well;No increased pain   Behavior During Therapy WFL for tasks assessed/performed      Past Medical History:  Diagnosis Date  . Anemia   . Arthritis    right shoulder  . GERD (gastroesophageal reflux disease)   . Heart palpitations    made aware when patient was in 20's-30's; states they are very infrequent and pt. remains asymptomatic  . History of pneumonia   . Seasonal allergies     Past Surgical History:  Procedure Laterality Date  . COLONOSCOPY    . KNEE ARTHROSCOPY Right 1990, 1992   torn acl  . SHOULDER ARTHROSCOPY WITH ROTATOR CUFF REPAIR AND SUBACROMIAL DECOMPRESSION Right 07/25/2016   Procedure: RIGHT SHOULDER ARTHROSCOPY WITH ROTATOR CUFF REPAIR AND SUBACROMIAL DECOMPRESSION, DISTAL CLAVICLE RESECTION;  Surgeon: Justice Britain, MD;  Location: [place];  Service: Orthopedics;  Laterality: Right;    There were no vitals filed for this visit.      Subjective Assessment - 10/11/16 0704    Subjective Pain unchanged . Hard to sleep at times.    Currently in Pain? No/denies            [facility] PT Assessment - 10/11/16 0001      Assessment   Referring Provider Justice Britain  MD     AROM   Right Shoulder Flexion 165 Degrees   Right Shoulder ABduction 172 Degrees   Right Shoulder Internal Rotation 62 Degrees   Right Shoulder External Rotation 103 Degrees                      [facility] Adult PT Treatment/Exercise - 10/11/16 0707      Shoulder Exercises: Supine   Protraction Right;15 reps   Protraction Weight (lbs) 3 then circles x 15 clock and counter clockwise   Protraction Limitations She reports some pain so I advised for next time to stop with nay pain escalation     Shoulder Exercises: Prone   Extension Both;15 reps  no weight cued for depression and retract scapula   Horizontal ABduction 1 Both;15 reps  no weight , cued for scapula     Shoulder Exercises: Sidelying   External Rotation Right;15 reps   External Rotation Weight (lbs) 2     Shoulder Exercises: ROM/Strengthening   UBE (Upper Arm Bike) ybex 5 min forward 120 RPM cautioned to stop if painful     Modalities   Modalities Cryotherapy     Cryotherapy   Number Minutes Cryotherapy 12 Minutes   Cryotherapy Location Shoulder   Type of Cryotherapy Ice pack                  PT Short Term Goals - 08/06/16 1325      PT SHORT TERM GOAL #1   Title She will be independent with inital HEP   Time 4  Period Weeks   Status New     PT SHORT TERM GOAL #2   Title She will report able to do progression of HEP without pain   Time 4   Period Weeks   Status New     PT SHORT TERM GOAL #3   Title She will be able to progess within protocol when Ok'd by MD   Time 4   Period Weeks   Status New           PT Long Term Goals - 08/06/16 1326      PT LONG TERM GOAL #1   Title She will be independent with all HEP issued   Time 16   Period Weeks   Status New     PT LONG TERM GOAL #2   Title She will progress to independent self cre with RT arm withou 1/10 max pain   Time 16   Period Weeks   Status New     PT LONG TERM GOAL #3   Title She will be able to retunr to gym with basic lifting program with 1/10 max pain.   Time 16   Period Weeks   Status New     PT LONG TERM GOAL #4   Title She will have full activ e ROM RT shoulder equal LT to return  to normal activity   Time 16   Period Weeks   Status New               Plan - 10/11/16 [id number]    Clinical Impression Statement Pain unchanged with intermittant pain and some sleep distrubance. She reported some mild soreness helped by ice and I suggested she ice during day and take medication allowed by MD . If soreness  problematic will back up with isometrics again, STW/modalities.    PT Treatment/Interventions Cryotherapy;Therapeutic exercise;Patient/family education;Manual techniques;Taping;Passive range of motion;Electrical Stimulation   PT Next Visit Plan If not overly sore from last visit continue light resistance exercise if not isometrics and manual/ modalities   PT Home Exercise Plan PROm for rotation with arm by side and flexion, isometrics, stretch over head and behind back   Consulted and Agree with Plan of Care Patient      Patient will benefit from skilled therapeutic intervention in order to improve the following deficits and impairments:  Impaired UE functional use, Pain, Decreased range of motion, Decreased strength, Decreased activity tolerance  Visit Diagnosis: S/P right rotator cuff repair  Status post decompression of subacromial space  Muscle weakness (generalized)  Right shoulder pain, unspecified chronicity     Problem List Patient Active Problem List   Diagnosis Date Noted  . Elevated LFTs 04/21/2015  . Acne vulgaris 01/29/2013  . Anemia 01/29/2013  . MIGRAINE, COMMON 10/15/2007  . PREMATURE VENTRICULAR CONTRACTIONS 10/15/2007  . GERD 10/15/2007    [name]  PT 10/11/2016, 7:54 AM  [facility] [address] [place], [place], [address] Phone: [phone]   Fax:  [phone]  Name: Taylor Cain MRN: [id number] Date of Birth: [birth year]

## 2016-10-15 ENCOUNTER — Ambulatory Visit: Payer: 59

## 2016-10-15 DIAGNOSIS — M25511 Pain in right shoulder: Secondary | ICD-10-CM | POA: Diagnosis not present

## 2016-10-15 DIAGNOSIS — Z9889 Other specified postprocedural states: Secondary | ICD-10-CM | POA: Diagnosis not present

## 2016-10-15 DIAGNOSIS — M25512 Pain in left shoulder: Secondary | ICD-10-CM

## 2016-10-15 DIAGNOSIS — M6281 Muscle weakness (generalized): Secondary | ICD-10-CM

## 2016-10-15 NOTE — Therapy (Signed)
[place], [place], [address] Phone: [phone]   Fax:  [phone]  Physical Therapy Treatment  Patient Details  Name: Taylor Cain MRN: [id number] Date of Birth: 10/29/1960 Referring Provider: Justice Britain  MD  Encounter Date: 10/15/2016      PT End of Session - 10/15/16 1508    Visit Number 5   Number of Visits 25   Date for PT Re-Evaluation 11/22/16   Authorization Type UMR   PT Start Time 0300   PT Stop Time 0350   PT Time Calculation (min) 50 min   Activity Tolerance Patient tolerated treatment well   Behavior During Therapy [facility] for tasks assessed/performed      Past Medical History:  Diagnosis Date  . Anemia   . Arthritis    right shoulder  . GERD (gastroesophageal reflux disease)   . Heart palpitations    made aware when patient was in 20's-30's; states they are very infrequent and pt. remains asymptomatic  . History of pneumonia   . Seasonal allergies     Past Surgical History:  Procedure Laterality Date  . COLONOSCOPY    . KNEE ARTHROSCOPY Right 1990, 1992   torn acl  . SHOULDER ARTHROSCOPY WITH ROTATOR CUFF REPAIR AND SUBACROMIAL DECOMPRESSION Right 07/25/2016   Procedure: RIGHT SHOULDER ARTHROSCOPY WITH ROTATOR CUFF REPAIR AND SUBACROMIAL DECOMPRESSION, DISTAL CLAVICLE RESECTION;  Surgeon: Justice Britain, MD;  Location: [place];  Service: Orthopedics;  Laterality: Right;    There were no vitals filed for this visit.      Subjective Assessment - 10/15/16 1506    Subjective doing well. AM discomfort this AM with computer. no pain now   Currently in Pain? No/denies                         [facility] Adult PT Treatment/Exercise - 10/15/16 0001      Therapeutic Activites    Therapeutic Activities Lifting   Lifting reaching in to cabinets at head heiht     Shoulder Exercises: Prone   Extension Right;10 reps  then 1 pound then 2 pounds , then 3 pounds10 reps   Horizontal ABduction 1 Right;10 reps  1-2-3 pounds x 10     Shoulder Exercises: Sidelying   External Rotation Right;15 reps   External Rotation Weight (lbs) 2   ABduction Right;15 reps   ABduction Weight (lbs) 2   Other Sidelying Exercises horizontal abdcution x 15 2 pounds     Shoulder Exercises: Standing   Other Standing Exercises [place]  red x 15 , then ball on wall  x 30 clock and counter clockwise     Shoulder Exercises: ROM/Strengthening   UBE (Upper Arm Bike) Cybex 100 RPM 5 min no pain                PT Education - 10/15/16 1542    Education provided Yes   Education Details cautioned to not work as hard at home and to keep soreness asay as best she can when exerciseing   Person(s) Educated Patient   Methods Explanation   Comprehension Verbalized understanding          PT Short Term Goals - 10/15/16 1545      PT SHORT TERM GOAL #1   Title She will be independent with inital HEP   Status Achieved     PT SHORT TERM GOAL #2   Title She will report able to do progression  of HEP without pain   Status On-going     PT SHORT TERM GOAL #3   Title She will be able to progess within protocol when Ok'd by MD   Status Achieved           PT Long Term Goals - 10/15/16 1545      PT LONG TERM GOAL #1   Title She will be independent with all HEP issued   Status On-going     PT LONG TERM GOAL #2   Title She will progress to independent self care with RT arm with 1/10 max pain   Status On-going     PT LONG TERM GOAL #3   Title She will be able to retun to gym with basic lifting program with 1/10 max pain.   Status On-going     PT LONG TERM GOAL #4   Title She will have full active ROM RT shoulder equal LT to return to normal activity   Status Partially Met               Plan - 10/15/16 1507    Clinical Impression Statement Tolerated exercise with some soreness but no real pain . Mild soreness post exercise addressed with ice pack and she  reported soreness eased    PT Treatment/Interventions Cryotherapy;Therapeutic exercise;Patient/family education;Manual techniques;Taping;Passive range of motion;Electrical Stimulation   PT Next Visit Plan Progress strength as soreness /pain allows with caution.  Cont ice.   PT Home Exercise Plan PROm for rotation with arm by side and flexion, isometrics, stretch over head and behind back   Consulted and Agree with Plan of Care Patient      Patient will benefit from skilled therapeutic intervention in order to improve the following deficits and impairments:  Impaired UE functional use, Pain, Decreased range of motion, Decreased strength, Decreased activity tolerance  Visit Diagnosis: S/P right rotator cuff repair  Status post decompression of subacromial space  Muscle weakness (generalized)  Right shoulder pain, unspecified chronicity  Left shoulder pain, unspecified chronicity     Problem List Patient Active Problem List   Diagnosis Date Noted  . Elevated LFTs 04/21/2015  . Acne vulgaris 01/29/2013  . Anemia 01/29/2013  . MIGRAINE, COMMON 10/15/2007  . PREMATURE VENTRICULAR CONTRACTIONS 10/15/2007  . GERD 10/15/2007    [name]  PT 10/15/2016, 3:49 PM  [facility] [address] [place], [place], [address] Phone: [phone]   Fax:  [phone]  Name: Taylor Cain MRN: [id number] Date of Birth: 10/03/1960

## 2016-10-18 ENCOUNTER — Ambulatory Visit: Payer: 59

## 2016-10-18 DIAGNOSIS — M25511 Pain in right shoulder: Secondary | ICD-10-CM | POA: Diagnosis not present

## 2016-10-18 DIAGNOSIS — M6281 Muscle weakness (generalized): Secondary | ICD-10-CM | POA: Diagnosis not present

## 2016-10-18 DIAGNOSIS — Z9889 Other specified postprocedural states: Secondary | ICD-10-CM

## 2016-10-18 DIAGNOSIS — M25512 Pain in left shoulder: Secondary | ICD-10-CM | POA: Diagnosis not present

## 2016-10-18 NOTE — Therapy (Signed)
[place], [place], [address] Phone: [phone]   Fax:  [phone]  Physical Therapy Treatment  Patient Details  Name: Taylor Cain MRN: [id number] Date of Birth: 08-15-1961 Referring Provider: Justice Britain  MD  Encounter Date: 10/18/2016      PT End of Session - 10/18/16 0934    Visit Number 6   Number of Visits 25   Date for PT Re-Evaluation 11/22/16   Authorization Type UMR   PT Start Time 0926   PT Stop Time 1020   PT Time Calculation (min) 54 min   Activity Tolerance Patient tolerated treatment well   Behavior During Therapy [facility] for tasks assessed/performed      Past Medical History:  Diagnosis Date  . Anemia   . Arthritis    right shoulder  . GERD (gastroesophageal reflux disease)   . Heart palpitations    made aware when patient was in 20's-30's; states they are very infrequent and pt. remains asymptomatic  . History of pneumonia   . Seasonal allergies     Past Surgical History:  Procedure Laterality Date  . COLONOSCOPY    . KNEE ARTHROSCOPY Right 1990, 1992   torn acl  . SHOULDER ARTHROSCOPY WITH ROTATOR CUFF REPAIR AND SUBACROMIAL DECOMPRESSION Right 07/25/2016   Procedure: RIGHT SHOULDER ARTHROSCOPY WITH ROTATOR CUFF REPAIR AND SUBACROMIAL DECOMPRESSION, DISTAL CLAVICLE RESECTION;  Surgeon: Justice Britain, MD;  Location: [place];  Service: Orthopedics;  Laterality: Right;    There were no vitals filed for this visit.      Subjective Assessment - 10/18/16 0933    Subjective Doing great. can tell doing better as she shoveled driveway without incr pain   Currently in Pain? No/denies                         [facility] Adult PT Treatment/Exercise - 10/18/16 0001      Therapeutic Activites    Lifting reaching in to cabinets at head height 3 pounds 3x10 to upper shelf. Report weakness no pain.      Shoulder Exercises: Seated   Other Seated Exercises elbow flexion x  25 5 pounds then supine triceps x 25 5 pounds   Other Seated Exercises seated at reformer with metal frame of off of tower and 2 [place] with scapula retraction and depression  with RT arm pull down short range x 12 . shoulder positioned in 5 degrees of hor adduction to decr tension to shoulder . instructed in use of green band at home  for sae exercise and cued to position body for decrased tension to protect shoulder.      Shoulder Exercises: Prone   Extension Right;10 reps  3 pounds  3 sets   Horizontal ABduction 1 Right;10 reps  3 pounds 1 set   2 pounds 2 sets     Shoulder Exercises: Standing   Other Standing Exercises plank on counter with hand lifts x 12 each   Other Standing Exercises [place]  green x 15 , then ball on wall  x 30 clock and counter clockwise     Shoulder Exercises: ROM/Strengthening   UBE (Upper Arm Bike) Cybex 100 RPM 6 min no pain  3 min forward and 3 min back     Shoulder Exercises: Body Blade   ABduction 30 seconds;3 reps   External Rotation 30 seconds;3 reps     Cryotherapy   Number Minutes Cryotherapy 12 Minutes  Cryotherapy Location Shoulder   Type of Cryotherapy Ice pack                  PT Short Term Goals - 10/15/16 1545      PT SHORT TERM GOAL #1   Title She will be independent with inital HEP   Status Achieved     PT SHORT TERM GOAL #2   Title She will report able to do progression of HEP without pain   Status On-going     PT SHORT TERM GOAL #3   Title She will be able to progess within protocol when Ok'd by MD   Status Achieved           PT Long Term Goals - 10/15/16 1545      PT LONG TERM GOAL #1   Title She will be independent with all HEP issued   Status On-going     PT LONG TERM GOAL #2   Title She will progress to independent self care with RT arm with 1/10 max pain   Status On-going     PT LONG TERM GOAL #3   Title She will be able to retun to gym with basic lifting program with 1/10 max pain.    Status On-going     PT LONG TERM GOAL #4   Title She will have full active ROM RT shoulder equal LT to return to normal activity   Status Partially Met               Plan - 10/18/16 0934    Clinical Impression Statement Contniue to tolerate exercises with resitance with only mild soreness that is managed by cold pack. She is progressing well manageing weight . and not incr irrtiation  with too much activity at home   PT Treatment/Interventions Cryotherapy;Therapeutic exercise;Patient/family education;Manual techniques;Taping;Passive range of motion;Electrical Stimulation   PT Next Visit Plan Progress strength as soreness /pain allows with caution.  Cont ice. Add prone exercises 2 pounds for home and scapiula pull down with band for lower trap and bicep curls and tricep    PT Home Exercise Plan PROM for rotation with arm by side and flexion, isometrics, stretch over head and behind back, gentle rockwood,    Consulted and Agree with Plan of Care Patient      Patient will benefit from skilled therapeutic intervention in order to improve the following deficits and impairments:  Impaired UE functional use, Pain, Decreased range of motion, Decreased strength, Decreased activity tolerance  Visit Diagnosis: Status post decompression of subacromial space  S/P right rotator cuff repair  Muscle weakness (generalized)  Right shoulder pain, unspecified chronicity     Problem List Patient Active Problem List   Diagnosis Date Noted  . Elevated LFTs 04/21/2015  . Acne vulgaris 01/29/2013  . Anemia 01/29/2013  . MIGRAINE, COMMON 10/15/2007  . PREMATURE VENTRICULAR CONTRACTIONS 10/15/2007  . GERD 10/15/2007    [name]  PT 10/18/2016, 10:46 AM  [facility] [address] [place], [place], [address] Phone: [phone]   Fax:  [phone]  Name: Taylor Cain MRN: [id number] Date of Birth: 1960-10-21

## 2016-10-23 ENCOUNTER — Ambulatory Visit: Payer: 59 | Admitting: Physical Therapy

## 2016-10-23 DIAGNOSIS — M25512 Pain in left shoulder: Secondary | ICD-10-CM | POA: Diagnosis not present

## 2016-10-23 DIAGNOSIS — Z9889 Other specified postprocedural states: Secondary | ICD-10-CM | POA: Diagnosis not present

## 2016-10-23 DIAGNOSIS — M25511 Pain in right shoulder: Secondary | ICD-10-CM | POA: Diagnosis not present

## 2016-10-23 DIAGNOSIS — M6281 Muscle weakness (generalized): Secondary | ICD-10-CM | POA: Diagnosis not present

## 2016-10-23 NOTE — Therapy (Signed)
[place], [place], [address] Phone: [phone]   Fax:  [phone]  Physical Therapy Treatment  Patient Details  Name: Taylor Cain MRN: [id number] Date of Birth: Jan 02, 1961 Referring Provider: Justice Britain  MD  Encounter Date: 10/23/2016      PT End of Session - 10/23/16 1733    Visit Number 7   Number of Visits 25   Date for PT Re-Evaluation 11/22/16   PT Start Time 8127   PT Stop Time 1725   PT Time Calculation (min) 46 min   Activity Tolerance Patient tolerated treatment well   Behavior During Therapy [facility] for tasks assessed/performed      Past Medical History:  Diagnosis Date  . Anemia   . Arthritis    right shoulder  . GERD (gastroesophageal reflux disease)   . Heart palpitations    made aware when patient was in 20's-30's; states they are very infrequent and pt. remains asymptomatic  . History of pneumonia   . Seasonal allergies     Past Surgical History:  Procedure Laterality Date  . COLONOSCOPY    . KNEE ARTHROSCOPY Right 1990, 1992   torn acl  . SHOULDER ARTHROSCOPY WITH ROTATOR CUFF REPAIR AND SUBACROMIAL DECOMPRESSION Right 07/25/2016   Procedure: RIGHT SHOULDER ARTHROSCOPY WITH ROTATOR CUFF REPAIR AND SUBACROMIAL DECOMPRESSION, DISTAL CLAVICLE RESECTION;  Surgeon: Justice Britain, MD;  Location: [facility];  Service: Orthopedics;  Laterality: Right;    There were no vitals filed for this visit.      Subjective Assessment - 10/23/16 1644    Subjective last pain Sat.  reached across her body in the shower 3 x and felt a pinch,  Later the mormnig she felt a pop reaching 3 inches above her head and felt a loud pop with pain following,  i noticed some swelling and  pain was 4/10.  She has now nooticed a little les ARom   Currently in Pain? No/denies   Pain Score --  had 4/10 pain Sat see above            [facility] PT Assessment - 10/23/16 0001      AROM   Right Shoulder Flexion  152 Degrees   Right Shoulder ABduction 175 Degrees   Right Shoulder Internal Rotation 80 Degrees                     [facility] Adult PT Treatment/Exercise - 10/23/16 0001      Shoulder Exercises: Supine   Other Supine Exercises triceps 3 X 10, 5 LBS     Shoulder Exercises: Seated   Other Seated Exercises elbow flexion x 30 5 pounds      Shoulder Exercises: Prone   Extension Right;10 reps  3 pounds  3 sets   Horizontal ABduction 1 Right;10 reps  3 LBS, 3 sets     Shoulder Exercises: Standing   Flexion 10 reps  3 sets   Shoulder Flexion Weight (lbs) 3 LBS   Other Standing Exercises plank on counter with hand lifts  at least 15 x each  Lower trap band work 10 x 3,  green band cues initially   Other Standing Exercises rockwood green 3 sets of 10.  Patient declined the need of towel for rotations     Shoulder Exercises: ROM/Strengthening   UBE (Upper Arm Bike) Cybex 100 RPM 6 min no pain  3 min forward and 3 min back     Cryotherapy  Number Minutes Cryotherapy 10 Minutes   Cryotherapy Location Shoulder   Type of Cryotherapy --  cold pack, supine                  PT Short Term Goals - 10/23/16 1737      PT SHORT TERM GOAL #1   Title She will be independent with inital HEP   Time 4   Period Weeks   Status Achieved     PT SHORT TERM GOAL #2   Title She will report able to do progression of HEP without pain   Baseline di not do her home exercises   Time 4   Period Weeks   Status On-going     PT SHORT TERM GOAL #3   Title She will be able to progess within protocol when Ok'd by MD   Time 4   Period Weeks   Status Achieved           PT Long Term Goals - 10/15/16 1545      PT LONG TERM GOAL #1   Title She will be independent with all HEP issued   Status On-going     PT LONG TERM GOAL #2   Title She will progress to independent self care with RT arm with 1/10 max pain   Status On-going     PT LONG TERM GOAL #3   Title She will be able  to retun to gym with basic lifting program with 1/10 max pain.   Status On-going     PT LONG TERM GOAL #4   Title She will have full active ROM RT shoulder equal LT to return to normal activity   Status Partially Met               Plan - 10/23/16 1733    Clinical Impression Statement Session focused on strenghtening.  ROM slightly decreased in flexion, see flow sheet.  No pain with any exercise today.  Ale to progress to 3 pounds with all prone exercises.  No new goals met.     PT Next Visit Plan Progress strength as soreness /pain allows with caution.  Cont ice. Add prone exercises 2 pounds for home and review scapiula pull down with band for lower trap and bicep curls and tricep    PT Home Exercise Plan PROM for rotation with arm by side and flexion, isometrics, stretch over head and behind back, gentle rockwood,    Consulted and Agree with Plan of Care Patient      Patient will benefit from skilled therapeutic intervention in order to improve the following deficits and impairments:  Impaired UE functional use, Pain, Decreased range of motion, Decreased strength, Decreased activity tolerance  Visit Diagnosis: Status post decompression of subacromial space  S/P right rotator cuff repair  Muscle weakness (generalized)  Right shoulder pain, unspecified chronicity     Problem List Patient Active Problem List   Diagnosis Date Noted  . Elevated LFTs 04/21/2015  . Acne vulgaris 01/29/2013  . Anemia 01/29/2013  . MIGRAINE, COMMON 10/15/2007  . PREMATURE VENTRICULAR CONTRACTIONS 10/15/2007  . GERD 10/15/2007    Margret Moat PTA 10/23/2016, 5:39 PM  [facility] [address] [place], [place], [address] Phone: [phone]   Fax:  [phone]  Name: Taylor Cain MRN: [id number] Date of Birth: 09-14-1961

## 2016-10-25 ENCOUNTER — Ambulatory Visit: Payer: 59

## 2016-10-25 DIAGNOSIS — M6281 Muscle weakness (generalized): Secondary | ICD-10-CM

## 2016-10-25 DIAGNOSIS — Z9889 Other specified postprocedural states: Secondary | ICD-10-CM | POA: Diagnosis not present

## 2016-10-25 DIAGNOSIS — M25511 Pain in right shoulder: Secondary | ICD-10-CM

## 2016-10-25 DIAGNOSIS — M25512 Pain in left shoulder: Secondary | ICD-10-CM | POA: Diagnosis not present

## 2016-10-25 NOTE — Therapy (Signed)
[place], [place], [address] Phone: [phone]   Fax:  [phone]  Physical Therapy Treatment  Patient Details  Name: Taylor Cain MRN: [id number] Date of Birth: 1960-10-05 Referring Provider: Justice Britain  MD  Encounter Date: 10/25/2016      PT End of Session - 10/25/16 1034    Visit Number 8   Date for PT Re-Evaluation 11/22/16   Authorization Type UMR   PT Start Time 1017   PT Stop Time 1100   PT Time Calculation (min) 43 min   Activity Tolerance Patient tolerated treatment well   Behavior During Therapy [facility] for tasks assessed/performed      Past Medical History:  Diagnosis Date  . Anemia   . Arthritis    right shoulder  . GERD (gastroesophageal reflux disease)   . Heart palpitations    made aware when patient was in 20's-30's; states they are very infrequent and pt. remains asymptomatic  . History of pneumonia   . Seasonal allergies     Past Surgical History:  Procedure Laterality Date  . COLONOSCOPY    . KNEE ARTHROSCOPY Right 1990, 1992   torn acl  . SHOULDER ARTHROSCOPY WITH ROTATOR CUFF REPAIR AND SUBACROMIAL DECOMPRESSION Right 07/25/2016   Procedure: RIGHT SHOULDER ARTHROSCOPY WITH ROTATOR CUFF REPAIR AND SUBACROMIAL DECOMPRESSION, DISTAL CLAVICLE RESECTION;  Surgeon: Justice Britain, MD;  Location: [place];  Service: Orthopedics;  Laterality: Right;    There were no vitals filed for this visit.      Subjective Assessment - 10/25/16 1027    Subjective Feels like  I get a catch in shoulder at times            [facility] PT Assessment - 10/25/16 0001      AROM   Right Shoulder Flexion 170 Degrees   Right Shoulder ABduction 180 Degrees   Right Shoulder Internal Rotation 80 Degrees   Right Shoulder External Rotation 103 Degrees   Right Shoulder Horizontal ABduction 18 Degrees   Right Shoulder Horizontal  ADduction 100 Degrees     Strength   Right Shoulder Flexion 4/5  may  be slightly better   Right Shoulder Extension 5/5   Right Shoulder ABduction 4+/5   Right Shoulder Internal Rotation 5/5   Right Shoulder External Rotation 4+/5   Right Shoulder Horizontal ABduction 5/5   Right Shoulder Horizontal ADduction 5/5                     [facility] Adult PT Treatment/Exercise - 10/25/16 0001      Shoulder Exercises: Supine   Other Supine Exercises triceps 2 X 15, 7 LBS     Shoulder Exercises: Prone   Extension Right;10 reps   Theraband Level (Shoulder Extension) --  4 pounds   Horizontal ABduction 1 Right;10 reps   Horizontal ABduction 1 Weight (lbs) 4   Other Prone Exercises Row 4 pounds x 20     Shoulder Exercises: Sidelying   External Rotation Right;20 reps   External Rotation Weight (lbs) 3   ABduction Right;20 reps   ABduction Weight (lbs) 3   Other Sidelying Exercises horizontal abdcution x 15 2 pounds     Shoulder Exercises: Standing   Other Standing Exercises plank on counter with hand lifts  at least 15 x each,  blue band abdcution and scapula depression x 20 cue to depress scapula   Other Standing Exercises bicep curls x 30 reps 7 pounds  Shoulder Exercises: ROM/Strengthening   UBE (Upper Arm Bike) Cybex 100 RPM 6 min no pain  3 min forward and 3 min back                  PT Short Term Goals - 10/25/16 1111      PT SHORT TERM GOAL #1   Title She will be independent with inital HEP   Status Revised     PT SHORT TERM GOAL #2   Title She will report able to do progression of HEP without pain   Baseline I am still limiting HEP so she rest RT shoulder   Status On-going     PT SHORT TERM GOAL #3   Title She will be able to progess within protocol when Ok'd by MD   Status Achieved     PT SHORT TERM GOAL #4   Title She will improve strength to 5-/5 min RT shoulder strength.    Time 6   Period Weeks   Status New     PT SHORT TERM GOAL #5   Title She will be able to plank on floor with no pain full weight on  RT arm.    Time 6   Period Weeks   Status New           PT Long Term Goals - 10/15/16 1545      PT LONG TERM GOAL #1   Title She will be independent with all HEP issued   Status On-going     PT LONG TERM GOAL #2   Title She will progress to independent self care with RT arm with 1/10 max pain   Status On-going     PT LONG TERM GOAL #3   Title She will be able to retun to gym with basic lifting program with 1/10 max pain.   Status On-going     PT LONG TERM GOAL #4   Title She will have full active ROM RT shoulder equal LT to return to normal activity   Status Partially Met               Plan - 10/25/16 1035    Clinical Impression Statement Tolerated incr weight today without any  pain  today.   We will continue to progress with strenthening modifying to avoid pain. She reports catching occasionally limiting active motion in RT shoulder  that eventually resolves.    PT Treatment/Interventions Cryotherapy;Therapeutic exercise;Patient/family education;Manual techniques;Taping;Passive range of motion;Electrical Stimulation   PT Next Visit Plan Progress strength as soreness /pain allows with caution.  Cont ice. If she begins 1x/week starting next week  will begin more emphasis on HEP.   PT Home Exercise Plan PROM for rotation with arm by side and flexion, isometrics, stretch over head and behind back, gentle rockwood,    Consulted and Agree with Plan of Care Patient      Patient will benefit from skilled therapeutic intervention in order to improve the following deficits and impairments:  Impaired UE functional use, Pain, Decreased range of motion, Decreased strength, Decreased activity tolerance  Visit Diagnosis: Status post decompression of subacromial space  S/P right rotator cuff repair  Muscle weakness (generalized)  Right shoulder pain, unspecified chronicity     Problem List Patient Active Problem List   Diagnosis Date Noted  . Elevated LFTs 04/21/2015   . Acne vulgaris 01/29/2013  . Anemia 01/29/2013  . MIGRAINE, COMMON 10/15/2007  . PREMATURE VENTRICULAR CONTRACTIONS 10/15/2007  . GERD 10/15/2007  Taylor Cain  PT 10/25/2016, 11:19 AM  [facility] [address] [place], [place], [address] Phone: [phone]   Fax:  [phone]  Name: Taylor Cain MRN: [id number] Date of Birth: 07-23-1961

## 2016-10-29 MED FILL — AMOXICILLIN 500 MG CAPSULE: 500 | 30 days supply | Qty: 60 | Fill #2

## 2016-10-29 MED FILL — CLINDAMYCIN PH 1% GEL: 1 | 30 days supply | Qty: 60 | Fill #1

## 2016-10-29 MED FILL — NAPROXEN 500 MG TABLET: 500 | 30 days supply | Qty: 60 | Fill #0

## 2016-10-29 MED FILL — diazePAM 5 MG TABS: 5 | 30 days supply | Qty: 30 | Fill #0

## 2016-10-29 MED FILL — traMADol HCL 50 MG TABS: 50 | 30 days supply | Qty: 30 | Fill #0

## 2016-10-31 ENCOUNTER — Other Ambulatory Visit (HOSPITAL_COMMUNITY): Payer: Self-pay | Admitting: Orthopedic Surgery

## 2016-10-31 DIAGNOSIS — Z4789 Encounter for other orthopedic aftercare: Secondary | ICD-10-CM

## 2016-11-01 ENCOUNTER — Ambulatory Visit: Payer: 59 | Attending: Orthopedic Surgery | Admitting: Physical Therapy

## 2016-11-01 DIAGNOSIS — M25511 Pain in right shoulder: Secondary | ICD-10-CM | POA: Insufficient documentation

## 2016-11-01 DIAGNOSIS — Z9889 Other specified postprocedural states: Secondary | ICD-10-CM | POA: Diagnosis not present

## 2016-11-01 DIAGNOSIS — M6281 Muscle weakness (generalized): Secondary | ICD-10-CM | POA: Insufficient documentation

## 2016-11-01 DIAGNOSIS — R293 Abnormal posture: Secondary | ICD-10-CM | POA: Diagnosis not present

## 2016-11-01 NOTE — Therapy (Signed)
[place], [place], [address] Phone: [phone]   Fax:  [phone]  Physical Therapy Treatment  Patient Details  Name: Taylor Cain MRN: [id number] Date of Birth: Aug 29, 1961 Referring Provider: Justice Britain  MD  Encounter Date: 11/01/2016      PT End of Session - 11/01/16 0721    Visit Number 9   Number of Visits 25   Date for PT Re-Evaluation 11/22/16   Authorization Type UMR   PT Start Time 0715   PT Stop Time 0804   PT Time Calculation (min) 49 min      Past Medical History:  Diagnosis Date  . Anemia   . Arthritis    right shoulder  . GERD (gastroesophageal reflux disease)   . Heart palpitations    made aware when patient was in 20's-30's; states they are very infrequent and pt. remains asymptomatic  . History of pneumonia   . Seasonal allergies     Past Surgical History:  Procedure Laterality Date  . COLONOSCOPY    . KNEE ARTHROSCOPY Right 1990, 1992   torn acl  . SHOULDER ARTHROSCOPY WITH ROTATOR CUFF REPAIR AND SUBACROMIAL DECOMPRESSION Right 07/25/2016   Procedure: RIGHT SHOULDER ARTHROSCOPY WITH ROTATOR CUFF REPAIR AND SUBACROMIAL DECOMPRESSION, DISTAL CLAVICLE RESECTION;  Surgeon: Justice Britain, MD;  Location: [facility];  Service: Orthopedics;  Laterality: Right;    There were no vitals filed for this visit.      Subjective Assessment - 11/01/16 0718    Subjective I am scheduled for an MRI arthrogram on Feb 12th. MD thinks I might have loosened a suture. 3 days of soreness after last PT session.    Currently in Pain? Yes   Pain Score 1    Pain Location Shoulder   Pain Orientation Right;Anterior   Pain Descriptors / Indicators Sore  pinching    Aggravating Factors  reaching across body, after PT   Pain Relieving Factors rest, meds, ice                          [facility] Adult PT Treatment/Exercise - 11/01/16 0001      Shoulder Exercises: Supine   Other Supine  Exercises triceps 15 x 2 5#      Shoulder Exercises: Prone   Extension Right;10 reps  3 pounds  3 sets   Other Prone Exercises row 3#  20     Shoulder Exercises: Sidelying   External Rotation Right;20 reps   External Rotation Weight (lbs) 3   ABduction --   ABduction Weight (lbs) 2   ABduction Limitations painful catching 3 reps so disc.      Shoulder Exercises: Standing   Flexion 10 reps  3 sets   Shoulder Flexion Weight (lbs) 2  increased to 3 after 1 set   ABduction 10 reps  2 sets   Shoulder ABduction Weight (lbs) 2   Other Standing Exercises plank on counter with hand lifts  at least 15 x each,  blue band abdcution and scapula depression x 20 cue to depress scapula   Other Standing Exercises Rockwood  green x 15 , then ball on wall  circles x 1 minute flexion, 1 minute abduction     Shoulder Exercises: ROM/Strengthening   UBE (Upper Arm Bike) Cybex 100 RPM 6 min no pain  3 min forward and 3 min back     Cryotherapy   Number Minutes Cryotherapy 10 Minutes  Cryotherapy Location Shoulder                  PT Short Term Goals - 10/25/16 1111      PT SHORT TERM GOAL #1   Title She will be independent with inital HEP   Status Revised     PT SHORT TERM GOAL #2   Title She will report able to do progression of HEP without pain   Baseline I am still limiting HEP so she rest RT shoulder   Status On-going     PT SHORT TERM GOAL #3   Title She will be able to progess within protocol when Ok'd by MD   Status Achieved     PT SHORT TERM GOAL #4   Title She will improve strength to 5-/5 min RT shoulder strength.    Time 6   Period Weeks   Status New     PT SHORT TERM GOAL #5   Title She will be able to plank on floor with no pain full weight on RT arm.    Time 6   Period Weeks   Status New           PT Long Term Goals - 10/15/16 1545      PT LONG TERM GOAL #1   Title She will be independent with all HEP issued   Status On-going     PT LONG TERM  GOAL #2   Title She will progress to independent self care with RT arm with 1/10 max pain   Status On-going     PT LONG TERM GOAL #3   Title She will be able to retun to gym with basic lifting program with 1/10 max pain.   Status On-going     PT LONG TERM GOAL #4   Title She will have full active ROM RT shoulder equal LT to return to normal activity   Status Partially Met               Plan - 11/01/16 0756    Clinical Impression Statement Scheduled MRI arthrogram on Feb 12 due to increased pain, popping and weakness. She very sore for several days after last visit. We decreased the weight on some exercises however pt asks to increase the weight as tolerated.   PT Next Visit Plan Progress strength as soreness /pain allows with caution.  Cont ice. If she begins 1x/week starting next week  will begin more emphasis on HEP.   PT Home Exercise Plan PROM for rotation with arm by side and flexion, isometrics, stretch over head and behind back, gentle rockwood,    Consulted and Agree with Plan of Care Patient      Patient will benefit from skilled therapeutic intervention in order to improve the following deficits and impairments:  Impaired UE functional use, Pain, Decreased range of motion, Decreased strength, Decreased activity tolerance  Visit Diagnosis: Status post decompression of subacromial space  S/P right rotator cuff repair  Muscle weakness (generalized)  Right shoulder pain, unspecified chronicity  Abnormal posture     Problem List Patient Active Problem List   Diagnosis Date Noted  . Elevated LFTs 04/21/2015  . Acne vulgaris 01/29/2013  . Anemia 01/29/2013  . MIGRAINE, COMMON 10/15/2007  . PREMATURE VENTRICULAR CONTRACTIONS 10/15/2007  . GERD 10/15/2007    Dorene Ar, PTA 11/01/2016, 8:01 AM  [facility] [address] [place], [place], [address] Phone: [phone]   Fax:  [phone]  Name:  LYRIS HITCHMAN MRN: [id number] Date of Birth: 1961-05-28

## 2016-11-05 ENCOUNTER — Ambulatory Visit: Payer: 59

## 2016-11-05 DIAGNOSIS — R293 Abnormal posture: Secondary | ICD-10-CM | POA: Diagnosis not present

## 2016-11-05 DIAGNOSIS — Z9889 Other specified postprocedural states: Secondary | ICD-10-CM | POA: Diagnosis not present

## 2016-11-05 DIAGNOSIS — M25511 Pain in right shoulder: Secondary | ICD-10-CM | POA: Diagnosis not present

## 2016-11-05 DIAGNOSIS — M6281 Muscle weakness (generalized): Secondary | ICD-10-CM | POA: Diagnosis not present

## 2016-11-05 NOTE — Therapy (Addendum)
[place], [place], [address] Phone: [phone]   Fax:  [phone]  Physical Therapy Treatment  Patient Details  Name: Taylor Cain MRN: [id number] Date of Birth: 03/02/1961 Referring Provider: Justice Britain  MD  Encounter Date: 11/05/2016      PT End of Session - 11/05/16 1717    Visit Number 10   Number of Visits 25   Date for PT Re-Evaluation 11/22/16   Authorization Type UMR   PT Start Time 0430   PT Stop Time 0520   PT Time Calculation (min) 50 min   Activity Tolerance Patient tolerated treatment well;No increased pain   Behavior During Therapy [facility] for tasks assessed/performed      Past Medical History:  Diagnosis Date  . Anemia   . Arthritis    right shoulder  . GERD (gastroesophageal reflux disease)   . Heart palpitations    made aware when patient was in 20's-30's; states they are very infrequent and pt. remains asymptomatic  . History of pneumonia   . Seasonal allergies     Past Surgical History:  Procedure Laterality Date  . COLONOSCOPY    . KNEE ARTHROSCOPY Right 1990, 1992   torn acl  . SHOULDER ARTHROSCOPY WITH ROTATOR CUFF REPAIR AND SUBACROMIAL DECOMPRESSION Right 07/25/2016   Procedure: RIGHT SHOULDER ARTHROSCOPY WITH ROTATOR CUFF REPAIR AND SUBACROMIAL DECOMPRESSION, DISTAL CLAVICLE RESECTION;  Surgeon: Justice Britain, MD;  Location: [place];  Service: Orthopedics;  Laterality: Right;    There were no vitals filed for this visit.          [facility] PT Assessment - 11/05/16 0001      Observation/Other Assessments   Focus on Therapeutic Outcomes (FOTO)  38% improved   9 % change                     [facility] Adult PT Treatment/Exercise - 11/05/16 0001      Shoulder Exercises: Prone   Extension Right;10 reps  2-3-4   pounds    Horizontal ABduction 1 Right;10 reps  2-3-4 pounds and asssisted end range 4 pounds full range end   Other Prone Exercises row 10  pound kettle bell x 10     Shoulder Exercises: Sidelying   External Rotation Right;10 reps  4 pounds 1/2 range x 10 No strain    External Rotation Weight (lbs) 3     Shoulder Exercises: Standing   Flexion Right;10 reps   Shoulder Flexion Weight (lbs) 3   ABduction Right;10 reps   Shoulder ABduction Weight (lbs) 3   Other Standing Exercises plank on edge of mat 20 inch height with hand lifts  at least 20 x each,  blue band abdcution and scapula depression x 20 cue to depress scapula   Other Standing Exercises Rockwood  blue x 15      Shoulder Exercises: Therapy Ball   Other Therapy Ball Exercises using red and yellow plyo ball reaching up wall and doing circles  and x's  x 60 sec     Shoulder Exercises: ROM/Strengthening   UBE (Upper Arm Bike) Cybex 90 RPM 6 min no pain  3 min forward and 3 min back     Cryotherapy   Number Minutes Cryotherapy 10 Minutes   Cryotherapy Location Shoulder                  PT Short Term Goals - 11/05/16 1719      PT SHORT TERM  GOAL #1   Title She will be independent with inital HEP   Status Achieved     PT SHORT TERM GOAL #2   Title She will report able to do progression of HEP without pain   Status Partially Met     PT SHORT TERM GOAL #3   Title She will be able to progess within protocol when Ok'd by MD   Status Achieved     PT SHORT TERM GOAL #4   Title She will improve strength to 5-/5 min RT shoulder strength.    Baseline 4+/5   Status On-going     PT SHORT TERM GOAL #5   Title She will be able to plank on floor with no pain full weight on RT arm.    Baseline on mat today   Status Partially Met           PT Long Term Goals - 10/15/16 1545      PT LONG TERM GOAL #1   Title She will be independent with all HEP issued   Status On-going     PT LONG TERM GOAL #2   Title She will progress to independent self care with RT arm with 1/10 max pain   Status On-going     PT LONG TERM GOAL #3   Title She will be able to  retun to gym with basic lifting program with 1/10 max pain.   Status On-going     PT LONG TERM GOAL #4   Title She will have full active ROM RT shoulder equal LT to return to normal activity   Status Partially Met               Plan - 11/05/16 1718    Clinical Impression Statement No catching or pain today . More fatigue and feeling of strain but no real pain .   Tolerated increased weight.  9% change in FOTO score improved   PT Treatment/Interventions Cryotherapy;Therapeutic exercise;Patient/family education;Manual techniques;Taping;Passive range of motion;Electrical Stimulation   PT Next Visit Plan Progress strength as soreness /pain allows with caution.  Cont ice. If she begins 1x/week starting next week  will begin more emphasis on HEP.  Add body blade   PT Home Exercise Plan PROM for rotation with arm by side and flexion, isometrics, stretch over head and behind back, gentle rockwood,    Consulted and Agree with Plan of Care Patient      Patient will benefit from skilled therapeutic intervention in order to improve the following deficits and impairments:  Impaired UE functional use, Pain, Decreased range of motion, Decreased strength, Decreased activity tolerance  Visit Diagnosis: Status post decompression of subacromial space  S/P right rotator cuff repair  Muscle weakness (generalized)  Right shoulder pain, unspecified chronicity  Abnormal posture     Problem List Patient Active Problem List   Diagnosis Date Noted  . Elevated LFTs 04/21/2015  . Acne vulgaris 01/29/2013  . Anemia 01/29/2013  . MIGRAINE, COMMON 10/15/2007  . PREMATURE VENTRICULAR CONTRACTIONS 10/15/2007  . GERD 10/15/2007    [name]  PT 11/05/2016, 5:31 PM  [facility] [address] [place], [place], [address] Phone: [phone]   Fax:  [phone]  Name: Taylor Cain MRN: [id number] Date of Birth: 1961-06-30

## 2016-11-07 ENCOUNTER — Ambulatory Visit: Payer: 59

## 2016-11-07 DIAGNOSIS — R293 Abnormal posture: Secondary | ICD-10-CM | POA: Diagnosis not present

## 2016-11-07 DIAGNOSIS — M25511 Pain in right shoulder: Secondary | ICD-10-CM | POA: Diagnosis not present

## 2016-11-07 DIAGNOSIS — Z9889 Other specified postprocedural states: Secondary | ICD-10-CM

## 2016-11-07 DIAGNOSIS — M6281 Muscle weakness (generalized): Secondary | ICD-10-CM

## 2016-11-07 NOTE — Therapy (Addendum)
[place], [place], [address] Phone: [phone]   Fax:  [phone]  Physical Therapy Treatment/ Discharge  Patient Details  Name: Taylor Cain MRN: [id number] Date of Birth: 04-Dec-1960 Referring Provider: Justice Britain  MD  Encounter Date: 11/07/2016      PT End of Session - 11/07/16 1507    Visit Number 11   Number of Visits 25   Date for PT Re-Evaluation 11/22/16   Authorization Type UMR   PT Start Time 0303   PT Stop Time 0355   PT Time Calculation (min) 52 min   Activity Tolerance Patient tolerated treatment well   Behavior During Therapy [facility] for tasks assessed/performed      Past Medical History:  Diagnosis Date  . Anemia   . Arthritis    right shoulder  . GERD (gastroesophageal reflux disease)   . Heart palpitations    made aware when patient was in 20's-30's; states they are very infrequent and pt. remains asymptomatic  . History of pneumonia   . Seasonal allergies     Past Surgical History:  Procedure Laterality Date  . COLONOSCOPY    . KNEE ARTHROSCOPY Right 1990, 1992   torn acl  . SHOULDER ARTHROSCOPY WITH ROTATOR CUFF REPAIR AND SUBACROMIAL DECOMPRESSION Right 07/25/2016   Procedure: RIGHT SHOULDER ARTHROSCOPY WITH ROTATOR CUFF REPAIR AND SUBACROMIAL DECOMPRESSION, DISTAL CLAVICLE RESECTION;  Surgeon: Justice Britain, MD;  Location: [place];  Service: Orthopedics;  Laterality: Right;    There were no vitals filed for this visit.      Subjective Assessment - 11/07/16 1508    Subjective no complaints today   Currently in Pain? No/denies                         [facility] Adult PT Treatment/Exercise - 11/07/16 0001      Shoulder Exercises: Supine   Other Supine Exercises triceps 20 x 2 7# , x10     Shoulder Exercises: Prone   Extension Right;10 reps  4 pounds   Horizontal ABduction 1 Right;10 reps  4 pounds then 5 reps asssited end rang/hold /eccentric    Other Prone Exercises row 25 pound kettle bell 2 x 10     Shoulder Exercises: Sidelying   External Rotation Right  25 reps then 15 reps 2 p full radsnge cautioned to stop pain   External Rotation Weight (lbs) 4     Shoulder Exercises: Standing   Other Standing Exercises plank on edge of mat 20 inch height with hand lifts  at least 20 x each,  then walk off ball to  pain free range x 15 then hip shifts RT/Lt  guarded   Other Standing Exercises 30 reps no pain     Shoulder Exercises: ROM/Strengthening   UBE (Upper Arm Bike) 75 RPM 3 min forward and back     Cryotherapy   Number Minutes Cryotherapy 10 Minutes   Cryotherapy Location Shoulder                  PT Short Term Goals - 11/05/16 1719      PT SHORT TERM GOAL #1   Title She will be independent with inital HEP   Status Achieved     PT SHORT TERM GOAL #2   Title She will report able to do progression of HEP without pain   Status Partially Met     PT SHORT TERM GOAL #3  Title She will be able to progess within protocol when Ok'd by MD   Status Achieved     PT SHORT TERM GOAL #4   Title She will improve strength to 5-/5 min RT shoulder strength.    Baseline 4+/5   Status On-going     PT SHORT TERM GOAL #5   Title She will be able to plank on floor with no pain full weight on RT arm.    Baseline on mat today   Status Partially Met           PT Long Term Goals - 10/15/16 1545      PT LONG TERM GOAL #1   Title She will be independent with all HEP issued   Status On-going     PT LONG TERM GOAL #2   Title She will progress to independent self care with RT arm with 1/10 max pain   Status On-going     PT LONG TERM GOAL #3   Title She will be able to retun to gym with basic lifting program with 1/10 max pain.   Status On-going     PT LONG TERM GOAL #4   Title She will have full active ROM RT shoulder equal LT to return to normal activity   Status Partially Met               Plan -  11/07/16 1507    Clinical Impression Statement No pain or catching today with increased weight /pressure with weight bearing. progressing nicely and still gets intermittant catching but none in last 2 session Progress as tolerated, no pain / catch   PT Treatment/Interventions Cryotherapy;Therapeutic exercise;Patient/family education;Manual techniques;Taping;Passive range of motion;Electrical Stimulation   PT Next Visit Plan Progress strength as soreness /pain allows with caution.  Cont ice. If she begins 1x/week starting next week  will begin more emphasis on HEP.  Add body blade   PT Home Exercise Plan PROM for rotation with arm by side and flexion, isometrics, stretch over head and behind back, gentle rockwood,    Consulted and Agree with Plan of Care Patient      Patient will benefit from skilled therapeutic intervention in order to improve the following deficits and impairments:  Impaired UE functional use, Pain, Decreased range of motion, Decreased strength, Decreased activity tolerance  Visit Diagnosis: Status post decompression of subacromial space  S/P right rotator cuff repair  Muscle weakness (generalized)  Right shoulder pain, unspecified chronicity     Problem List Patient Active Problem List   Diagnosis Date Noted  . Elevated LFTs 04/21/2015  . Acne vulgaris 01/29/2013  . Anemia 01/29/2013  . MIGRAINE, COMMON 10/15/2007  . PREMATURE VENTRICULAR CONTRACTIONS 10/15/2007  . GERD 10/15/2007    [name]  PT 11/07/2016, 3:47 PM  [place] [facility] [address] [place], [place], [address] Phone: [phone]   Fax:  [phone]  Name: Taylor Cain MRN: [id number] Date of Birth: 11-24-1960 PHYSICAL THERAPY DISCHARGE SUMMARY  Visits from Start of Care: 11  Current functional level related to goals / functional outcomes: She is to have revision of RTC repair this week   Remaining deficits: See above    Education / Equipment: HEP  Plan: Patient agrees to discharge.  Patient goals were not met. Patient is being discharged due to a change in medical status.  ?????    Lillette Boxer Helmer Dull  PT   12/31/16             !  2:29 PM

## 2016-11-08 ENCOUNTER — Ambulatory Visit (HOSPITAL_COMMUNITY): Payer: 59

## 2016-11-11 ENCOUNTER — Ambulatory Visit (HOSPITAL_COMMUNITY)
Admission: RE | Admit: 2016-11-11 | Discharge: 2016-11-11 | Disposition: A | Discharge status: Home / Self Care | Payer: 59 | Source: Ambulatory Visit | Attending: Orthopedic Surgery | Admitting: Orthopedic Surgery

## 2016-11-11 DIAGNOSIS — Z4789 Encounter for other orthopedic aftercare: Secondary | ICD-10-CM

## 2016-11-11 DIAGNOSIS — M7581 Other shoulder lesions, right shoulder: Secondary | ICD-10-CM | POA: Diagnosis not present

## 2016-11-11 DIAGNOSIS — Z9889 Other specified postprocedural states: Secondary | ICD-10-CM | POA: Diagnosis not present

## 2016-11-11 DIAGNOSIS — M25511 Pain in right shoulder: Secondary | ICD-10-CM | POA: Diagnosis not present

## 2016-11-11 MED ORDER — IOPAMIDOL (ISOVUE-M 200) INJECTION 41%
15.0000 mL | Freq: Once | INTRAMUSCULAR | Status: AC
  Administered 2016-11-11: 15 mL via INTRA_ARTICULAR

## 2016-11-11 MED ORDER — LIDOCAINE HCL (PF) 1 % IJ SOLN
5.0000 mL | Freq: Once | INTRAMUSCULAR | Status: AC
Start: 2016-11-11 — End: 2016-11-11
  Administered 2016-11-11: 5 mL via INTRADERMAL

## 2016-11-11 MED ORDER — GADOBENATE DIMEGLUMINE 529 MG/ML IV SOLN
5.0000 mL | Freq: Once | INTRAVENOUS | Status: AC | PRN
  Administered 2016-11-11: 0.5 mL via INTRA_ARTICULAR

## 2016-11-12 ENCOUNTER — Ambulatory Visit: Payer: 59

## 2016-11-15 ENCOUNTER — Ambulatory Visit: Payer: 59 | Admitting: Physical Therapy

## 2016-11-19 ENCOUNTER — Ambulatory Visit: Payer: 59

## 2016-11-22 ENCOUNTER — Ambulatory Visit: Payer: 59 | Admitting: Physical Therapy

## 2016-11-27 ENCOUNTER — Ambulatory Visit: Payer: 59 | Admitting: Physical Therapy

## 2016-11-29 ENCOUNTER — Encounter: Payer: 59 | Admitting: Physical Therapy

## 2016-12-23 MED FILL — AMOXICILLIN 500 MG CAPSULE: 500 | 30 days supply | Qty: 60 | Fill #3

## 2016-12-26 NOTE — Pre-Procedure Instructions (Signed)
    Taylor Cain  12/26/2016      [place], [place] - [address] [facility]. [address] [place] [address] Phone: [phone] Fax: [phone]  [facility], [place] [address] Phone: [phone] Fax: [phone]  [id number] [place], [place] [address] Phone: [phone] Fax: [phone]   Your procedure is scheduled on Thur. Apr. 5 at 0730 AM. Report to [place] at 530 A.M.  Call this number if you have problems the morning of surgery: [phone]   Remember:  Do not eat food or drink liquids after midnight.  Take these medicines the morning of surgery with A SIP OF WATER amoxicillin (amoxil), diazapam (valium), ondansetron (zofran), Oxycodone (percocet), tramadol (ultram).  Stop taking any Vitamins or Herbal Medications. No Aspirin Goody's, BC's, Aleve, Advil, Motrin, naproxen, Ibuprofen or Fish oil.   Do not wear jewelry, make-up or nail polish.  Do not wear lotions, powders, or perfumes, or deoderant.  Do not shave 48 hours prior to surgery.   Do not bring valuables to the hospital.  [facility] is not responsible for any belongings or valuables.  Contacts, dentures or bridgework may not be worn into surgery.  Leave your suitcase in the car.  After surgery it may be brought to your room.  For patients admitted to the hospital, discharge time will be determined by your treatment team.  Patients discharged the day of surgery will not be allowed to drive home.   Special instructions:  See attached  Please read over the following fact sheets that you were given. Pain Booklet, Coughing and Deep Breathing, MRSA Information and Surgical Site Infection Prevention

## 2016-12-27 ENCOUNTER — Encounter (HOSPITAL_COMMUNITY)
Admission: RE | Admit: 2016-12-27 | Discharge: 2016-12-27 | Disposition: A | Discharge status: Home / Self Care | Payer: 59 | Source: Ambulatory Visit | Attending: Orthopedic Surgery | Admitting: Orthopedic Surgery

## 2016-12-27 ENCOUNTER — Encounter (HOSPITAL_COMMUNITY): Payer: Self-pay

## 2016-12-27 DIAGNOSIS — Z01818 Encounter for other preprocedural examination: Secondary | ICD-10-CM | POA: Diagnosis not present

## 2016-12-27 DIAGNOSIS — M75101 Unspecified rotator cuff tear or rupture of right shoulder, not specified as traumatic: Secondary | ICD-10-CM | POA: Insufficient documentation

## 2016-12-27 LAB — BASIC METABOLIC PANEL
ANION GAP: 9 (ref 5–15)
BUN: 17 mg/dL (ref 6–20)
CALCIUM: 9.5 mg/dL (ref 8.9–10.3)
CHLORIDE: 100 mmol/L — AB (ref 101–111)
CO2: 29 mmol/L (ref 22–32)
Creatinine, Ser: 0.71 mg/dL (ref 0.44–1.00)
GFR calc non Af Amer: >60 mL/min (ref >60)
Glucose, Bld: 96 mg/dL (ref 65–99)
Potassium: 4.3 mmol/L (ref 3.5–5.1)
SODIUM: 138 mmol/L (ref 135–145)

## 2016-12-27 LAB — CBC
HCT: 40.2 % (ref 36.0–46.0)
HEMOGLOBIN: 13.5 g/dL (ref 12.0–15.0)
MCH: 31.5 pg (ref 26.0–34.0)
MCHC: 33.6 g/dL (ref 30.0–36.0)
MCV: 93.9 fL (ref 78.0–100.0)
Platelets: 168 10*3/uL (ref 150–400)
RBC: 4.28 MIL/uL (ref 3.87–5.11)
RDW: 12.4 % (ref 11.5–15.5)
WBC: 4.3 10*3/uL (ref 4.0–10.5)

## 2016-12-27 LAB — SURGICAL PCR SCREEN
MRSA, PCR: NEGATIVE
Staphylococcus aureus: NEGATIVE

## 2016-12-27 MED ORDER — CHLORHEXIDINE GLUCONATE 4 % EX LIQD: 60.0000 mL | Freq: Once | CUTANEOUS | Status: DC

## 2016-12-27 NOTE — Progress Notes (Addendum)
PCP: Dr. Daleen Squibb  Cardiologist: pt denies  EKG: 06/2016 in EPIC  Stress test:pt denies  ECHO: pt denies  Cardiac Cath:  Pt denies  Chest x-ray: pt denies past year

## 2017-01-01 MED ORDER — CEFAZOLIN SODIUM-DEXTROSE 2-4 GM/100ML-% IV SOLN
2.0000 g | INTRAVENOUS | Status: AC
  Administered 2017-01-02: 2 g via INTRAVENOUS
  Filled 2017-01-01: qty 100

## 2017-01-02 ENCOUNTER — Encounter (HOSPITAL_COMMUNITY)
Admission: RE | Disposition: A | Discharge status: Home / Self Care | Payer: Self-pay | Source: Ambulatory Visit | Attending: Orthopedic Surgery

## 2017-01-02 ENCOUNTER — Ambulatory Visit (HOSPITAL_COMMUNITY): Payer: 59 | Admitting: Anesthesiology

## 2017-01-02 ENCOUNTER — Ambulatory Visit (HOSPITAL_COMMUNITY)
Admission: RE | Admit: 2017-01-02 | Discharge: 2017-01-02 | Disposition: A | Discharge status: Home / Self Care | Payer: 59 | Source: Ambulatory Visit | Attending: Orthopedic Surgery | Admitting: Orthopedic Surgery

## 2017-01-02 ENCOUNTER — Encounter (HOSPITAL_COMMUNITY): Payer: Self-pay | Admitting: *Deleted

## 2017-01-02 DIAGNOSIS — Y9389 Activity, other specified: Secondary | ICD-10-CM | POA: Insufficient documentation

## 2017-01-02 DIAGNOSIS — M19011 Primary osteoarthritis, right shoulder: Secondary | ICD-10-CM | POA: Insufficient documentation

## 2017-01-02 DIAGNOSIS — M75101 Unspecified rotator cuff tear or rupture of right shoulder, not specified as traumatic: Secondary | ICD-10-CM | POA: Diagnosis not present

## 2017-01-02 DIAGNOSIS — X509XXA Other and unspecified overexertion or strenuous movements or postures, initial encounter: Secondary | ICD-10-CM | POA: Insufficient documentation

## 2017-01-02 DIAGNOSIS — M75121 Complete rotator cuff tear or rupture of right shoulder, not specified as traumatic: Secondary | ICD-10-CM | POA: Diagnosis not present

## 2017-01-02 DIAGNOSIS — Z833 Family history of diabetes mellitus: Secondary | ICD-10-CM | POA: Diagnosis not present

## 2017-01-02 DIAGNOSIS — M659 Synovitis and tenosynovitis, unspecified: Secondary | ICD-10-CM | POA: Diagnosis not present

## 2017-01-02 DIAGNOSIS — I890 Lymphedema, not elsewhere classified: Secondary | ICD-10-CM | POA: Diagnosis not present

## 2017-01-02 DIAGNOSIS — Z79899 Other long term (current) drug therapy: Secondary | ICD-10-CM | POA: Diagnosis not present

## 2017-01-02 DIAGNOSIS — R600 Localized edema: Secondary | ICD-10-CM | POA: Diagnosis not present

## 2017-01-02 DIAGNOSIS — M7501 Adhesive capsulitis of right shoulder: Secondary | ICD-10-CM | POA: Insufficient documentation

## 2017-01-02 DIAGNOSIS — G8918 Other acute postprocedural pain: Secondary | ICD-10-CM | POA: Diagnosis not present

## 2017-01-02 DIAGNOSIS — R51 Headache: Secondary | ICD-10-CM | POA: Insufficient documentation

## 2017-01-02 DIAGNOSIS — Z820 Family history of epilepsy and other diseases of the nervous system: Secondary | ICD-10-CM | POA: Insufficient documentation

## 2017-01-02 DIAGNOSIS — K219 Gastro-esophageal reflux disease without esophagitis: Secondary | ICD-10-CM | POA: Diagnosis not present

## 2017-01-02 DIAGNOSIS — R002 Palpitations: Secondary | ICD-10-CM | POA: Insufficient documentation

## 2017-01-02 DIAGNOSIS — Z791 Long term (current) use of non-steroidal anti-inflammatories (NSAID): Secondary | ICD-10-CM | POA: Diagnosis not present

## 2017-01-02 DIAGNOSIS — Z8249 Family history of ischemic heart disease and other diseases of the circulatory system: Secondary | ICD-10-CM | POA: Diagnosis not present

## 2017-01-02 DIAGNOSIS — D649 Anemia, unspecified: Secondary | ICD-10-CM | POA: Diagnosis not present

## 2017-01-02 HISTORY — PX: SHOULDER ARTHROSCOPY WITH ROTATOR CUFF REPAIR: SHX5685

## 2017-01-02 SURGERY — ARTHROSCOPY, SHOULDER, WITH ROTATOR CUFF REPAIR
Anesthesia: General | Site: Shoulder | Laterality: Right

## 2017-01-02 MED ORDER — OXYCODONE HCL 5 MG PO TABS
ORAL_TABLET | ORAL | Status: AC
  Filled 2017-01-02: qty 1

## 2017-01-02 MED ORDER — LACTATED RINGERS IV SOLN
INTRAVENOUS | Status: DC | PRN
  Administered 2017-01-02 (×2): via INTRAVENOUS

## 2017-01-02 MED ORDER — 0.9 % SODIUM CHLORIDE (POUR BTL) OPTIME
TOPICAL | Status: DC | PRN
  Administered 2017-01-02: 1000 mL

## 2017-01-02 MED ORDER — PHENYLEPHRINE 40 MCG/ML (10ML) SYRINGE FOR IV PUSH (FOR BLOOD PRESSURE SUPPORT)
PREFILLED_SYRINGE | INTRAVENOUS | Status: AC
  Filled 2017-01-02: qty 10

## 2017-01-02 MED ORDER — LIDOCAINE HCL (CARDIAC) 20 MG/ML IV SOLN
INTRAVENOUS | Status: DC | PRN
  Administered 2017-01-02: 60 mg via INTRAVENOUS

## 2017-01-02 MED ORDER — NAPROXEN 500 MG PO TABS: 500.0000 mg | ORAL_TABLET | Freq: Two times a day (BID) | ORAL | 1 refills | Status: DC

## 2017-01-02 MED ORDER — OXYCODONE HCL 5 MG/5ML PO SOLN: 5.0000 mg | Freq: Once | ORAL | Status: AC | PRN

## 2017-01-02 MED ORDER — OXYCODONE HCL 5 MG PO TABS
5.0000 mg | ORAL_TABLET | Freq: Once | ORAL | Status: AC | PRN
  Administered 2017-01-02: 5 mg via ORAL

## 2017-01-02 MED ORDER — ONDANSETRON HCL 4 MG/2ML IJ SOLN
INTRAMUSCULAR | Status: AC
  Filled 2017-01-02: qty 2

## 2017-01-02 MED ORDER — FENTANYL CITRATE (PF) 100 MCG/2ML IJ SOLN
INTRAMUSCULAR | Status: DC | PRN
  Administered 2017-01-02 (×2): 50 ug via INTRAVENOUS

## 2017-01-02 MED ORDER — BUPIVACAINE-EPINEPHRINE (PF) 0.5% -1:200000 IJ SOLN
INTRAMUSCULAR | Status: DC | PRN
  Administered 2017-01-02: 20 mL via PERINEURAL

## 2017-01-02 MED ORDER — DEXAMETHASONE SODIUM PHOSPHATE 10 MG/ML IJ SOLN
INTRAMUSCULAR | Status: DC | PRN
  Administered 2017-01-02: 4 mg via INTRAVENOUS

## 2017-01-02 MED ORDER — PHENYLEPHRINE HCL 10 MG/ML IJ SOLN
INTRAMUSCULAR | Status: DC | PRN
  Administered 2017-01-02 (×2): 40 ug via INTRAVENOUS

## 2017-01-02 MED ORDER — SODIUM CHLORIDE 0.9 % IR SOLN
Status: DC | PRN
  Administered 2017-01-02 (×6): 3000 mL

## 2017-01-02 MED ORDER — PROPOFOL 10 MG/ML IV BOLUS
INTRAVENOUS | Status: AC
  Filled 2017-01-02: qty 20

## 2017-01-02 MED ORDER — HYDROMORPHONE HCL 1 MG/ML IJ SOLN
INTRAMUSCULAR | Status: AC
  Filled 2017-01-02: qty 0.5

## 2017-01-02 MED ORDER — ONDANSETRON HCL 4 MG/2ML IJ SOLN
INTRAMUSCULAR | Status: DC | PRN
  Administered 2017-01-02: 4 mg via INTRAVENOUS

## 2017-01-02 MED ORDER — MIDAZOLAM HCL 5 MG/5ML IJ SOLN
INTRAMUSCULAR | Status: DC | PRN
  Administered 2017-01-02: 2 mg via INTRAVENOUS

## 2017-01-02 MED ORDER — PROPOFOL 10 MG/ML IV BOLUS
INTRAVENOUS | Status: DC | PRN
  Administered 2017-01-02: 150 mg via INTRAVENOUS

## 2017-01-02 MED ORDER — DEXAMETHASONE SODIUM PHOSPHATE 10 MG/ML IJ SOLN
INTRAMUSCULAR | Status: AC
  Filled 2017-01-02: qty 1

## 2017-01-02 MED ORDER — HYDROMORPHONE HCL 1 MG/ML IJ SOLN
0.2500 mg | INTRAMUSCULAR | Status: DC | PRN
  Administered 2017-01-02: 0.5 mg via INTRAVENOUS

## 2017-01-02 MED ORDER — OXYCODONE-ACETAMINOPHEN 5-325 MG PO TABS: 1.0000 | ORAL_TABLET | ORAL | 0 refills | Status: DC | PRN

## 2017-01-02 MED ORDER — ROCURONIUM BROMIDE 100 MG/10ML IV SOLN
INTRAVENOUS | Status: DC | PRN
  Administered 2017-01-02: 50 mg via INTRAVENOUS

## 2017-01-02 MED ORDER — DIAZEPAM 5 MG PO TABS: 2.5000 mg | ORAL_TABLET | Freq: Four times a day (QID) | ORAL | 1 refills | Status: DC | PRN

## 2017-01-02 MED ORDER — SUGAMMADEX SODIUM 200 MG/2ML IV SOLN
INTRAVENOUS | Status: DC | PRN
  Administered 2017-01-02: 120 mg via INTRAVENOUS

## 2017-01-02 MED ORDER — ONDANSETRON HCL 4 MG/2ML IJ SOLN: 4.0000 mg | Freq: Four times a day (QID) | INTRAMUSCULAR | Status: DC | PRN

## 2017-01-02 MED ORDER — FENTANYL CITRATE (PF) 250 MCG/5ML IJ SOLN
INTRAMUSCULAR | Status: AC
  Filled 2017-01-02: qty 5

## 2017-01-02 MED ORDER — PHENYLEPHRINE HCL 10 MG/ML IJ SOLN
INTRAVENOUS | Status: DC | PRN
  Administered 2017-01-02: 25 ug/min via INTRAVENOUS

## 2017-01-02 MED ORDER — MIDAZOLAM HCL 2 MG/2ML IJ SOLN
INTRAMUSCULAR | Status: AC
  Filled 2017-01-02: qty 2

## 2017-01-02 MED FILL — NAPROXEN 500 MG TABLET: 500 | 30 days supply | Qty: 60 | Fill #0

## 2017-01-02 MED FILL — diazePAM 5 MG TABS: 5 | 10 days supply | Qty: 40 | Fill #0

## 2017-01-02 MED FILL — OXYCODONE W/APAP 5/325 TAB: 5-325 | 4 days supply | Qty: 40 | Fill #0

## 2017-01-02 SURGICAL SUPPLY — 69 items
ANCH SUT SWLK 19.1X4.75 VT (Anchor) ×2 IMPLANT
ANCH SUT SWLK 19.1X5.5 CLS EL (Anchor) ×1 IMPLANT
ANCHOR PEEK 4.75X19.1 SWLK C (Anchor) ×4 IMPLANT
ANCHOR PEEK SWIVEL LOCK 5.5 (Anchor) ×2 IMPLANT
BLADE CUTTER GATOR 3.5 (BLADE) ×3 IMPLANT
BLADE GREAT WHITE 4.2 (BLADE) ×2 IMPLANT
BLADE GREAT WHITE 4.2MM (BLADE) ×1
BLADE SURG 11 STRL SS (BLADE) ×3 IMPLANT
BOOTCOVER CLEANROOM LRG (PROTECTIVE WEAR) ×6 IMPLANT
BUR OVAL 4.0 (BURR) ×3 IMPLANT
CANISTER SUCT LVC 12 LTR MEDI- (MISCELLANEOUS) ×3 IMPLANT
CANNULA ACUFLEX KIT 5X76 (CANNULA) ×3 IMPLANT
CANNULA DRILOCK 5.0MMX75MM (CANNULA) ×1
CANNULA DRILOCK 5.0X75 (CANNULA) ×2 IMPLANT
CANNULA TWIST IN 8.25X7CM (CANNULA) ×3 IMPLANT
CLOSURE WOUND 1/2 X4 (GAUZE/BANDAGES/DRESSINGS) ×1
CONNECTOR 5 IN 1 STRAIGHT STRL (MISCELLANEOUS) ×5 IMPLANT
DRAPE INCISE 23X17 IOBAN STRL (DRAPES) ×2
DRAPE INCISE 23X17 STRL (DRAPES) ×1 IMPLANT
DRAPE INCISE IOBAN 23X17 STRL (DRAPES) ×1 IMPLANT
DRAPE INCISE IOBAN 66X45 STRL (DRAPES) ×3 IMPLANT
DRAPE ORTHO SPLIT 77X108 STRL (DRAPES) ×6
DRAPE STERI 35X30 U-POUCH (DRAPES) ×3 IMPLANT
DRAPE SURG 17X11 SM STRL (DRAPES) ×3 IMPLANT
DRAPE SURG ORHT 6 SPLT 77X108 (DRAPES) ×2 IMPLANT
DRAPE U-SHAPE 47X51 STRL (DRAPES) ×3 IMPLANT
DRSG PAD ABDOMINAL 8X10 ST (GAUZE/BANDAGES/DRESSINGS) ×6 IMPLANT
DURAPREP 26ML APPLICATOR (WOUND CARE) ×6 IMPLANT
GAUZE SPONGE 4X4 12PLY STRL (GAUZE/BANDAGES/DRESSINGS) ×3 IMPLANT
GAUZE SPONGE 4X4 16PLY XRAY LF (GAUZE/BANDAGES/DRESSINGS) ×2 IMPLANT
GLOVE BIO SURGEON STRL SZ7.5 (GLOVE) ×3 IMPLANT
GLOVE BIO SURGEON STRL SZ8 (GLOVE) ×3 IMPLANT
GLOVE EUDERMIC 7 POWDERFREE (GLOVE) ×3 IMPLANT
GLOVE SS BIOGEL STRL SZ 7.5 (GLOVE) ×1 IMPLANT
GLOVE SUPERSENSE BIOGEL SZ 7.5 (GLOVE) ×2
GOWN STRL REUS W/ TWL LRG LVL3 (GOWN DISPOSABLE) ×1 IMPLANT
GOWN STRL REUS W/ TWL XL LVL3 (GOWN DISPOSABLE) ×2 IMPLANT
GOWN STRL REUS W/TWL LRG LVL3 (GOWN DISPOSABLE) ×3
GOWN STRL REUS W/TWL XL LVL3 (GOWN DISPOSABLE) ×6
KIT BASIN OR (CUSTOM PROCEDURE TRAY) ×3 IMPLANT
KIT ROOM TURNOVER OR (KITS) ×3 IMPLANT
KIT SHOULDER TRACTION (DRAPES) ×3 IMPLANT
MANIFOLD NEPTUNE II (INSTRUMENTS) ×3 IMPLANT
NDL SCORPION MULTI FIRE (NEEDLE) IMPLANT
NDL SPNL 18GX3.5 QUINCKE PK (NEEDLE) ×1 IMPLANT
NEEDLE SCORPION MULTI FIRE (NEEDLE) ×3 IMPLANT
NEEDLE SPNL 18GX3.5 QUINCKE PK (NEEDLE) ×6 IMPLANT
NS IRRIG 1000ML POUR BTL (IV SOLUTION) ×3 IMPLANT
PACK SHOULDER (CUSTOM PROCEDURE TRAY) ×3 IMPLANT
PAD ARMBOARD 7.5X6 YLW CONV (MISCELLANEOUS) ×6 IMPLANT
SET ARTHROSCOPY TUBING (MISCELLANEOUS) ×3
SET ARTHROSCOPY TUBING LN (MISCELLANEOUS) ×1 IMPLANT
SLING ARM FOAM STRAP LRG (SOFTGOODS) ×3 IMPLANT
SLING ARM MED ADULT FOAM STRAP (SOFTGOODS) IMPLANT
SPONGE LAP 4X18 X RAY DECT (DISPOSABLE) IMPLANT
STRIP CLOSURE SKIN 1/2X4 (GAUZE/BANDAGES/DRESSINGS) ×2 IMPLANT
SUT MNCRL AB 3-0 PS2 18 (SUTURE) ×3 IMPLANT
SUT PDS AB 0 CT 36 (SUTURE) IMPLANT
SUT RETRIEVER GRASP 30 DEG (SUTURE) IMPLANT
SUT TIGER TAPE 7 IN WHITE (SUTURE) ×2 IMPLANT
SYR 20CC LL (SYRINGE) ×3 IMPLANT
TAPE FIBER 2MM 7IN #2 BLUE (SUTURE) ×2 IMPLANT
TAPE PAPER 3X10 WHT MICROPORE (GAUZE/BANDAGES/DRESSINGS) ×3 IMPLANT
TOWEL OR 17X24 6PK STRL BLUE (TOWEL DISPOSABLE) ×3 IMPLANT
TOWEL OR 17X26 10 PK STRL BLUE (TOWEL DISPOSABLE) ×3 IMPLANT
TUBE CONNECTING 12'X1/4 (SUCTIONS) ×1
TUBE CONNECTING 12X1/4 (SUCTIONS) ×1 IMPLANT
WAND SUCTION MAX 4MM 90S (SURGICAL WAND) ×5 IMPLANT
WATER STERILE IRR 1000ML POUR (IV SOLUTION) ×3 IMPLANT

## 2017-01-02 NOTE — Anesthesia Postprocedure Evaluation (Signed)
Anesthesia Post Note  Patient: Taylor Cain  Procedure(s) Performed: Procedure(s) (LRB): RIGHT SHOULDER ARTHROSCOPY WITH REVISION OF ROTATOR CUFF REPAIR (Right)  Patient location during evaluation: PACU Anesthesia Type: General Level of consciousness: awake and alert and patient cooperative Pain management: pain level controlled Vital Signs Assessment: post-procedure vital signs reviewed and stable Respiratory status: spontaneous breathing and respiratory function stable Cardiovascular status: stable Anesthetic complications: no       Last Vitals:  Vitals:   01/02/17 1015 01/02/17 1030  BP: 119/72 118/76  Pulse: (!) 44 (!) 48  Resp: 10 12  Temp:      Last Pain:  Vitals:   01/02/17 1015  TempSrc:   PainSc: 0-No pain                 Adrean Findlay S

## 2017-01-02 NOTE — Op Note (Signed)
NAMEFLORIDA, NOLTON                     ACCOUNT NO.:  MEDICAL RECORD NO.:  [id number]  LOCATION:                                 FACILITY:  PHYSICIAN:  Metta Clines. Amberia Bayless, M.D.  DATE OF BIRTH:  October 02, 1960  DATE OF PROCEDURE:  01/02/2017 DATE OF DISCHARGE:                              OPERATIVE REPORT   PREOPERATIVE DIAGNOSIS:  Recurrent right shoulder rotator cuff tear.  POSTOPERATIVE DIAGNOSIS:  Recurrent right shoulder rotator cuff tear.  PROCEDURE: 1. Right shoulder examination under anesthesia. 2. Right shoulder clinically joint diagnostic arthroscopy. 3. Arthroscopic subacromial bursectomy with lysis of adhesions. 4. Revision rotator cuff repair using a double-row SutureBridge repair     construct.  SURGEON:  Metta Clines. Cody Oliger, M.D.  Terrence DupontOlivia Mackie A. Shuford, P.A.-C.  ANESTHESIA:  General endotracheal as well as an interscalene block.  ESTIMATED BLOOD LOSS:  Minimal.  DRAINS:  None.  HISTORY:  Ms. Licht is a 56 year old female, who recently underwent right shoulder arthroscopic rotator cuff repair.  She had initially done well but had an unfortunate episode in early postoperative recovery where she inadvertently elevated the right arm with immediate complaints of pain, and subsequently has had persistent discomfort with mechanical symptoms, and subsequent MRI scan did confirm a recurrent tear of the rotator cuff.  She is brought to the operating room at this time for planned right shoulder revision arthroscopy with revision rotator cuff repair as described below.  Preoperatively, I counseled Ms. Dittrich regarding treatment options and potential risks versus benefits thereof.  Possible surgical complications were reviewed including bleeding, infection, vascular injury, persistent pain, loss of motion, anesthetic complication, recurrence of rotator cuff tear, and possible need for additional surgery.  She understands and accepts and agrees with the  planned procedure.  PROCEDURE IN DETAIL:  After undergoing routine preop evaluation, the patient received prophylactic antibiotics and an interscalene block was established in the holding area by the Anesthesia Department.  Placed supine on the operating table.  Underwent smooth induction of general endotracheal anesthesia.  Turned to the left lateral decubitus position on a beanbag and appropriately padded and protected.  Right shoulder examination under anesthesia revealed full motion.  No obvious instability patterns.  Right arm was then suspended at 70 degrees of abduction with 10 pounds of traction.  The right shoulder girdle region was sterilely prepped and draped in standard fashion.  Time-out was called.  Posterior portal was established in the glenohumeral joint and the anterior portal was established under direct visualization.  The articular surface was felt to be in good condition.  There was moderate synovitis.  Limited synovectomy was performed.  There was some minimal degenerative fraying of the labrum superiorly and anteriorly which were also debrided.  Capsular volume, however, was within normal limits. Biceps tendon was stable with normal caliber, no obvious evidence for proximal or distal instability.  We did identify an obvious full- thickness defect of the distal supraspinatus, but no obvious retained suture material on the articular side of the tendon.  We completed the inspection of the glenohumeral joint, no additional pathologies were identified.  Fluid and instruments were then removed.  The arm was  dropped down to 30 degrees of abduction.  Arthroscope introduced into the subacromial space of the posterior portal and a direct lateral portal established in the subacromial space.  Abundant dense bursal tissue and multiple adhesions were encountered.  These were all divided and excised from the shaver and Stryker wand.  We did find some retained suture material  which had actually excavated the undersurface of the acromion by perhaps 1 or 2 mm.  The suture material was mobilized and removed.  I then used the Stryker wand to remove the periosteum from the undersurface of the acromion and then used a bur to smooth the undersurface of the acromion, correcting some of the excavator defects that we noted.  We then completed the subacromial/subdeltoid bursectomy, removing adhesions, completely visualized the bursal surface of the rotator cuff, and again, the tear was readily identified.  We used a shaver to trim the cuff margin back to healthy tissue.  I then identified our medial row anchor, removed the soft tissue from the footprint on the tuberosity and it did not appear as though the width of the footprint had expanded compared to our initial repair.  I then created a portal through a stab wound of the lateral margin of the acromion and introduced a biceps tenodesis screwdriver such that I could extract the medial row anchor and this was removed without difficulty along with the retained suture elements.  This bone tunnel was then carefully cleaned.  We then loaded a 5.5 PEEK SwiveLock suture anchor with 2 FiberTapes and this was then introduced into the previous bone tunnel, attained excellent bony fit and fixation.  I should mention that we confirmed that the rotator cuff had been completely mobilized and easily brought back over the footprint on the tuberosity prior to our refixation.  Again at this point once we had our medial row suture anchor, placed all 6 suture limbs and then shuttled through the free margin of the rotator cuff tear, equidistant across the width of the tear using the Scorpion suture passer and then, we placed two lateral row anchors, these of the 4.75 size, both PEEK and this allowed [place] to nicely reapproximate the rotator cuff tendon against the bony bed and tuberosity, and this construct was much to our satisfaction.   Suture limbs were all then appropriately clipped.  At the completion, the repair was much to our satisfaction.  At this point, the bursectomy was completed.  Hemostasis was obtained.  Fluid and instruments were removed.  The portals were closed with Monocryl and Steri-Strips.  A dry dressing taped at the right shoulder.  Right arm was placed into a sling immobilizer with abduction pillow, and the patient was then awakened, extubated, and taken to the recovery room in stable condition.  Tracy A. Shuford, P.A.-C., was used as an Environmental consultant throughout this case, was essential for help with positioning the patient, positioning the extremity, tissue manipulation, suture management, wound closure, and intraoperative decision making.     Metta Clines. Elfrieda Espino, M.D.     KMS/MEDQ  D:  01/02/2017  T:  01/02/2017  Job:  [id number]

## 2017-01-02 NOTE — Anesthesia Procedure Notes (Signed)
Anesthesia Regional Block: Interscalene brachial plexus block   Pre-Anesthetic Checklist: ,, timeout performed, Correct Patient, Correct Site, Correct Laterality, Correct Procedure, Correct Position, site marked, Risks and benefits discussed,  Surgical consent,  Pre-op evaluation,  At surgeon's request and post-op pain management  Laterality: Right  Prep: chloraprep       Needles:  Injection technique: Single-shot  Needle Type: Echogenic Stimulator Needle     Needle Length: 5cm  Needle Gauge: 22     Additional Needles:   Procedures: ultrasound guided, nerve stimulator,,,,,,   Nerve Stimulator or Paresthesia:  Response: biceps flexion, 0.45 mA,   Additional Responses:   Narrative:  Start time: 01/02/2017 7:06 AM End time: 01/02/2017 7:14 AM Injection made incrementally with aspirations every 5 mL.  Performed by: Personally  Anesthesiologist: Sadako Cegielski  Additional Notes: Functioning IV was confirmed and monitors were applied.  A 75mm 22ga Arrow echogenic stimulator needle was used. Sterile prep and drape,hand hygiene and sterile gloves were used.  Negative aspiration and negative test dose prior to incremental administration of local anesthetic. The patient tolerated the procedure well.  Ultrasound guidance: relevent anatomy identified, needle position confirmed, local anesthetic spread visualized around nerve(s), vascular puncture avoided.  Image printed for medical record.

## 2017-01-02 NOTE — Anesthesia Preprocedure Evaluation (Signed)
Anesthesia Evaluation  Patient identified by MRN, date of birth, ID band Patient awake    Reviewed: Allergy & Precautions, H&P , NPO status , Patient's Chart, lab work & pertinent test results  Airway Mallampati: II   Neck ROM: full    Dental   Pulmonary neg pulmonary ROS,    breath sounds clear to auscultation       Cardiovascular negative cardio ROS   Rhythm:regular Rate:Normal     Neuro/Psych  Headaches,    GI/Hepatic GERD  ,  Endo/Other    Renal/GU      Musculoskeletal  (+) Arthritis ,   Abdominal   Peds  Hematology   Anesthesia Other Findings   Reproductive/Obstetrics                             Anesthesia Physical Anesthesia Plan  ASA: II  Anesthesia Plan: General   Post-op Pain Management: GA combined w/ Regional for post-op pain   Induction: Intravenous  Airway Management Planned: Oral ETT  Additional Equipment:   Intra-op Plan:   Post-operative Plan: Extubation in OR  Informed Consent: I have reviewed the patients History and Physical, chart, labs and discussed the procedure including the risks, benefits and alternatives for the proposed anesthesia with the patient or authorized representative who has indicated his/her understanding and acceptance.     Plan Discussed with: CRNA, Anesthesiologist and Surgeon  Anesthesia Plan Comments:         Anesthesia Quick Evaluation

## 2017-01-02 NOTE — Op Note (Signed)
01/02/2017  9:32 AM  PATIENT:   Taylor Cain  56 y.o. female  PRE-OPERATIVE DIAGNOSIS:  Right recurrent rotator cuff tear  POST-OPERATIVE DIAGNOSIS:  same  PROCEDURE:  RSA, sub acromial bursectomy and LOA, revision RCR  SURGEON:  Jerremy Maione, Metta Clines. M.D.  ASSISTANTS: Shuford pac   ANESTHESIA:   GET + ISB  EBL: min  SPECIMEN:  none  Drains: none   PATIENT DISPOSITION:  PACU - hemodynamically stable.    PLAN OF CARE: Discharge to home after PACU  Dictation# [id number]   Contact # [phone]

## 2017-01-02 NOTE — Transfer of Care (Signed)
Immediate Anesthesia Transfer of Care Note  Patient: Taylor Cain  Procedure(s) Performed: Procedure(s): RIGHT SHOULDER ARTHROSCOPY WITH REVISION OF ROTATOR CUFF REPAIR (Right)  Patient Location: PACU  Anesthesia Type:GA combined with regional for post-op pain  Level of Consciousness: awake and alert   Airway & Oxygen Therapy: Patient Spontanous Breathing and Patient connected to nasal cannula oxygen  Post-op Assessment: Report given to RN and Post -op Vital signs reviewed and stable  Post vital signs: Reviewed and stable  Last Vitals:  Vitals:   01/02/17 0545  BP: (!) 113/56  Pulse: (!) 55  Resp: 18  Temp: 36.6 C    Last Pain:  Vitals:   01/02/17 0545  TempSrc: Oral         Complications: No apparent anesthesia complications

## 2017-01-02 NOTE — Discharge Instructions (Signed)
° °  Metta Clines. Supple, M.D., F.A.A.O.S. Orthopaedic Surgery Specializing in Arthroscopic and Reconstructive Surgery of the Shoulder and Knee [phone] [address] [place], [place] [address] - Fax [phone]  POST-OP SHOULDER ARTHROSCOPIC ROTATOR CUFF AND/OR LABRAL REPAIR INSTRUCTIONS  1. Call the office at [phone] to schedule your first post-op appointment 7-10 days from the date of your surgery.  2. Leave the steri-strips in place over your incisions when performing dressing changes and showering. You may remove your dressings and begin showering 72 hours from surgery. You can expect drainage that is clear to bloody in nature that occasionally will soak through your dressings. If this occurs go ahead and perform a dressing change. The drainage should lessen daily and when there is no drainage from your incisions feel free to go without a dressing.  3. Wear your sling/immobilizer at all times except to perform the exercises below or to occasionally let your arm dangle by your side to stretch your elbow. You also need to sleep in your sling immobilizer until instructed otherwise.  4. Range of motion to your elbow, wrist, and hand are encouraged 3-5 times daily. Exercise to your hand and fingers helps to reduce swelling you may experience.  5. Utilize ice to the shoulder 3-4 times minimum a day and additionally if you are experiencing pain.  6. You may one-armed drive when safely off of narcotics and muscle relaxants. You may use your hand that is in the sling to support the steering wheel only. However, should it be your right arm that is in the sling it is not to be used for gear shifting in a manual transmission.  7. If you had a block pre-operatively to provide post-op pain relief you may want to go ahead and begin utilizing your pain meds as your arm begins to wake up. Blocks can sometimes last up to 16-18 hours. If you are still pain-free prior to going to bed you may  want to strongly consider taking a pain medication to avoid being awakened in the night with the onset of pain. A muscle relaxant is also provided for you should you experience muscle spasms. It is recommended that if you are experiencing pain that your pain medication alone is not controlling, add the muscle relaxant along with the pain medication which can give additional pain relief. The first one to two days is generally the most severe of your pain and then should gradually decrease. As your pain lessens it is recommended that you decrease your use of the pain medications to an "as needed basis" only and to always comply with the recommended dosages of the pain medications.  8. Pain medications can produce constipation along with their use. If you experience this, the use of an over the counter stool softener or laxative daily is recommended.   9. For additional questions or concerns, please do not hesitate to call the office. If after hours there is an answering service to forward your concerns to the physician on call.  POST-OP EXERCISES  Pendulum Exercises  Perform pendulum exercises while standing and bending at the waist. Support your uninvolved arm on a table or chair and allow your operated arm to hang freely. Make sure to do these exercises passively - not using you shoulder muscle.  Repeat 20 times. Do 3 sessions per day. THESE ARE TO BE DONE VERY GENTLY WITH SMALL CIRCLES AND SWINGS ONLY!!!!!

## 2017-01-02 NOTE — Anesthesia Procedure Notes (Signed)
Procedure Name: Intubation Date/Time: 01/02/2017 7:40 AM Performed by: Rejeana Brock L Pre-anesthesia Checklist: Patient identified, Emergency Drugs available, Suction available and Patient being monitored Patient Re-evaluated:Patient Re-evaluated prior to inductionOxygen Delivery Method: Circle System Utilized Preoxygenation: Pre-oxygenation with 100% oxygen Intubation Type: IV induction Ventilation: Mask ventilation without difficulty Laryngoscope Size: Mac and 3 Grade View: Grade I Tube type: Oral Tube size: 7.0 mm Number of attempts: 1 Airway Equipment and Method: Stylet and Oral airway Placement Confirmation: ETT inserted through vocal cords under direct vision,  positive ETCO2 and breath sounds checked- equal and bilateral Secured at: 21 cm Tube secured with: Tape Dental Injury: Teeth and Oropharynx as per pre-operative assessment

## 2017-01-02 NOTE — Progress Notes (Signed)
Orthopedic Tech Progress Note Patient Details:  Taylor Cain 02-17-1961 [id number]  Ortho Devices Type of Ortho Device: Shoulder abduction pillow Ortho Device/Splint Location: shoulder Ortho Device/Splint Interventions: Taylor Cain, Taylor Cain 01/02/2017, 7:55 AM As ordered by Dr. Onnie Graham

## 2017-01-02 NOTE — H&P (Signed)
Taylor Cain    Chief Complaint: Right recurrent rotator cuff tear HPI: The patient is a 56 y.o. female with a recurrent right shoulder rotator cuff tear  Past Medical History:  Diagnosis Date  . Anemia   . Arthritis    right shoulder  . Heart palpitations    made aware when patient was in 20's-30's; states they are very infrequent and pt. remains asymptomatic  . History of pneumonia   . Seasonal allergies     Past Surgical History:  Procedure Laterality Date  . COLONOSCOPY    . KNEE ARTHROSCOPY Right 1990, 1992   torn acl  . SHOULDER ARTHROSCOPY WITH ROTATOR CUFF REPAIR AND SUBACROMIAL DECOMPRESSION Right 07/25/2016   Procedure: RIGHT SHOULDER ARTHROSCOPY WITH ROTATOR CUFF REPAIR AND SUBACROMIAL DECOMPRESSION, DISTAL CLAVICLE RESECTION;  Surgeon: Justice Britain, MD;  Location: [place];  Service: Orthopedics;  Laterality: Right;    Family History  Problem Relation Age of Onset  . Migraines Mother   . Hypertension Mother   . Hypertension Maternal Grandmother   . Alzheimer's disease Maternal Grandmother   . Diabetes Maternal Grandfather   . Colon cancer Neg Hx     Social History:  reports that she has never smoked. She has never used smokeless tobacco. She reports that she drinks about 1.8 oz of alcohol per week . She reports that she does not use drugs.   Medications Prior to Admission  Medication Sig Dispense Refill  . amoxicillin (AMOXIL) 500 MG capsule Take 500 mg by mouth 2 (two) times daily. Therapy continuously  6  . clindamycin (CLINDAGEL) 1 % gel Apply 1 application topically daily.     . diazepam (VALIUM) 5 MG tablet Take 0.5-1 tablets (2.5-5 mg total) by mouth every 6 (six) hours as needed for muscle spasms or sedation. 40 tablet 1  . Ferrous Sulfate (IRON) 325 (65 FE) MG TABS Take 325 mg by mouth 2 (two) times daily.     . pseudoephedrine (SUDAFED) 60 MG tablet Take 60 mg by mouth daily as needed for congestion.    . traMADol (ULTRAM) 50 MG tablet Take 50 mg by  mouth daily as needed for pain.  0  . Cholecalciferol (D3 SUPER STRENGTH) 2000 UNITS CAPS Take 2,000 Units by mouth daily.     . naproxen (NAPROSYN) 500 MG tablet Take 1 tablet (500 mg total) by mouth 2 (two) times daily with a meal. 60 tablet 1  . ondansetron (ZOFRAN) 4 MG tablet Take 1 tablet (4 mg total) by mouth every 8 (eight) hours as needed for nausea or vomiting. (Patient not taking: Reported on 09/19/2016) 20 tablet 0  . oxyCODONE-acetaminophen (PERCOCET) 5-325 MG tablet Take 1-2 tablets by mouth every 4 (four) hours as needed. (Patient not taking: Reported on 09/19/2016) 40 tablet 0     Physical Exam: right shoulder with pain and restricted mobility as noted at recent office visits  Vitals  Temp:  [97.8 F (36.6 C)] 97.8 F (36.6 C) (04/05 0545) Pulse Rate:  [55] 55 (04/05 0545) Resp:  [18] 18 (04/05 0545) BP: (113)/(56) 113/56 (04/05 0545) SpO2:  [100 %] 100 % (04/05 0545) Weight:  [60.8 kg (134 lb)] 60.8 kg (134 lb) (04/05 0545)  Assessment/Plan  Impression: Right recurrent rotator cuff tear  Plan of Action: Procedure(s): RIGHT SHOULDER ARTHROSCOPY WITH REVISION OF ROTATOR CUFF REPAIR  Ygnacio Fecteau M Stevens [place] 01/02/2017, 7:25 AM Contact # [phone]

## 2017-01-03 ENCOUNTER — Encounter (HOSPITAL_COMMUNITY): Payer: Self-pay | Admitting: Orthopedic Surgery

## 2017-01-13 DIAGNOSIS — Z791 Long term (current) use of non-steroidal anti-inflammatories (NSAID): Secondary | ICD-10-CM | POA: Diagnosis not present

## 2017-01-13 DIAGNOSIS — R600 Localized edema: Secondary | ICD-10-CM | POA: Diagnosis not present

## 2017-01-13 DIAGNOSIS — M19011 Primary osteoarthritis, right shoulder: Secondary | ICD-10-CM | POA: Diagnosis not present

## 2017-01-13 DIAGNOSIS — I890 Lymphedema, not elsewhere classified: Secondary | ICD-10-CM | POA: Diagnosis not present

## 2017-01-13 DIAGNOSIS — M75121 Complete rotator cuff tear or rupture of right shoulder, not specified as traumatic: Secondary | ICD-10-CM | POA: Diagnosis not present

## 2017-01-23 DIAGNOSIS — R600 Localized edema: Secondary | ICD-10-CM | POA: Diagnosis not present

## 2017-01-23 DIAGNOSIS — M75121 Complete rotator cuff tear or rupture of right shoulder, not specified as traumatic: Secondary | ICD-10-CM | POA: Diagnosis not present

## 2017-01-23 DIAGNOSIS — M19011 Primary osteoarthritis, right shoulder: Secondary | ICD-10-CM | POA: Diagnosis not present

## 2017-01-23 DIAGNOSIS — Z791 Long term (current) use of non-steroidal anti-inflammatories (NSAID): Secondary | ICD-10-CM | POA: Diagnosis not present

## 2017-01-23 DIAGNOSIS — I890 Lymphedema, not elsewhere classified: Secondary | ICD-10-CM | POA: Diagnosis not present

## 2017-02-04 MED FILL — AMOXICILLIN 500 MG CAPSULE: 500 | 30 days supply | Qty: 60 | Fill #4

## 2017-02-05 DIAGNOSIS — R600 Localized edema: Secondary | ICD-10-CM | POA: Diagnosis not present

## 2017-02-05 DIAGNOSIS — M75121 Complete rotator cuff tear or rupture of right shoulder, not specified as traumatic: Secondary | ICD-10-CM | POA: Diagnosis not present

## 2017-02-05 DIAGNOSIS — M19011 Primary osteoarthritis, right shoulder: Secondary | ICD-10-CM | POA: Diagnosis not present

## 2017-02-05 DIAGNOSIS — I890 Lymphedema, not elsewhere classified: Secondary | ICD-10-CM | POA: Diagnosis not present

## 2017-02-05 DIAGNOSIS — Z791 Long term (current) use of non-steroidal anti-inflammatories (NSAID): Secondary | ICD-10-CM | POA: Diagnosis not present

## 2017-02-10 MED FILL — diazePAM 5 MG TABS: 5 | 10 days supply | Qty: 40 | Fill #1

## 2017-02-18 DIAGNOSIS — H52223 Regular astigmatism, bilateral: Secondary | ICD-10-CM | POA: Diagnosis not present

## 2017-02-25 MED FILL — traMADol HCL 50 MG TABS: 50 | 20 days supply | Qty: 40 | Fill #0

## 2017-02-26 ENCOUNTER — Ambulatory Visit: Payer: 59 | Attending: Orthopedic Surgery | Admitting: Rehabilitative and Restorative Service Providers"

## 2017-02-26 ENCOUNTER — Encounter: Payer: Self-pay | Admitting: Rehabilitative and Restorative Service Providers"

## 2017-02-26 DIAGNOSIS — M25511 Pain in right shoulder: Secondary | ICD-10-CM | POA: Insufficient documentation

## 2017-02-26 DIAGNOSIS — G8929 Other chronic pain: Secondary | ICD-10-CM | POA: Insufficient documentation

## 2017-02-26 DIAGNOSIS — M6281 Muscle weakness (generalized): Secondary | ICD-10-CM | POA: Diagnosis not present

## 2017-02-26 NOTE — Patient Instructions (Signed)
Advised pt HEP to be issued at next visit to see how she responded to current treatment. Advised her to perform scap retract to assist with improving scapular kinematics. No handout issued. Performed in clinic and pt able to return demonstrate.

## 2017-02-26 NOTE — Therapy (Signed)
[place], [place], [address] Phone: [phone]   Fax:  [phone]  Physical Therapy Evaluation  Patient Details  Name: Taylor Cain MRN: [id number] Date of Birth: 1961-03-18 Referring Provider: Onnie Graham  Encounter Date: 02/26/2017      PT End of Session - 02/26/17 1724    Visit Number 1   Number of Visits 8   Date for PT Re-Evaluation 04/23/17   PT Start Time 1628   PT Stop Time 2376   PT Time Calculation (min) 46 min   Activity Tolerance Patient tolerated treatment well;No increased pain   Behavior During Therapy WFL for tasks assessed/performed      Past Medical History:  Diagnosis Date  . Anemia   . Arthritis    right shoulder  . Heart palpitations    made aware when patient was in 20's-30's; states they are very infrequent and pt. remains asymptomatic  . History of pneumonia   . Seasonal allergies     Past Surgical History:  Procedure Laterality Date  . COLONOSCOPY    . KNEE ARTHROSCOPY Right 1990, 1992   torn acl  . SHOULDER ARTHROSCOPY WITH ROTATOR CUFF REPAIR Right 01/02/2017   Procedure: RIGHT SHOULDER ARTHROSCOPY WITH REVISION OF ROTATOR CUFF REPAIR;  Surgeon: Justice Britain, MD;  Location: [place];  Service: Orthopedics;  Laterality: Right;  . SHOULDER ARTHROSCOPY WITH ROTATOR CUFF REPAIR AND SUBACROMIAL DECOMPRESSION Right 07/25/2016   Procedure: RIGHT SHOULDER ARTHROSCOPY WITH ROTATOR CUFF REPAIR AND SUBACROMIAL DECOMPRESSION, DISTAL CLAVICLE RESECTION;  Surgeon: Justice Britain, MD;  Location: [place];  Service: Orthopedics;  Laterality: Right;    There were no vitals filed for this visit.       Subjective Assessment - 02/26/17 1631    Subjective January 02, 2017 R  RCR  with scope; pt wears sling when at work to limit activities and has modified desktop to manage R shoulder reinjury. Pt had prior RCR that failed Oct. 26, 2017.   Pertinent History Prior R RCR that failed 07/25/16 due to  tensile strength overload.    Limitations Reading;House hold activities;Writing   How long can you sit comfortably? undetermined amount of time   How long can you stand comfortably? undetermined amoun tof time   How long can you walk comfortably? undetermined amount of time   Diagnostic tests initially with MD consultation   Patient Stated Goals to get my arm back to normal   Currently in Pain? Yes   Pain Score 3    Pain Location Shoulder   Pain Orientation Right   Pain Descriptors / Indicators Spasm;Pins and needles   Pain Type Chronic pain   Pain Radiating Towards shoulder to bicep R   Pain Onset More than a month ago   Pain Frequency Intermittent   Aggravating Factors  washing her hair, sleeping with hand under pillow   Pain Relieving Factors meds, rest   Effect of Pain on Daily Activities stops all aggravating activities            [facility] PT Assessment - 02/26/17 0001      Assessment   Medical Diagnosis R scope with RCR   Referring Provider Supple   Onset Date/Surgical Date 01/02/17   Hand Dominance Right   Next MD Visit 03/10/17   Prior Therapy none     Precautions   Precautions None     Restrictions   Weight Bearing Restrictions No     Balance Screen   Has the  patient fallen in the past 6 months No     [place] residence     Prior Function   Level of Independence Independent  with compensation     Cognition   Overall Cognitive Status Within Functional Limits for tasks assessed     Coordination   Gross Motor Movements are Fluid and Coordinated Yes     Posture/Postural Control   Posture Comments rounded R humeral head, great posture     ROM / Strength   AROM / PROM / Strength AROM;PROM;Strength     AROM   Overall AROM Comments R shoulder AROM WNL and even bil but with hesitation of movement at end ROM with elbow flexion but pt able to extend when cued; pt with hesitancy of movement (decreased intiation all planes  when movement of R arm); R IR to T12 and L to T6     PROM   Overall PROM Comments WNL without restriction palpated     Strength   Overall Strength Comments R flex/abdct 3+/5, elbow flex 3+, IR/ER 4-/3+     Palpation   Palpation comment r scapula depressed, R humeral head ant rotated, scap mobility good            Objective measurements completed on examination: See above findings.          [facility] Adult PT Treatment/Exercise - 02/26/17 0001      Exercises   Exercises Shoulder     Shoulder Exercises: Supine   Other Supine Exercises R shoulder ext isometric x 20 with 3-5 sec holds; RTB chest pull x 20; RTB R ER x 20; YTB R shoulder flex from hip level to eye level x 20, 1 lb scap protraction x 20, 1 lb scap protraction with limited ROM horiz abdct/addct x 20     Shoulder Exercises: Sidelying   Other Sidelying Exercises 1 lb R ER in neutral x 20                  PT Short Term Goals - 02/26/17 1720      PT SHORT TERM GOAL #1   Title She will be independent with inital HEP   Time 2   Period Weeks   Status New     PT SHORT TERM GOAL #2   Title Pt will be able to hold cup of coffee without R shoulder pain   Baseline 3/10   Time 2   Period Weeks   Status New     PT SHORT TERM GOAL #3   Title Pt will demo improved R shoulder strength to 4-/5 throughout to assist with improved functional mobility   Baseline elbow flex/shoulder flex, abdct, ER 3+/5   Time 2   Period Weeks   Status New           PT Long Term Goals - 02/26/17 1721      PT LONG TERM GOAL #1   Title Pt will be able to wash hair without pain   Baseline up to 3/10   Time 4   Period Weeks   Status New     PT LONG TERM GOAL #2   Title Pt will be able to cut food up without pain   Baseline 3/10 pain   Time 4   Period Weeks   Status New     PT LONG TERM GOAL #3   Title pt will be I with advanced HEP for continued strengthening to assist with  iADLs   Baseline scap retract issued at  eval   Time 4   Period Weeks   Status New     PT LONG TERM GOAL #4   Title Pt will be able to perform R shoulder IR in standing/sitting to reach T8 to assist with dressing   Baseline T12   Time 4   Period Weeks   Status New                Plan - 02/26/17 1716    Clinical Impression Statement Pt presents to PT s/p R shoulder scope with RCR 01/02/17 and demonstrates full A/PROM with exception of R IR. Pt is weak along periscapular and R shoulder muscles which hinders functional mobility. Pt is hesitant with all mobility due to failed prior RCR. Pt would benefit from PT for R shoulder strengthening, bicep strengthening per protocol. Pt is currently 8 weeks post op.   History and Personal Factors relevant to plan of care: R RCR 01/02/17   Clinical Presentation Stable   Clinical Decision Making Low   Rehab Potential Excellent   PT Frequency 2x / week   PT Duration 4 weeks   PT Treatment/Interventions Cryotherapy;Functional mobility training;Patient/family education;Therapeutic exercise;Therapeutic activities;Manual techniques;Taping;Ultrasound;Electrical Stimulation   PT Next Visit Plan issue HEP pending pt tolerance of today's visit   PT Home Exercise Plan scap retract   Consulted and Agree with Plan of Care Patient      Patient will benefit from skilled therapeutic intervention in order to improve the following deficits and impairments:  Decreased activity tolerance, Decreased strength, Impaired UE functional use, Pain  Visit Diagnosis: Muscle weakness (generalized) - Plan: PT plan of care cert/re-cert  Chronic right shoulder pain - Plan: PT plan of care cert/re-cert     Problem List Patient Active Problem List   Diagnosis Date Noted  . Elevated LFTs 04/21/2015  . Acne vulgaris 01/29/2013  . Anemia 01/29/2013  . MIGRAINE, COMMON 10/15/2007  . PREMATURE VENTRICULAR CONTRACTIONS 10/15/2007  . GERD 10/15/2007    Myra Rude, PT 02/26/2017, 5:30 PM  [facility] [address] [place], [place], [address] Phone: [phone]   Fax:  [phone]  Name: NAJAI WASZAK MRN: [id number] Date of Birth: [birth year]

## 2017-03-07 ENCOUNTER — Ambulatory Visit: Payer: 59 | Attending: Orthopedic Surgery | Admitting: Physical Therapy

## 2017-03-07 DIAGNOSIS — M6281 Muscle weakness (generalized): Secondary | ICD-10-CM | POA: Insufficient documentation

## 2017-03-07 DIAGNOSIS — G8929 Other chronic pain: Secondary | ICD-10-CM | POA: Diagnosis not present

## 2017-03-07 DIAGNOSIS — Z9889 Other specified postprocedural states: Secondary | ICD-10-CM | POA: Insufficient documentation

## 2017-03-07 DIAGNOSIS — M25511 Pain in right shoulder: Secondary | ICD-10-CM | POA: Insufficient documentation

## 2017-03-07 NOTE — Patient Instructions (Signed)
Side Pull: Double Arm   On back, knees bent, feet flat. Arms perpendicular to body, shoulder level, elbows straight but relaxed. Pull arms out to sides, elbows straight. Resistance band comes across collarbones, hands toward floor. Hold momentarily. Slowly return to starting position. Repeat _10__ times. Band color __R___     Strengthening: Resisted Internal Rotation   Hold tubing in left hand, elbow at side and forearm out. Rotate forearm in across body. Repeat __10__ times per set. Do __2__ sets per session. Do ___2_ sessions per day.  http://orth.exer.us/830   Copyright  VHI. All rights reserved.  Strengthening: Resisted External Rotation   Hold tubing in right hand, elbow at side and forearm across body. Rotate forearm out. Repeat _10___ times per set. Do __2__ sets per session. Do __2__ sessions per day.     Resistive Band Rowing   With resistive band anchored in door, grasp both ends. Keeping elbows bent, pull back, squeezing shoulder blades together. Hold __5__ seconds. Repeat __10__ times. Do _2__ sessions per day.

## 2017-03-07 NOTE — Therapy (Signed)
[place], [place], [address] Phone: [phone]   Fax:  [phone]  Physical Therapy Treatment  Patient Details  Name: Taylor Cain MRN: [id number] Date of Birth: November 21, 1960 Referring Provider: Onnie Graham  Encounter Date: 03/07/2017      PT End of Session - 03/07/17 0717    Visit Number 2   Number of Visits 8   Date for PT Re-Evaluation 04/23/17   PT Start Time 0715   PT Stop Time 0805   PT Time Calculation (min) 50 min      Past Medical History:  Diagnosis Date  . Anemia   . Arthritis    right shoulder  . Heart palpitations    made aware when patient was in 20's-30's; states they are very infrequent and pt. remains asymptomatic  . History of pneumonia   . Seasonal allergies     Past Surgical History:  Procedure Laterality Date  . COLONOSCOPY    . KNEE ARTHROSCOPY Right 1990, 1992   torn acl  . SHOULDER ARTHROSCOPY WITH ROTATOR CUFF REPAIR Right 01/02/2017   Procedure: RIGHT SHOULDER ARTHROSCOPY WITH REVISION OF ROTATOR CUFF REPAIR;  Surgeon: Justice Britain, MD;  Location: [place];  Service: Orthopedics;  Laterality: Right;  . SHOULDER ARTHROSCOPY WITH ROTATOR CUFF REPAIR AND SUBACROMIAL DECOMPRESSION Right 07/25/2016   Procedure: RIGHT SHOULDER ARTHROSCOPY WITH ROTATOR CUFF REPAIR AND SUBACROMIAL DECOMPRESSION, DISTAL CLAVICLE RESECTION;  Surgeon: Justice Britain, MD;  Location: [place];  Service: Orthopedics;  Laterality: Right;    There were no vitals filed for this visit.      Subjective Assessment - 03/07/17 0716    Subjective No soreness or pain after last visit.    Currently in Pain? No/denies                         [facility] Adult PT Treatment/Exercise - 03/07/17 0001      Shoulder Exercises: Supine   Other Supine Exercises  RTB chest pull x 20; RTB R ER x 20;  from, 1 lb scap protraction x 20, 1 lb scap protraction      Shoulder Exercises: Sidelying   Other Sidelying  Exercises 1 lb R ER in neutral x 20     Shoulder Exercises: Standing   External Rotation 10 reps   Theraband Level (Shoulder External Rotation) Level 2 (Red)   Internal Rotation 10 reps   Theraband Level (Shoulder Internal Rotation) Level 2 (Red)   Row 10 reps   Theraband Level (Shoulder Row) Level 2 (Red)   Row Limitations right only      Shoulder Exercises: Isometric Strengthening   Flexion 5X10"   Extension 5X10"   External Rotation 5X10"   Internal Rotation 5X10"   ABduction --     Manual Therapy   Manual therapy comments PROM Felx, abdct, ER, IR                   PT Short Term Goals - 02/26/17 1720      PT SHORT TERM GOAL #1   Title She will be independent with inital HEP   Time 2   Period Weeks   Status New     PT SHORT TERM GOAL #2   Title Pt will be able to hold cup of coffee without R shoulder pain   Baseline 3/10   Time 2   Period Weeks   Status New     PT SHORT TERM GOAL #  3   Title Pt will demo improved R shoulder strength to 4-/5 throughout to assist with improved functional mobility   Baseline elbow flex/shoulder flex, abdct, ER 3+/5   Time 2   Period Weeks   Status New           PT Long Term Goals - 02/26/17 1721      PT LONG TERM GOAL #1   Title Pt will be able to wash hair without pain   Baseline up to 3/10   Time 4   Period Weeks   Status New     PT LONG TERM GOAL #2   Title Pt will be able to cut food up without pain   Baseline 3/10 pain   Time 4   Period Weeks   Status New     PT LONG TERM GOAL #3   Title pt will be I with advanced HEP for continued strengthening to assist with iADLs   Baseline scap retract issued at eval   Time 4   Period Weeks   Status New     PT LONG TERM GOAL #4   Title Pt will be able to perform R shoulder IR in standing/sitting to reach T8 to assist with dressing   Baseline T12   Time 4   Period Weeks   Status New               Plan - 03/07/17 1610    Clinical Impression  Statement Pt reports no pain or soreness after last visit. Established HEP for strengthening and advised pt to stop if painful.    PT Next Visit Plan RCR Protocol    PT Home Exercise Plan scap retract, Red band supine horizontal abduction, [place] (except flexion) red band     Consulted and Agree with Plan of Care Patient      Patient will benefit from skilled therapeutic intervention in order to improve the following deficits and impairments:  Decreased activity tolerance, Decreased strength, Impaired UE functional use, Pain  Visit Diagnosis: Muscle weakness (generalized)  Chronic right shoulder pain     Problem List Patient Active Problem List   Diagnosis Date Noted  . Elevated LFTs 04/21/2015  . Acne vulgaris 01/29/2013  . Anemia 01/29/2013  . MIGRAINE, COMMON 10/15/2007  . PREMATURE VENTRICULAR CONTRACTIONS 10/15/2007  . GERD 10/15/2007    Dorene Ar, PTA 03/07/2017, 8:09 AM  [facility] [address] [place], [place], [address] Phone: [phone]   Fax:  [phone]  Name: Taylor Cain MRN: [id number] Date of Birth: 1961-02-21

## 2017-03-11 ENCOUNTER — Ambulatory Visit: Payer: 59

## 2017-03-11 DIAGNOSIS — Z9889 Other specified postprocedural states: Secondary | ICD-10-CM

## 2017-03-11 DIAGNOSIS — G8929 Other chronic pain: Secondary | ICD-10-CM

## 2017-03-11 DIAGNOSIS — M6281 Muscle weakness (generalized): Secondary | ICD-10-CM | POA: Diagnosis not present

## 2017-03-11 DIAGNOSIS — M25511 Pain in right shoulder: Secondary | ICD-10-CM | POA: Diagnosis not present

## 2017-03-11 MED FILL — AMOXICILLIN 500 MG CAPSULE: 500 | 30 days supply | Qty: 60 | Fill #5

## 2017-03-11 NOTE — Therapy (Signed)
[place], [place], [address] Phone: [phone]   Fax:  [phone]  Physical Therapy Treatment  Patient Details  Name: Taylor Cain MRN: [id number] Date of Birth: [birth year] Referring Provider: Onnie Graham  Encounter Date: 03/11/2017      PT End of Session - 03/11/17 1619    Visit Number 3   Number of Visits 8   Date for PT Re-Evaluation 04/23/17   PT Start Time 0348   PT Stop Time 0430   PT Time Calculation (min) 42 min   Activity Tolerance Patient tolerated treatment well;No increased pain   Behavior During Therapy WFL for tasks assessed/performed      Past Medical History:  Diagnosis Date  . Anemia   . Arthritis    right shoulder  . Heart palpitations    made aware when patient was in 20's-30's; states they are very infrequent and pt. remains asymptomatic  . History of pneumonia   . Seasonal allergies     Past Surgical History:  Procedure Laterality Date  . COLONOSCOPY    . KNEE ARTHROSCOPY Right 1990, 1992   torn acl  . SHOULDER ARTHROSCOPY WITH ROTATOR CUFF REPAIR Right 01/02/2017   Procedure: RIGHT SHOULDER ARTHROSCOPY WITH REVISION OF ROTATOR CUFF REPAIR;  Surgeon: Justice Britain, MD;  Location: [place];  Service: Orthopedics;  Laterality: Right;  . SHOULDER ARTHROSCOPY WITH ROTATOR CUFF REPAIR AND SUBACROMIAL DECOMPRESSION Right 07/25/2016   Procedure: RIGHT SHOULDER ARTHROSCOPY WITH ROTATOR CUFF REPAIR AND SUBACROMIAL DECOMPRESSION, DISTAL CLAVICLE RESECTION;  Surgeon: Justice Britain, MD;  Location: [place];  Service: Orthopedics;  Laterality: Right;    There were no vitals filed for this visit.                       [facility] Adult PT Treatment/Exercise - 03/11/17 0001      Shoulder Exercises: Standing   External Rotation Right;15 reps   Theraband Level (Shoulder External Rotation) Level 2 (Red)   Internal Rotation 15 reps;Right   Theraband Level (Shoulder Internal Rotation)  Level 2 (Red)   Row Right;15 reps   Theraband Level (Shoulder Row) Level 2 (Red)   Shoulder Elevation Limitations  x10   Other Standing Exercises flexion and abduction below 90 degrees 1 ound x 10 and 2 pounds x 10,   Other Standing Exercises stretching with sliding hand over head and ER with hor abduction     Shoulder Exercises: ROM/Strengthening   Rebounder L1 3 mn for 3 back     Shoulder Exercises: Isometric Strengthening   Flexion 5X10"   Extension 5X10"   External Rotation 5X10"   Internal Rotation 5X10"   ABduction 5X10"      cold pack x 10 min RT shoulder            PT Short Term Goals - 03/11/17 1620      PT SHORT TERM GOAL #1   Title She will be independent with inital HEP   Status Achieved           PT Long Term Goals - 02/26/17 1721      PT LONG TERM GOAL #1   Title Pt will be able to wash hair without pain   Baseline up to 3/10   Time 4   Period Weeks   Status New     PT LONG TERM GOAL #2   Title Pt will be able to cut food up without pain   Baseline 3/10  pain   Time 4   Period Weeks   Status New     PT LONG TERM GOAL #3   Title pt will be I with advanced HEP for continued strengthening to assist with iADLs   Baseline scap retract issued at eval   Time 4   Period Weeks   Status New     PT LONG TERM GOAL #4   Title Pt will be able to perform R shoulder IR in standing/sitting to reach T8 to assist with dressing   Baseline T12   Time 4   Period Weeks   Status New               Plan - 03/11/17 1619    Clinical Impression Statement After exercise reported no pain but sense of tension so stopped and iced.  Cautioned at home to have no pain an d stop activity  if feels tight .    PT Treatment/Interventions Cryotherapy;Functional mobility training;Patient/family education;Therapeutic exercise;Therapeutic activities;Manual techniques;Taping;Ultrasound;Electrical Stimulation   PT Next Visit Plan RCR Protocol    PT Home Exercise Plan  scap retract, Red band supine horizontal abduction, [place] (except flexion) red band     Consulted and Agree with Plan of Care Patient      Patient will benefit from skilled therapeutic intervention in order to improve the following deficits and impairments:  Decreased activity tolerance, Decreased strength, Impaired UE functional use, Pain  Visit Diagnosis: Muscle weakness (generalized)  Chronic right shoulder pain  Status post decompression of subacromial space  S/P right rotator cuff repair  Right shoulder pain, unspecified chronicity     Problem List Patient Active Problem List   Diagnosis Date Noted  . Elevated LFTs 04/21/2015  . Acne vulgaris 01/29/2013  . Anemia 01/29/2013  . MIGRAINE, COMMON 10/15/2007  . PREMATURE VENTRICULAR CONTRACTIONS 10/15/2007  . GERD 10/15/2007    [name]  PT 03/11/2017, 4:22 PM  [facility] [address] [place], [place], [address] Phone: [phone]   Fax:  [phone]  Name: Taylor Cain MRN: [id number] Date of Birth: September 09, 1961

## 2017-03-13 ENCOUNTER — Ambulatory Visit: Payer: 59

## 2017-03-13 DIAGNOSIS — Z9889 Other specified postprocedural states: Secondary | ICD-10-CM | POA: Diagnosis not present

## 2017-03-13 DIAGNOSIS — M6281 Muscle weakness (generalized): Secondary | ICD-10-CM | POA: Diagnosis not present

## 2017-03-13 DIAGNOSIS — M25511 Pain in right shoulder: Secondary | ICD-10-CM | POA: Diagnosis not present

## 2017-03-13 DIAGNOSIS — G8929 Other chronic pain: Secondary | ICD-10-CM | POA: Diagnosis not present

## 2017-03-13 MED FILL — NAPROXEN 500 MG TABLET: 500 | 30 days supply | Qty: 60 | Fill #1

## 2017-03-13 NOTE — Therapy (Signed)
[place], [place], [address] Phone: [phone]   Fax:  [phone]  Physical Therapy Treatment  Patient Details  Name: Taylor Cain MRN: [id number] Date of Birth: 19-Jan-1961 Referring Provider: Onnie Graham  Encounter Date: 03/13/2017      PT End of Session - 03/13/17 1629    Visit Number 4   Number of Visits 8   Date for PT Re-Evaluation 04/23/17   PT Start Time 0430   PT Stop Time 0523   PT Time Calculation (min) 53 min   Activity Tolerance Patient tolerated treatment well;No increased pain   Behavior During Therapy WFL for tasks assessed/performed      Past Medical History:  Diagnosis Date  . Anemia   . Arthritis    right shoulder  . Heart palpitations    made aware when patient was in 20's-30's; states they are very infrequent and pt. remains asymptomatic  . History of pneumonia   . Seasonal allergies     Past Surgical History:  Procedure Laterality Date  . COLONOSCOPY    . KNEE ARTHROSCOPY Right 1990, 1992   torn acl  . SHOULDER ARTHROSCOPY WITH ROTATOR CUFF REPAIR Right 01/02/2017   Procedure: RIGHT SHOULDER ARTHROSCOPY WITH REVISION OF ROTATOR CUFF REPAIR;  Surgeon: Justice Britain, MD;  Location: [place];  Service: Orthopedics;  Laterality: Right;  . SHOULDER ARTHROSCOPY WITH ROTATOR CUFF REPAIR AND SUBACROMIAL DECOMPRESSION Right 07/25/2016   Procedure: RIGHT SHOULDER ARTHROSCOPY WITH ROTATOR CUFF REPAIR AND SUBACROMIAL DECOMPRESSION, DISTAL CLAVICLE RESECTION;  Surgeon: Justice Britain, MD;  Location: [place];  Service: Orthopedics;  Laterality: Right;    There were no vitals filed for this visit.      Subjective Assessment - 03/13/17 1631    Subjective Sore yesterday but no need for meds or cold. No pain now   Currently in Pain? No/denies                         [facility] Adult PT Treatment/Exercise - 03/13/17 0001      Therapeutic Activites    Therapeutic Activities Other  Therapeutic Activities   Other Therapeutic Activities worked on movements with resistance for return to flat water kyacking.   using pole and blue band adnd also balance on tilt board x 30-50 reps.  Also push up position at counter to mimic pressure inc/dcr to RT shoulder to prep for return to riding bike     Shoulder Exercises: Supine   Protraction Right;10 reps   Protraction Weight (lbs) 6   Protraction Limitations 2 sets with bench press then 8 pounds x10 protraction without bench press   Horizontal ABduction Right;10 reps   Horizontal ABduction Weight (lbs) 3   Horizontal ABduction Limitations 3x10 reps     Shoulder Exercises: Prone   Extension Right;10 reps  3 pounds 3 sets   Horizontal ABduction 1 Right;10 reps   Horizontal ABduction 1 Limitations 3 sets  3 pounds      Shoulder Exercises: Sidelying   External Rotation Right;10 reps   External Rotation Weight (lbs) 3   External Rotation Limitations 3 sets     Shoulder Exercises: Standing   External Rotation Right;15 reps   Theraband Level (Shoulder External Rotation) Level 3 (Green)   Internal Rotation 15 reps;Right   Theraband Level (Shoulder Internal Rotation) Level 3 (Green)   Flexion Right;15 reps   Theraband Level (Shoulder Flexion) Level 3 (Green)   Row Right;15 reps  Theraband Level (Shoulder Row) Level 3 (Green)     Shoulder Exercises: ROM/Strengthening   Rebounder 1.5 3 min for 3 back     Modalities   Modalities Cryotherapy     Cryotherapy   Number Minutes Cryotherapy 12 Minutes   Cryotherapy Location Shoulder  R   Type of Cryotherapy Ice pack                PT Education - 03/13/17 1712    Education provided Yes   Education Details HEP counter pushups and bike at gym wiht leaning on to handle bars to build tolerance to return to rideing bike out side.    Person(s) Educated Patient   Methods Explanation;Demonstration;Verbal cues;Handout   Comprehension Returned demonstration;Verbalized  understanding          PT Short Term Goals - 03/11/17 1620      PT SHORT TERM GOAL #1   Title She will be independent with inital HEP   Status Achieved           PT Long Term Goals - 02/26/17 1721      PT LONG TERM GOAL #1   Title Pt will be able to wash hair without pain   Baseline up to 3/10   Time 4   Period Weeks   Status New     PT LONG TERM GOAL #2   Title Pt will be able to cut food up without pain   Baseline 3/10 pain   Time 4   Period Weeks   Status New     PT LONG TERM GOAL #3   Title pt will be I with advanced HEP for continued strengthening to assist with iADLs   Baseline scap retract issued at eval   Time 4   Period Weeks   Status New     PT LONG TERM GOAL #4   Title Pt will be able to perform R shoulder IR in standing/sitting to reach T8 to assist with dressing   Baseline T12   Time 4   Period Weeks   Status New               Plan - 03/13/17 1629    Clinical Impression Statement MD order in chart and  no restrictions on the order.  will contniue to tolerance PRE.  No real pain post most sense ot tightness with very mild pain so iced before leaving. She felt progress good.    PT Treatment/Interventions Cryotherapy;Functional mobility training;Patient/family education;Therapeutic exercise;Therapeutic activities;Manual techniques;Taping;Ultrasound;Electrical Stimulation   PT Next Visit Plan RCR Protocol  She is now > 8 weeks so no resitrictions per protocol Progress strength as able without pain.    PT Home Exercise Plan scap retract, Red band supine horizontal abduction, [place] (except flexion) red band  , counter push up position  with weight shifting and short push ups no pain.    Consulted and Agree with Plan of Care Patient      Patient will benefit from skilled therapeutic intervention in order to improve the following deficits and impairments:  Decreased activity tolerance, Decreased strength, Impaired UE functional use,  Pain  Visit Diagnosis: Muscle weakness (generalized)  Chronic right shoulder pain  Status post decompression of subacromial space  S/P right rotator cuff repair  Right shoulder pain, unspecified chronicity     Problem List Patient Active Problem List   Diagnosis Date Noted  . Elevated LFTs 04/21/2015  . Acne vulgaris 01/29/2013  . Anemia 01/29/2013  . MIGRAINE, COMMON  10/15/2007  . PREMATURE VENTRICULAR CONTRACTIONS 10/15/2007  . GERD 10/15/2007    [name]  PT 03/13/2017, 5:14 PM  [facility] [address] [place], [place], [address] Phone: [phone]   Fax:  [phone]  Name: Taylor Cain MRN: [id number] Date of Birth: 07/05/1961

## 2017-03-18 ENCOUNTER — Ambulatory Visit: Payer: 59

## 2017-03-18 DIAGNOSIS — M6281 Muscle weakness (generalized): Secondary | ICD-10-CM

## 2017-03-18 DIAGNOSIS — G8929 Other chronic pain: Secondary | ICD-10-CM

## 2017-03-18 DIAGNOSIS — M25511 Pain in right shoulder: Secondary | ICD-10-CM

## 2017-03-18 DIAGNOSIS — Z9889 Other specified postprocedural states: Secondary | ICD-10-CM | POA: Diagnosis not present

## 2017-03-18 NOTE — Therapy (Signed)
[place], [place], [address] Phone: [phone]   Fax:  [phone]  Physical Therapy Treatment  Patient Details  Name: Taylor Cain MRN: [id number] Date of Birth: December 12, 1960 Referring Provider: Onnie Graham  Encounter Date: 03/18/2017      PT End of Session - 03/18/17 1637    Visit Number 5   Number of Visits 16   Date for PT Re-Evaluation 04/25/17   PT Start Time 0430   PT Stop Time 0510   PT Time Calculation (min) 40 min   Activity Tolerance Patient tolerated treatment well   Behavior During Therapy [facility] for tasks assessed/performed      Past Medical History:  Diagnosis Date  . Anemia   . Arthritis    right shoulder  . Heart palpitations    made aware when patient was in 20's-30's; states they are very infrequent and pt. remains asymptomatic  . History of pneumonia   . Seasonal allergies     Past Surgical History:  Procedure Laterality Date  . COLONOSCOPY    . KNEE ARTHROSCOPY Right 1990, 1992   torn acl  . SHOULDER ARTHROSCOPY WITH ROTATOR CUFF REPAIR Right 01/02/2017   Procedure: RIGHT SHOULDER ARTHROSCOPY WITH REVISION OF ROTATOR CUFF REPAIR;  Surgeon: Justice Britain, MD;  Location: [place];  Service: Orthopedics;  Laterality: Right;  . SHOULDER ARTHROSCOPY WITH ROTATOR CUFF REPAIR AND SUBACROMIAL DECOMPRESSION Right 07/25/2016   Procedure: RIGHT SHOULDER ARTHROSCOPY WITH ROTATOR CUFF REPAIR AND SUBACROMIAL DECOMPRESSION, DISTAL CLAVICLE RESECTION;  Surgeon: Justice Britain, MD;  Location: [place];  Service: Orthopedics;  Laterality: Right;    There were no vitals filed for this visit.      Subjective Assessment - 03/18/17 1635    Subjective Can sleep without extra pillow ans on RT side now.  no pain today   Currently in Pain? No/denies                         [facility] Adult PT Treatment/Exercise - 03/18/17 0001      Shoulder Exercises: Supine   Protraction Weight (lbs) 8    Protraction Limitations 20 reps with bendh press and plus    Horizontal ABduction Weight (lbs) 4   Horizontal ABduction Limitations   x10 reps     Shoulder Exercises: Prone   Extension --  4 pounds 2x20   Horizontal ABduction 1 Right   Horizontal ABduction 1 Limitations 4 pounds  2x20     Shoulder Exercises: Sidelying   External Rotation Right;20 reps   External Rotation Weight (lbs) 4   External Rotation Limitations 2 sets1 set to horizontal then 2 pounds as far as comfortable x 15     Shoulder Exercises: Standing   Flexion Both;15 reps   Shoulder Flexion Weight (lbs) 3   Flexion Limitations 2 x 10   ABduction Both;15 reps   Shoulder ABduction Weight (lbs) 3   ABduction Limitations 2x10     Shoulder Exercises: ROM/Strengthening   Rebounder --   UBE (Upper Arm Bike) 2 level 3 mn forward , 3 min back     Shoulder Exercises: Stretch   Internal Rotation Stretch 20 seconds   Internal Rotation Stretch Limitations Lying on RT side shoulder 90 degrees and to go easy and to tolerance.    External Rotation Stretch 1 rep;30 seconds  supine with 2 pounds 90/90      Shoulder Exercises: Body Blade   ABduction --   External  Rotation --                  PT Short Term Goals - 03/18/17 1722      PT SHORT TERM GOAL #1   Title She will be independent with inital HEP   Status Achieved     PT SHORT TERM GOAL #2   Title Pt will be able to hold cup of coffee without R shoulder pain   Status Achieved     PT SHORT TERM GOAL #3   Title Pt will demo improved R shoulder strength to 4-/5 throughout to assist with improved functional mobility   Status Unable to assess     PT SHORT TERM GOAL #4   Status Unable to assess     PT SHORT TERM GOAL #5   Title She will be able to plank on floor with no pain full weight on RT arm.    Status Unable to assess           PT Long Term Goals - 02/26/17 1721      PT LONG TERM GOAL #1   Title Pt will be able to wash hair without pain    Baseline up to 3/10   Time 4   Period Weeks   Status New     PT LONG TERM GOAL #2   Title Pt will be able to cut food up without pain   Baseline 3/10 pain   Time 4   Period Weeks   Status New     PT LONG TERM GOAL #3   Title pt will be I with advanced HEP for continued strengthening to assist with iADLs   Baseline scap retract issued at eval   Time 4   Period Weeks   Status New     PT LONG TERM GOAL #4   Title Pt will be able to perform R shoulder IR in standing/sitting to reach T8 to assist with dressing   Baseline T12   Time 4   Period Weeks   Status New               Plan - 03/18/17 1720    Clinical Impression Statement Declined cold pack today reporting no real pain with incr reps and weight.  ill progress as tolerated no pain.    PT Treatment/Interventions Cryotherapy;Functional mobility training;Patient/family education;Therapeutic exercise;Therapeutic activities;Manual techniques;Taping;Ultrasound;Electrical Stimulation   PT Next Visit Plan RCR Protocol  She is now > 8 weeks so no resitrictions per protocol Progress strength as able without pain. Add prone exer for home.  MMT and ROM measure   PT Home Exercise Plan scap retract, Red band supine horizontal abduction, [place] (except flexion) red band  , counter push up position  with weight shifting and short push ups no pain.    Consulted and Agree with Plan of Care Patient      Patient will benefit from skilled therapeutic intervention in order to improve the following deficits and impairments:  Decreased activity tolerance, Decreased strength, Impaired UE functional use, Pain  Visit Diagnosis: Muscle weakness (generalized)  Chronic right shoulder pain  Status post decompression of subacromial space  S/P right rotator cuff repair  Right shoulder pain, unspecified chronicity     Problem List Patient Active Problem List   Diagnosis Date Noted  . Elevated LFTs 04/21/2015  . Acne vulgaris  01/29/2013  . Anemia 01/29/2013  . MIGRAINE, COMMON 10/15/2007  . PREMATURE VENTRICULAR CONTRACTIONS 10/15/2007  . GERD 10/15/2007  [name]  PT 03/18/2017, 5:27 PM  [facility] [address] [place], [place], [address] Phone: [phone]   Fax:  [phone]  Name: KHALIS HITTLE MRN: [id number] Date of Birth: Feb 05, 1961

## 2017-03-20 ENCOUNTER — Ambulatory Visit: Payer: 59

## 2017-03-24 MED FILL — TRIAMCINOLONE 0.1% PASTE: 0.1 | 7 days supply | Qty: 5 | Fill #0

## 2017-03-25 ENCOUNTER — Ambulatory Visit: Payer: 59

## 2017-03-25 DIAGNOSIS — M6281 Muscle weakness (generalized): Secondary | ICD-10-CM

## 2017-03-25 DIAGNOSIS — G8929 Other chronic pain: Secondary | ICD-10-CM

## 2017-03-25 DIAGNOSIS — M25511 Pain in right shoulder: Secondary | ICD-10-CM | POA: Diagnosis not present

## 2017-03-25 DIAGNOSIS — Z9889 Other specified postprocedural states: Secondary | ICD-10-CM

## 2017-03-25 NOTE — Therapy (Signed)
[place], [place], [address] Phone: [phone]   Fax:  [phone]  Physical Therapy Treatment  Patient Details  Name: Taylor Cain MRN: [id number] Date of Birth: 09-Oct-1960 Referring Provider: Onnie Graham  Encounter Date: 03/25/2017      PT End of Session - 03/25/17 1724    Visit Number 6   Number of Visits 16   Date for PT Re-Evaluation 04/25/17   PT Start Time 0430   PT Stop Time 0510   PT Time Calculation (min) 40 min   Activity Tolerance Patient tolerated treatment well;No increased pain   Behavior During Therapy WFL for tasks assessed/performed      Past Medical History:  Diagnosis Date  . Anemia   . Arthritis    right shoulder  . Heart palpitations    made aware when patient was in 20's-30's; states they are very infrequent and pt. remains asymptomatic  . History of pneumonia   . Seasonal allergies     Past Surgical History:  Procedure Laterality Date  . COLONOSCOPY    . KNEE ARTHROSCOPY Right 1990, 1992   torn acl  . SHOULDER ARTHROSCOPY WITH ROTATOR CUFF REPAIR Right 01/02/2017   Procedure: RIGHT SHOULDER ARTHROSCOPY WITH REVISION OF ROTATOR CUFF REPAIR;  Surgeon: Justice Britain, MD;  Location: [place];  Service: Orthopedics;  Laterality: Right;  . SHOULDER ARTHROSCOPY WITH ROTATOR CUFF REPAIR AND SUBACROMIAL DECOMPRESSION Right 07/25/2016   Procedure: RIGHT SHOULDER ARTHROSCOPY WITH ROTATOR CUFF REPAIR AND SUBACROMIAL DECOMPRESSION, DISTAL CLAVICLE RESECTION;  Surgeon: Justice Britain, MD;  Location: [place];  Service: Orthopedics;  Laterality: Right;    There were no vitals filed for this visit.      Subjective Assessment - 03/25/17 1629    Subjective Pain with stretching and with heavier use but generally no pain.    Currently in Pain? No/denies                         [facility] Adult PT Treatment/Exercise - 03/25/17 0001      Shoulder Exercises: Prone   Extension Right   5 pounds 2x 20   Horizontal ABduction 1 Right   Horizontal ABduction 1 Limitations 5  pounds 2x15 then 3 ppounds x 12     Shoulder Exercises: Sidelying   External Rotation Right;15 reps   External Rotation Weight (lbs) 4   External Rotation Limitations 2 sets1 set to horizontal then 3 pounds as far as comfortable x 15     Shoulder Exercises: Standing   Flexion Both;15 reps   Shoulder Flexion Weight (lbs) 4   Flexion Limitations 2 x 10   ABduction Both;15 reps   Shoulder ABduction Weight (lbs) 4   ABduction Limitations 2x10   Row Right;15 reps   Row Weight (lbs) 15    Other Standing Exercises grab bar at pulley with posterior body lean adn pull body with RT arm to erect position   Other Standing Exercises pull downs 25 pounds on bar then black t band x 12 reps ROCK[place]     Shoulder Exercises: ROM/Strengthening   UBE (Upper Arm Bike) 2 level 3 mn forward , 3 min back     Shoulder Exercises: Body Blade   ABduction 30 seconds;2 reps   External Rotation 30 seconds;2 reps                  PT Short Term Goals - 03/18/17 1722      PT  SHORT TERM GOAL #1   Title She will be independent with inital HEP   Status Achieved     PT SHORT TERM GOAL #2   Title Pt will be able to hold cup of coffee without R shoulder pain   Status Achieved     PT SHORT TERM GOAL #3   Title Pt will demo improved R shoulder strength to 4-/5 throughout to assist with improved functional mobility   Status Unable to assess     PT SHORT TERM GOAL #4   Status Unable to assess     PT SHORT TERM GOAL #5   Title She will be able to plank on floor with no pain full weight on RT arm.    Status Unable to assess           PT Long Term Goals - 02/26/17 1721      PT LONG TERM GOAL #1   Title Pt will be able to wash hair without pain   Baseline up to 3/10   Time 4   Period Weeks   Status New     PT LONG TERM GOAL #2   Title Pt will be able to cut food up without pain   Baseline 3/10 pain    Time 4   Period Weeks   Status New     PT LONG TERM GOAL #3   Title pt will be I with advanced HEP for continued strengthening to assist with iADLs   Baseline scap retract issued at eval   Time 4   Period Weeks   Status New     PT LONG TERM GOAL #4   Title Pt will be able to perform R shoulder IR in standing/sitting to reach T8 to assist with dressing   Baseline T12   Time 4   Period Weeks   Status New               Plan - 03/25/17 1724    Clinical Impression Statement No pain post just fatigue . Tolerated increased load .  Will advance as tolerated next visit.    PT Treatment/Interventions Cryotherapy;Functional mobility training;Patient/family education;Therapeutic exercise;Therapeutic activities;Manual techniques;Taping;Ultrasound;Electrical Stimulation   PT Next Visit Plan RCR Protocol  She is now > 8 weeks so no resitrictions per protocol Progress strength as able without pain. Add prone exer for home.  MMT and ROM measure  Goal assessment   PT Home Exercise Plan scap retract, Red band supine horizontal abduction, [place] (except flexion) red band  , counter push up position  with weight shifting and short push ups no pain.    Consulted and Agree with Plan of Care Patient      Patient will benefit from skilled therapeutic intervention in order to improve the following deficits and impairments:  Decreased activity tolerance, Decreased strength, Impaired UE functional use, Pain  Visit Diagnosis: Muscle weakness (generalized)  Chronic right shoulder pain  Status post decompression of subacromial space  S/P right rotator cuff repair  Right shoulder pain, unspecified chronicity     Problem List Patient Active Problem List   Diagnosis Date Noted  . Elevated LFTs 04/21/2015  . Acne vulgaris 01/29/2013  . Anemia 01/29/2013  . MIGRAINE, COMMON 10/15/2007  . PREMATURE VENTRICULAR CONTRACTIONS 10/15/2007  . GERD 10/15/2007    [name]  PT 03/25/2017,  5:30 PM  [facility] [address] [place], [place], [address] Phone: [phone]   Fax:  [phone]  Name: Taylor  NOELLA Cain MRN: [id number] Date of Birth: 01-08-1961

## 2017-03-27 ENCOUNTER — Ambulatory Visit: Payer: 59

## 2017-03-27 DIAGNOSIS — M6281 Muscle weakness (generalized): Secondary | ICD-10-CM | POA: Diagnosis not present

## 2017-03-27 DIAGNOSIS — M25511 Pain in right shoulder: Secondary | ICD-10-CM | POA: Diagnosis not present

## 2017-03-27 DIAGNOSIS — Z9889 Other specified postprocedural states: Secondary | ICD-10-CM

## 2017-03-27 DIAGNOSIS — G8929 Other chronic pain: Secondary | ICD-10-CM | POA: Diagnosis not present

## 2017-03-27 NOTE — Therapy (Signed)
[place], [place], [address] Phone: [phone]   Fax:  [phone]  Physical Therapy Treatment  Patient Details  Name: Taylor Cain MRN: [id number] Date of Birth: 1961/04/12 Referring Provider: Onnie Graham  Encounter Date: 03/27/2017      PT End of Session - 03/27/17 1637    Visit Number 7   Number of Visits 16   Date for PT Re-Evaluation 04/25/17   PT Start Time 0436   PT Stop Time 0515   PT Time Calculation (min) 39 min   Activity Tolerance Patient tolerated treatment well   Behavior During Therapy [facility] for tasks assessed/performed      Past Medical History:  Diagnosis Date  . Anemia   . Arthritis    right shoulder  . Heart palpitations    made aware when patient was in 20's-30's; states they are very infrequent and pt. remains asymptomatic  . History of pneumonia   . Seasonal allergies     Past Surgical History:  Procedure Laterality Date  . COLONOSCOPY    . KNEE ARTHROSCOPY Right 1990, 1992   torn acl  . SHOULDER ARTHROSCOPY WITH ROTATOR CUFF REPAIR Right 01/02/2017   Procedure: RIGHT SHOULDER ARTHROSCOPY WITH REVISION OF ROTATOR CUFF REPAIR;  Surgeon: Justice Britain, MD;  Location: [place];  Service: Orthopedics;  Laterality: Right;  . SHOULDER ARTHROSCOPY WITH ROTATOR CUFF REPAIR AND SUBACROMIAL DECOMPRESSION Right 07/25/2016   Procedure: RIGHT SHOULDER ARTHROSCOPY WITH ROTATOR CUFF REPAIR AND SUBACROMIAL DECOMPRESSION, DISTAL CLAVICLE RESECTION;  Surgeon: Justice Britain, MD;  Location: [place];  Service: Orthopedics;  Laterality: Right;    There were no vitals filed for this visit.      Subjective Assessment - 03/27/17 1637    Subjective Yesterday afternoon started to be sore RT trap    Currently in Pain? Yes   Pain Score 3    Pain Location Shoulder   Pain Orientation Right  trap   Pain Descriptors / Indicators Spasm   Pain Type Chronic pain   Pain Onset More than a month ago   Pain  Frequency Intermittent   Aggravating Factors  using arm    Pain Relieving Factors rest , cold , meds   Multiple Pain Sites No                         [facility] Adult PT Treatment/Exercise - 03/27/17 0001      Shoulder Exercises: Supine   Protraction Right;20 reps   Protraction Weight (lbs) 10   Protraction Limitations 20 reps with bendh press and plus    Horizontal ABduction 20 reps;Both   Horizontal ABduction Weight (lbs) 5   Horizontal ABduction Limitations 3x10     Shoulder Exercises: Standing   Other Standing Exercises  25 pound pull downs x 20 at omega machine , bicep curls 8 pounds x 25 reps.      Shoulder Exercises: ROM/Strengthening   UBE (Upper Arm Bike) r and back 3 min each   Other ROM/Strengthening Exercises freemotion diagonal pull down 2 plates x 20 and pull down into extension  x 10 17 pounds x 10 13 pounds     Shoulder Exercises: Body Blade   Internal Rotation Limitations  2 sets 90 sec with movement fro and into flexion and abduction below 90 degrees                  PT Short Term Goals - 03/27/17 1724  PT SHORT TERM GOAL #1   Title She will be independent with inital HEP   Status Achieved     PT SHORT TERM GOAL #2   Title Pt will be able to hold cup of coffee without R shoulder pain   Status Achieved     PT SHORT TERM GOAL #3   Title Pt will demo improved R shoulder strength to 4-/5 throughout to assist with improved functional mobility   Baseline RT shoulder 4/5 or better   Status Achieved     PT SHORT TERM GOAL #4   Title She will improve strength to 5-/5 min RT shoulder strength.    Status On-going     PT SHORT TERM GOAL #5   Title She will be able to plank on floor with no pain full weight on RT arm.    Status Unable to assess           PT Long Term Goals - 02/26/17 1721      PT LONG TERM GOAL #1   Title Pt will be able to wash hair without pain   Baseline up to 3/10   Time 4   Period Weeks   Status New      PT LONG TERM GOAL #2   Title Pt will be able to cut food up without pain   Baseline 3/10 pain   Time 4   Period Weeks   Status New     PT LONG TERM GOAL #3   Title pt will be I with advanced HEP for continued strengthening to assist with iADLs   Baseline scap retract issued at eval   Time 4   Period Weeks   Status New     PT LONG TERM GOAL #4   Title Pt will be able to perform R shoulder IR in standing/sitting to reach T8 to assist with dressing   Baseline T12   Time 4   Period Weeks   Status New               Plan - 03/27/17 1721    Clinical Impression Statement Sorenes in trap no worse and she reports more tightness.than pain.    I avoided exercise that would directly involve traps and retraction today so as not to over do that muscle(s) as  last session did incr weight  with rows and hor abduciton.    PT Treatment/Interventions Cryotherapy;Functional mobility training;Patient/family education;Therapeutic exercise;Therapeutic activities;Manual techniques;Taping;Ultrasound;Electrical Stimulation   PT Next Visit Plan Continue strengthening  to tolerance without incr pain.   More body weight exercise if no pain.    PT Home Exercise Plan scap retract, Red band supine horizontal abduction, [place] (except flexion) red band  , counter push up position  with weight shifting and short push ups no pain.    Consulted and Agree with Plan of Care Patient      Patient will benefit from skilled therapeutic intervention in order to improve the following deficits and impairments:  Decreased activity tolerance, Decreased strength, Impaired UE functional use, Pain  Visit Diagnosis: Muscle weakness (generalized)  Chronic right shoulder pain  Status post decompression of subacromial space  S/P right rotator cuff repair  Right shoulder pain, unspecified chronicity     Problem List Patient Active Problem List   Diagnosis Date Noted  . Elevated LFTs 04/21/2015  . Acne  vulgaris 01/29/2013  . Anemia 01/29/2013  . MIGRAINE, COMMON 10/15/2007  . PREMATURE VENTRICULAR CONTRACTIONS 10/15/2007  . GERD 10/15/2007  [name]  PT 03/27/2017, 5:28 PM  [facility] [address] [place], [place], [address] Phone: [phone]   Fax:  [phone]  Name: Taylor Cain MRN: [id number] Date of Birth: 1960/10/21

## 2017-04-08 ENCOUNTER — Ambulatory Visit: Payer: 59 | Attending: Orthopedic Surgery

## 2017-04-08 DIAGNOSIS — G8929 Other chronic pain: Secondary | ICD-10-CM | POA: Diagnosis not present

## 2017-04-08 DIAGNOSIS — Z9889 Other specified postprocedural states: Secondary | ICD-10-CM | POA: Diagnosis not present

## 2017-04-08 DIAGNOSIS — M25511 Pain in right shoulder: Secondary | ICD-10-CM | POA: Diagnosis not present

## 2017-04-08 DIAGNOSIS — M6281 Muscle weakness (generalized): Secondary | ICD-10-CM | POA: Insufficient documentation

## 2017-04-08 NOTE — Therapy (Signed)
[place], [place], [address] Phone: [phone]   Fax:  [phone]  Physical Therapy Treatment  Patient Details  Name: Taylor Cain MRN: [id number] Date of Birth: 16-May-1961 Referring Provider: Onnie Graham  Encounter Date: 04/08/2017      PT End of Session - 04/08/17 1547    Visit Number 8   Number of Visits 16   Date for PT Re-Evaluation 04/25/17   PT Start Time 0346   PT Stop Time 0430   PT Time Calculation (min) 44 min   Activity Tolerance Patient tolerated treatment well   Behavior During Therapy [facility] for tasks assessed/performed      Past Medical History:  Diagnosis Date  . Anemia   . Arthritis    right shoulder  . Heart palpitations    made aware when patient was in 20's-30's; states they are very infrequent and pt. remains asymptomatic  . History of pneumonia   . Seasonal allergies     Past Surgical History:  Procedure Laterality Date  . COLONOSCOPY    . KNEE ARTHROSCOPY Right 1990, 1992   torn acl  . SHOULDER ARTHROSCOPY WITH ROTATOR CUFF REPAIR Right 01/02/2017   Procedure: RIGHT SHOULDER ARTHROSCOPY WITH REVISION OF ROTATOR CUFF REPAIR;  Surgeon: Justice Britain, MD;  Location: [place];  Service: Orthopedics;  Laterality: Right;  . SHOULDER ARTHROSCOPY WITH ROTATOR CUFF REPAIR AND SUBACROMIAL DECOMPRESSION Right 07/25/2016   Procedure: RIGHT SHOULDER ARTHROSCOPY WITH ROTATOR CUFF REPAIR AND SUBACROMIAL DECOMPRESSION, DISTAL CLAVICLE RESECTION;  Surgeon: Justice Britain, MD;  Location: [place];  Service: Orthopedics;  Laterality: Right;    There were no vitals filed for this visit.      Subjective Assessment - 04/08/17 1549    Subjective --   Currently in Pain? No/denies                         [facility] Adult PT Treatment/Exercise - 04/08/17 0001      Shoulder Exercises: Prone   Extension Right  5 pounds x 30   Horizontal ABduction 1 Right   Horizontal ABduction 1  Limitations 5 pounds  3x10   Other Prone Exercises Quadraped bilateral knee lift x 15 5 sec hold  then foot lift RT/ LT x8 each then in pushup position with arm lifts  x 8 rT/ LT      Shoulder Exercises: Sidelying   External Rotation Right;15 reps   External Rotation Weight (lbs) 5   External Rotation Limitations 3 sets  10 reps     Shoulder Exercises: Standing   Flexion --   Shoulder Flexion Weight (lbs) --   ABduction --   Shoulder ABduction Weight (lbs) --   Other Standing Exercises 25 pounds stand row from floor  20  reps   Other Standing Exercises red band jab and upper cut motions with moderate speed withoiut pain x 25 reps      Shoulder Exercises: ROM/Strengthening   UBE (Upper Arm Bike) for and back L3 3 min each                PT Education - 04/08/17 1638    Education provided Yes   Education Details Discussed need to get full ROm and to be patient with strengthening especially abduction   Person(s) Educated Patient   Methods Explanation   Comprehension Verbalized understanding          PT Short Term Goals - 03/27/17 1724  PT SHORT TERM GOAL #1   Title She will be independent with inital HEP   Status Achieved     PT SHORT TERM GOAL #2   Title Pt will be able to hold cup of coffee without R shoulder pain   Status Achieved     PT SHORT TERM GOAL #3   Title Pt will demo improved R shoulder strength to 4-/5 throughout to assist with improved functional mobility   Baseline RT shoulder 4/5 or better   Status Achieved     PT SHORT TERM GOAL #4   Title She will improve strength to 5-/5 min RT shoulder strength.    Status On-going     PT SHORT TERM GOAL #5   Title She will be able to plank on floor with no pain full weight on RT arm.    Status Unable to assess           PT Long Term Goals - 04/08/17 1641      PT LONG TERM GOAL #1   Title Pt will be able to wash hair without pain   Status Achieved     PT LONG TERM GOAL #2   Title Pt will  be able to cut food up without pain   Status Achieved     PT LONG TERM GOAL #3   Title pt will be I with advanced HEP for continued strengthening to assist with iADLs   Time 8   Period Weeks   Status On-going     PT LONG TERM GOAL #4   Title Pt will be able to perform R shoulder IR in standing/sitting to reach T8 to assist with dressing   Time 8   Period Weeks   Status Unable to assess     PT LONG TERM GOAL #5   Title Return to body loaded exercises with no pain    Time 4   Period Weeks   Status New               Plan - 04/08/17 1551    Clinical Impression Statement Doing well no pain today. lifting heavier weight and tolerating loaded positions such as plankin with double and single arm support. Will have her do this for another 2 weeks then add rotation movments loaded and part pushups.    PT Treatment/Interventions Cryotherapy;Functional mobility training;Patient/family education;Therapeutic exercise;Therapeutic activities;Manual techniques;Taping;Ultrasound;Electrical Stimulation   PT Next Visit Plan Continue strengthening  to tolerance without incr pain.   More body weight exercise if no pain.  start loaded rotation and part pushups in 2 weeks if no pain.    PT Home Exercise Plan scap retract, Red band supine horizontal abduction, [place] (except flexion) red band  , counter push up position  with weight shifting and short push ups no pain.    Consulted and Agree with Plan of Care Patient      Patient will benefit from skilled therapeutic intervention in order to improve the following deficits and impairments:  Decreased activity tolerance, Decreased strength, Impaired UE functional use, Pain  Visit Diagnosis: Muscle weakness (generalized)  Chronic right shoulder pain  Status post decompression of subacromial space  S/P right rotator cuff repair     Problem List Patient Active Problem List   Diagnosis Date Noted  . Elevated LFTs 04/21/2015  . Acne  vulgaris 01/29/2013  . Anemia 01/29/2013  . MIGRAINE, COMMON 10/15/2007  . PREMATURE VENTRICULAR CONTRACTIONS 10/15/2007  . GERD 10/15/2007    Taylor Cain  PT 04/08/2017, 4:44 PM  [facility] [place], [place], [address] Phone: [phone]   Fax:  [phone]  Name: Taylor Cain MRN: [id number] Date of Birth: 09/09/1961

## 2017-04-16 MED FILL — diazePAM 5 MG TABS: 5 | 30 days supply | Qty: 30 | Fill #0

## 2017-04-17 ENCOUNTER — Ambulatory Visit: Payer: 59

## 2017-04-17 DIAGNOSIS — Z9889 Other specified postprocedural states: Secondary | ICD-10-CM

## 2017-04-17 DIAGNOSIS — M6281 Muscle weakness (generalized): Secondary | ICD-10-CM | POA: Diagnosis not present

## 2017-04-17 DIAGNOSIS — M25511 Pain in right shoulder: Secondary | ICD-10-CM

## 2017-04-17 DIAGNOSIS — G8929 Other chronic pain: Secondary | ICD-10-CM | POA: Diagnosis not present

## 2017-04-17 NOTE — Therapy (Addendum)
[place], [place], [address] Phone: [phone]   Fax:  [phone]  Physical Therapy Treatment/Discharge  Patient Details  Name: Taylor Cain MRN: [id number] Date of Birth: [birth year] Referring Provider: Onnie Graham  Encounter Date: 04/17/2017      PT End of Session - 04/17/17 1727    Visit Number 9   Number of Visits 21   Date for PT Re-Evaluation 05/30/17   PT Start Time 0430   PT Stop Time 0515   PT Time Calculation (min) 45 min   Activity Tolerance Patient tolerated treatment well;No increased pain   Behavior During Therapy [facility] for tasks assessed/performed      Past Medical History:  Diagnosis Date  . Anemia   . Arthritis    right shoulder  . Heart palpitations    made aware when patient was in 20's-30's; states they are very infrequent and pt. remains asymptomatic  . History of pneumonia   . Seasonal allergies     Past Surgical History:  Procedure Laterality Date  . COLONOSCOPY    . KNEE ARTHROSCOPY Right 1990, 1992   torn acl  . SHOULDER ARTHROSCOPY WITH ROTATOR CUFF REPAIR Right 01/02/2017   Procedure: RIGHT SHOULDER ARTHROSCOPY WITH REVISION OF ROTATOR CUFF REPAIR;  Surgeon: Justice Britain, MD;  Location: [place];  Service: Orthopedics;  Laterality: Right;  . SHOULDER ARTHROSCOPY WITH ROTATOR CUFF REPAIR AND SUBACROMIAL DECOMPRESSION Right 07/25/2016   Procedure: RIGHT SHOULDER ARTHROSCOPY WITH ROTATOR CUFF REPAIR AND SUBACROMIAL DECOMPRESSION, DISTAL CLAVICLE RESECTION;  Surgeon: Justice Britain, MD;  Location: [place];  Service: Orthopedics;  Laterality: Right;    There were no vitals filed for this visit.      Subjective Assessment - 04/17/17 1724    Subjective Dr Onnie Graham wa pleased but I thought he may have been unhappy about strength but did not say anything. He discharged me. .  No pain   Currently in Pain? No/denies                         [facility] Adult PT  Treatment/Exercise - 04/17/17 0001      Shoulder Exercises: Prone   Extension Right   External Rotation Weight (lbs) 5   External Rotation Limitations 3 sets of 10   Horizontal ABduction 1 Right   Horizontal ABduction 1 Limitations 5 pounds  3x10   Other Prone Exercises also isometric holds at end ROM hor abduction x 10 5 sec hold as best as able.  then ER same 10 reps     Shoulder Exercises: Sidelying   External Rotation Right;10 reps   External Rotation Weight (lbs) 4   External Rotation Limitations 3 sets  10 reps     Shoulder Exercises: Standing   Flexion Both;10 reps   Shoulder Flexion Weight (lbs) 4   Flexion Limitations 10 reps 4 pounds ,,  10 reps   with 5 pounds but form deteriorated so stopped at 10    Other Standing Exercises worked on end range hold overhead .  x 12 reps    Other Standing Exercises overhead press 5 pounds x 10 , quality of RT poor compared o ;left but no pain.  then bicep curls x 20 8 pounds.        Shoulder Exercises: ROM/Strengthening   UBE (Upper Arm Bike) for and back L3 3 min each   Other ROM/Strengthening Exercises freemotion diagonal pull down 3 plates x 20 and pull down  into extension  x 20   then  pull ups across body over LT shoulder x 20 1 plate                  PT Short Term Goals - 04/17/17 1734      PT SHORT TERM GOAL #1   Title She will be independent with inital HEP   Status Achieved     PT SHORT TERM GOAL #2   Title Pt will be able to hold cup of coffee without R shoulder pain   Status Achieved     PT SHORT TERM GOAL #3   Title Pt will demo improved R shoulder strength to 4-/5 throughout to assist with improved functional mobility   Status Achieved     PT SHORT TERM GOAL #4   Title She will improve strength to 5-/5 min RT shoulder strength.    Baseline 4+/5     PT SHORT TERM GOAL #5   Title She will be able to plank on floor with no pain full weight on RT arm.    Status Unable to assess           PT Long  Term Goals - 04/08/17 1641      PT LONG TERM GOAL #1   Title Pt will be able to wash hair without pain   Status Achieved     PT LONG TERM GOAL #2   Title Pt will be able to cut food up without pain   Status Achieved     PT LONG TERM GOAL #3   Title pt will be I with advanced HEP for continued strengthening to assist with iADLs   Time 8   Period Weeks   Status On-going     PT LONG TERM GOAL #4   Title Pt will be able to perform R shoulder IR in standing/sitting to reach T8 to assist with dressing   Time 8   Period Weeks   Status Unable to assess     PT LONG TERM GOAL #5   Title Return to body loaded exercises with no pain    Time 4   Period Weeks   Status New               Plan - 04/17/17 1729    Clinical Impression Statement No pain today.  Appeared weaker today than last visit.  She appears with end range weakness so may need to do more end range isometrics to help strength.     PT Treatment/Interventions Cryotherapy;Functional mobility training;Patient/family education;Therapeutic exercise;Therapeutic activities;Manual techniques;Taping;Ultrasound;Electrical Stimulation   PT Next Visit Plan Continue strengthening  to tolerance without incr pain.   More body weight exercise if no pain.  start loaded rotation and part pushups in 2 weeks if no pain.  End range  isometrics   PT Home Exercise Plan scap retract, Red band supine horizontal abduction, [place] (except flexion) red band  , counter push up position  with weight shifting and short push ups no pain.    Consulted and Agree with Plan of Care Patient      Patient will benefit from skilled therapeutic intervention in order to improve the following deficits and impairments:  Decreased activity tolerance, Decreased strength, Impaired UE functional use, Pain  Visit Diagnosis: Muscle weakness (generalized)  Chronic right shoulder pain  Status post decompression of subacromial space  Right shoulder pain,  unspecified chronicity  S/P right rotator cuff repair     Problem List Patient Active  Problem List   Diagnosis Date Noted  . Elevated LFTs 04/21/2015  . Acne vulgaris 01/29/2013  . Anemia 01/29/2013  . MIGRAINE, COMMON 10/15/2007  . PREMATURE VENTRICULAR CONTRACTIONS 10/15/2007  . GERD 10/15/2007    [name]  PT 04/17/2017, 5:35 PM  [facility] [address] [place], [place], [address] Phone: [phone]   Fax:  [phone]  Name: Taylor Cain MRN: [id number] Date of Birth: 10/27/1960  PHYSICAL THERAPY DISCHARGE SUMMARY  Visits from Start of Care: 9  Current functional level related to goals / functional outcomes: Ms Keltz called to cancel all appointments reporting she is doing well without need for more PT   Remaining deficits: See above   Education / Equipment: HEP Plan: Patient agrees to discharge.  Patient goals were partially met. Patient is being discharged due to being pleased with the current functional level.  ?????    Pearson Forster PT  05/15/17   7:23 AM

## 2017-04-21 ENCOUNTER — Other Ambulatory Visit: Payer: 59

## 2017-04-25 ENCOUNTER — Encounter: Payer: 59 | Admitting: Rehabilitative and Restorative Service Providers"

## 2017-04-29 ENCOUNTER — Encounter: Payer: 59 | Admitting: Family Medicine

## 2017-05-05 ENCOUNTER — Ambulatory Visit: Payer: 59 | Admitting: Physical Therapy

## 2017-05-09 MED FILL — AMOXICILLIN 500 MG CAPSULE: 500 | 30 days supply | Qty: 60 | Fill #6

## 2017-05-19 ENCOUNTER — Other Ambulatory Visit (INDEPENDENT_AMBULATORY_CARE_PROVIDER_SITE_OTHER): Payer: 59

## 2017-05-19 ENCOUNTER — Telehealth: Payer: Self-pay | Admitting: Family Medicine

## 2017-05-19 DIAGNOSIS — D649 Anemia, unspecified: Secondary | ICD-10-CM | POA: Diagnosis not present

## 2017-05-19 DIAGNOSIS — Z13220 Encounter for screening for lipoid disorders: Secondary | ICD-10-CM | POA: Diagnosis not present

## 2017-05-19 LAB — CBC WITH DIFFERENTIAL/PLATELET
BASOS PCT: 0.6 % (ref 0.0–3.0)
Basophils Absolute: 0 10*3/uL (ref 0.0–0.1)
EOS PCT: 2.7 % (ref 0.0–5.0)
Eosinophils Absolute: 0.1 10*3/uL (ref 0.0–0.7)
HCT: 41 % (ref 36.0–46.0)
Hemoglobin: 13.8 g/dL (ref 12.0–15.0)
Lymphocytes Relative: 43.8 % (ref 12.0–46.0)
Lymphs Abs: 1.9 10*3/uL (ref 0.7–4.0)
MCHC: 33.7 g/dL (ref 30.0–36.0)
MCV: 97 fl (ref 78.0–100.0)
MONO ABS: 0.4 10*3/uL (ref 0.1–1.0)
Monocytes Relative: 8.9 % (ref 3.0–12.0)
Neutro Abs: 1.9 10*3/uL (ref 1.4–7.7)
Neutrophils Relative %: 44 % (ref 43.0–77.0)
PLATELETS: 156 10*3/uL (ref 150.0–400.0)
RBC: 4.23 Mil/uL (ref 3.87–5.11)
RDW: 13.4 % (ref 11.5–15.5)
WBC: 4.4 10*3/uL (ref 4.0–10.5)

## 2017-05-19 LAB — LIPID PANEL
CHOLESTEROL: 152 mg/dL (ref 0–200)
HDL: 68.7 mg/dL (ref >39.00)
LDL CALC: 63 mg/dL (ref 0–99)
NonHDL: 83.61
Total CHOL/HDL Ratio: 2
Triglycerides: 101 mg/dL (ref 0.0–149.0)
VLDL: 20.2 mg/dL (ref 0.0–40.0)

## 2017-05-19 LAB — COMPREHENSIVE METABOLIC PANEL
ALBUMIN: 4.2 g/dL (ref 3.5–5.2)
ALT: 19 U/L (ref 0–35)
AST: 25 U/L (ref 0–37)
Alkaline Phosphatase: 57 U/L (ref 39–117)
BUN: 14 mg/dL (ref 6–23)
CHLORIDE: 101 meq/L (ref 96–112)
CO2: 32 mEq/L (ref 19–32)
Calcium: 9.3 mg/dL (ref 8.4–10.5)
Creatinine, Ser: 0.81 mg/dL (ref 0.40–1.20)
GFR: 77.09 mL/min (ref >60.00)
Glucose, Bld: 93 mg/dL (ref 70–99)
POTASSIUM: 4.1 meq/L (ref 3.5–5.1)
SODIUM: 138 meq/L (ref 135–145)
Total Bilirubin: 0.3 mg/dL (ref 0.2–1.2)
Total Protein: 7.4 g/dL (ref 6.0–8.3)

## 2017-05-19 NOTE — Telephone Encounter (Signed)
-----   Message from Marchia Bond sent at 05/08/2017 11:54 AM EDT ----- Regarding: Cpx labs Mon 8/20, need orders. Thanks :-) Please order  future cpx labs for pt's upcoming lab appt. Thanks Aniceto Boss

## 2017-05-23 ENCOUNTER — Encounter: Payer: Self-pay | Admitting: Family Medicine

## 2017-05-23 ENCOUNTER — Ambulatory Visit (INDEPENDENT_AMBULATORY_CARE_PROVIDER_SITE_OTHER): Payer: 59 | Admitting: Family Medicine

## 2017-05-23 ENCOUNTER — Encounter: Payer: 59 | Admitting: Physical Therapy

## 2017-05-23 VITALS — BP 100/64 | HR 59 | Temp 98.4°F | Ht 66.0 in | Wt 135.8 lb

## 2017-05-23 DIAGNOSIS — Z0000 Encounter for general adult medical examination without abnormal findings: Secondary | ICD-10-CM

## 2017-05-23 DIAGNOSIS — S39012A Strain of muscle, fascia and tendon of lower back, initial encounter: Secondary | ICD-10-CM | POA: Diagnosis not present

## 2017-05-23 DIAGNOSIS — D508 Other iron deficiency anemias: Secondary | ICD-10-CM | POA: Diagnosis not present

## 2017-05-23 DIAGNOSIS — G43009 Migraine without aura, not intractable, without status migrainosus: Secondary | ICD-10-CM | POA: Diagnosis not present

## 2017-05-23 NOTE — Assessment & Plan Note (Signed)
Rare occurences , treats with NSAIDs.

## 2017-05-23 NOTE — Patient Instructions (Signed)
Start low back stretching and massage for low back muscle strain.  Keep working on healthy eating and regular exercise.

## 2017-05-23 NOTE — Progress Notes (Signed)
Subjective:    Patient ID: Taylor Cain, female    DOB: Apr 24, 1961, 56 y.o.   MRN: [id number]  HPI  The patient is here for annual wellness exam and preventative care.  She is doing well overall but has noted some soreness in low back.. Nonfocal.  DAily in last 2 months.  Most with stretching.  No fall, no injury.    No weakness, no numbness, no radiation of pain.  no dysuria, no frequency.  Has not been constant so she has not taken any medicaiton to treat.   Earlier in year: right shoulder RCF surgery, decompression.  Has complete rehab.. doing better.  Common migraine:  Rare occurrence. Treats with sudafed and advil.   Iron def anemia: Nml  now on current iron.   Reviewed labs in detail: Exercise: off and on but plans to restart  Diet: healthy low chol diet.  LFTs remain in nml range.  Social History /Family History/Past Medical History reviewed in detail and updated in EMR if needed. Blood pressure 100/64, pulse (!) 59, temperature 98.4 F (36.9 C), temperature source Oral, height 5\' 6"  (1.676 m), weight 135 lb 12 oz (61.6 kg), last menstrual period 01/28/2014. Body mass index is 21.91 kg/m.   Review of Systems  Constitutional: Negative for fatigue and fever.  HENT: Negative for congestion.   Eyes: Negative for pain.  Respiratory: Negative for cough and shortness of breath.   Cardiovascular: Negative for chest pain, palpitations and leg swelling.  Gastrointestinal: Negative for abdominal pain.  Genitourinary: Negative for dysuria and vaginal bleeding.  Musculoskeletal: Negative for back pain.  Neurological: Negative for syncope, light-headedness and headaches.  Psychiatric/Behavioral: Negative for dysphoric mood.       Objective:   Physical Exam  Constitutional: Vital signs are normal. She appears well-developed and well-nourished. She is cooperative.  Non-toxic appearance. She does not appear ill. No distress.  HENT:  Head: Normocephalic.  Right Ear:  Hearing, tympanic membrane, external ear and ear canal normal.  Left Ear: Hearing, tympanic membrane, external ear and ear canal normal.  Nose: Nose normal.  Eyes: Pupils are equal, round, and reactive to light. Conjunctivae, EOM and lids are normal. Lids are everted and swept, no foreign bodies found.  Neck: Trachea normal and normal range of motion. Neck supple. Carotid bruit is not present. No thyroid mass and no thyromegaly present.  Cardiovascular: Normal rate, regular rhythm, S1 normal, S2 normal, normal heart sounds and intact distal pulses.  Exam reveals no gallop.   No murmur heard. Pulmonary/Chest: Effort normal and breath sounds normal. No respiratory distress. She has no wheezes. She has no rhonchi. She has no rales.  Abdominal: Soft. Normal appearance and bowel sounds are normal. She exhibits no distension, no fluid wave, no abdominal bruit and no mass. There is no hepatosplenomegaly. There is no tenderness. There is no rebound, no guarding and no CVA tenderness. No hernia.  Musculoskeletal:       Lumbar back: Normal. She exhibits normal range of motion, no tenderness, no bony tenderness and no swelling.  Lymphadenopathy:    She has no cervical adenopathy.    She has no axillary adenopathy.  Neurological: She is alert. She has normal strength. No cranial nerve deficit or sensory deficit.  Skin: Skin is warm, dry and intact. No rash noted.  Psychiatric: Her speech is normal and behavior is normal. Judgment normal. Her mood appears not anxious. Cognition and memory are normal. She does not exhibit a depressed mood.  Assessment & Plan:  The patient's preventative maintenance and recommended screening tests for an annual wellness exam were reviewed in full today. Brought up to date unless services declined.  Counselled on the importance of diet, exercise, and its role in overall health and mortality. The patient's FH and SH was reviewed, including their home life, tobacco  status, and drug and alcohol status.   Vaccines: Uptodate Tdap, consider shingles vaccine DXA nml at age 55. DXA not indicated because low risk. Start age 18-65 Mammo: nml 03/2016, no family history PAP/DVE:  03/2016  Nml, neg HPV.Marland Kitchen Repeat in 5 years.  Non smoker. Colon:  2015 Colonoscopy nml, Dr Fuller Plan. Recall in 10 years. Hep C  Neg  HIV: refused

## 2017-05-23 NOTE — Assessment & Plan Note (Signed)
Treat with Heat, massage and start home PT.

## 2017-05-23 NOTE — Assessment & Plan Note (Signed)
Stable on iron, pt can wean down.

## 2017-05-29 ENCOUNTER — Encounter: Payer: 59 | Admitting: Physical Therapy

## 2017-07-14 DIAGNOSIS — L718 Other rosacea: Secondary | ICD-10-CM | POA: Diagnosis not present

## 2017-07-14 MED FILL — CLINDAMYCIN PH 1% GEL: 1 | 30 days supply | Qty: 60 | Fill #0

## 2017-07-14 MED FILL — AMPICILLIN TR 500 MG CAP: 500 | 30 days supply | Qty: 60 | Fill #0

## 2017-09-04 MED FILL — AMPICILLIN TR 500 MG CAP: 500 | 30 days supply | Qty: 60 | Fill #1

## 2017-09-04 MED FILL — CLINDAMYCIN PH 1% GEL: 1 | 30 days supply | Qty: 60 | Fill #1

## 2017-10-31 ENCOUNTER — Telehealth: Payer: 59 | Admitting: Family

## 2017-10-31 DIAGNOSIS — H109 Unspecified conjunctivitis: Secondary | ICD-10-CM

## 2017-10-31 MED ORDER — POLYMYXIN B-TRIMETHOPRIM 10000-0.1 UNIT/ML-% OP SOLN: 2.0000 [drp] | Freq: Four times a day (QID) | OPHTHALMIC | 0 refills | Status: DC

## 2017-10-31 MED FILL — AMPICILLIN TR 500 MG CAP: 500 | 30 days supply | Qty: 60 | Fill #2

## 2017-10-31 MED FILL — POLYMYXIN B/TMP EYE DROPS: 10000-0.1 | 25 days supply | Qty: 10 | Fill #0

## 2017-10-31 NOTE — Progress Notes (Signed)
Thank you for the details you included in the comment boxes. Those details are very helpful in determining the best course of treatment for you and help us to provide the best care.  We are sorry that you are not feeling well.  Here is how we plan to help!  Based on what you have shared with me it looks like you have conjunctivitis.  Conjunctivitis is a common inflammatory or infectious condition of the eye that is often referred to as "pink eye".  In most cases it is contagious (viral or bacterial). However, not all conjunctivitis requires antibiotics (ex. Allergic).  We have made appropriate suggestions for you based upon your presentation.  I have prescribed Polytrim Ophthalmic drops 2 drops 4 times a day times 5 days  Pink eye can be highly contagious.  It is typically spread through direct contact with secretions, or contaminated objects or surfaces that one may have touched.  Strict handwashing is suggested with soap and water is urged.  If not available, use alcohol based had sanitizer.  Avoid unnecessary touching of the eye.  If you wear contact lenses, you will need to refrain from wearing them until you see no white discharge from the eye for at least 24 hours after being on medication.  You should see symptom improvement in 1-2 days after starting the medication regimen.  Call us if symptoms are not improved in 1-2 days.  Home Care:  Wash your hands often!  Do not wear your contacts until you complete your treatment plan.  Avoid sharing towels, bed linen, personal items with a person who has pink eye.  See attention for anyone in your home with similar symptoms.  Get Help Right Away If:  Your symptoms do not improve.  You develop blurred or loss of vision.  Your symptoms worsen (increased discharge, pain or redness)  Your e-visit answers were reviewed by a board certified advanced clinical practitioner to complete your personal care plan.  Depending on the condition, your plan  could have included both over the counter or prescription medications.  If there is a problem please reply  once you have received a response from your provider.  Your safety is important to us.  If you have drug allergies check your prescription carefully.    You can use MyChart to ask questions about today's visit, request a non-urgent call back, or ask for a work or school excuse for 24 hours related to this e-Visit. If it has been greater than 24 hours you will need to follow up with your provider, or enter a new e-Visit to address those concerns.   You will get an e-mail in the next two days asking about your experience.  I hope that your e-visit has been valuable and will speed your recovery. Thank you for using e-visits.      

## 2017-11-07 IMAGING — RF DG FLUORO GUIDE NDL PLC/BX
3 series · 3 of 3 positions shown · IV contrast (multihance)
Comparison: none

CLINICAL DATA: Postoperative right shoulder pain and limited range
of motion. History of right rotator cuff repair 07/25/2016.

EXAM:
RIGHT SHOULDER INJECTION UNDER FLUOROSCOPY
TECHNIQUE: An appropriate skin entrance site was determined. The site was
marked, prepped with Betadine, draped in the usual sterile fashion,
and infiltrated locally with buffered Lidocaine. 22 gauge spinal
needle was advanced to the superomedial margin of the humeral head
under intermittent fluoroscopy. 1 ml of Lidocaine injected easily. A
mixture of 0.05 ml Multihance 10 ml of Omnipaque 300 was then used
to opacify the right shoulder capsule. No immediate complication.
FLUOROSCOPY TIME:  Fluoroscopy Time:  0 minutes 18 seconds
Number of Acquired Spot Images: 0

[Series 1: cp_standard · 0.19mm/px · 1 of 1 slices shown (1 of 3)]
[im 1/1]
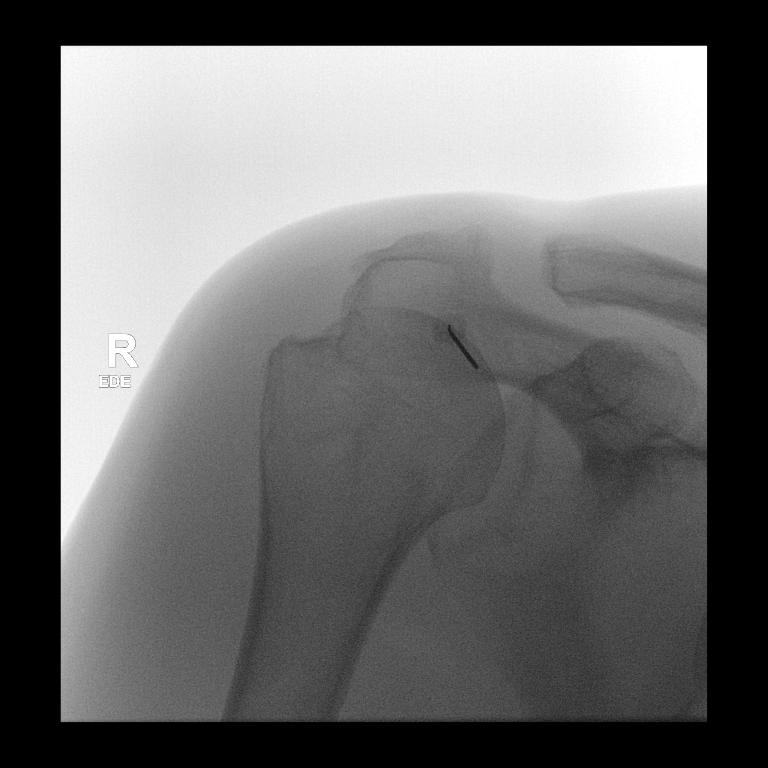

[Series 2: cp_standard · 0.17mm/px · 1 of 1 slices shown (2 of 3)]
[im 1/1]
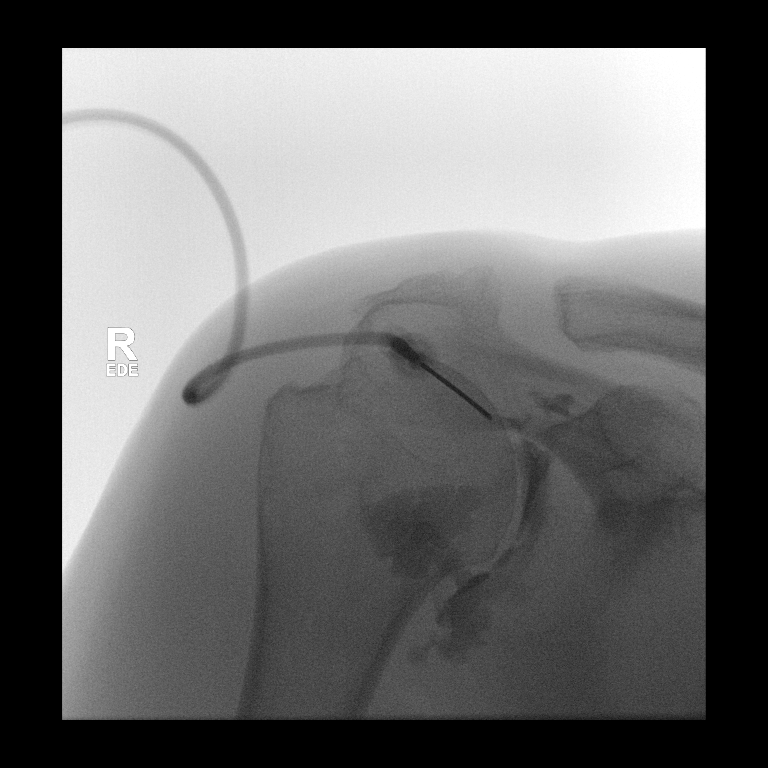

[Series 3: cp_standard · 0.17mm/px · 1 of 1 slices shown (3 of 3)]
[im 1/1]
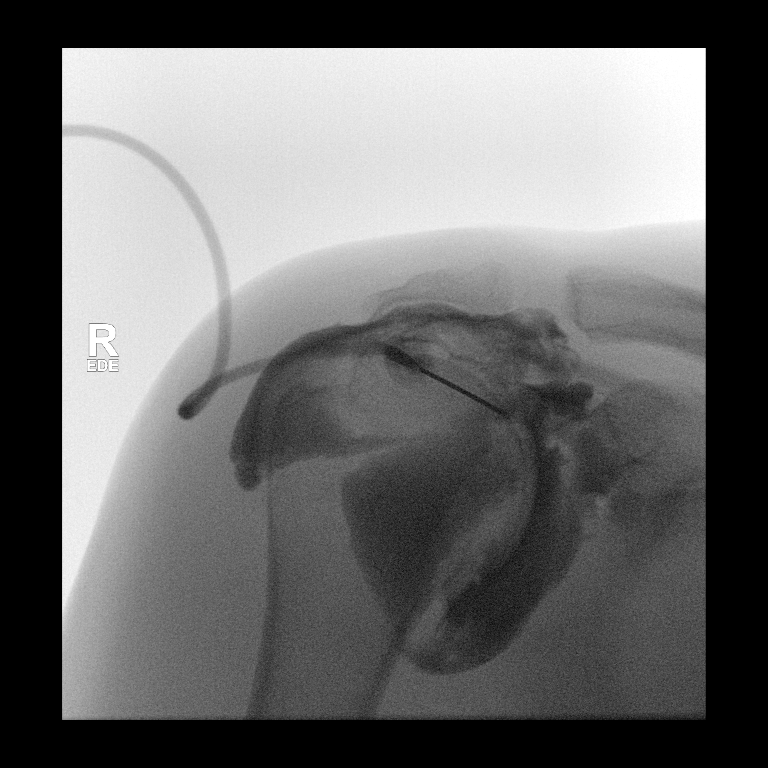

[3 of 3 positions shown; findings below may reference images not displayed]

FINDINGS: Spot fluoroscopic image captures of the right shoulder demonstrate
successful distention of the right glenohumeral joint with
radiodense contrast material, noting free flow of the contrast
material lateral and superior to the right humeral head into the
subdeltoid and subacromial bursae.
IMPRESSION: Technically successful right shoulder injection for MRI.

## 2018-01-05 MED FILL — AMPICILLIN TR 500 MG CAP: 500 | 30 days supply | Qty: 60 | Fill #3

## 2018-04-06 MED FILL — CROMOLYN 4% EYE DROPS: 4 | 24 days supply | Qty: 10 | Fill #0

## 2018-04-08 MED FILL — AMPICILLIN TR 500 MG CAP: 500 | 30 days supply | Qty: 60 | Fill #4

## 2018-04-08 MED FILL — CLINDAMYCIN PH 1% GEL: 1 | 30 days supply | Qty: 60 | Fill #2

## 2018-04-15 ENCOUNTER — Telehealth: Payer: Self-pay

## 2018-04-15 NOTE — Telephone Encounter (Signed)
Left message on voicemail for patient to call back. Okay for PEC/triage nurse to relay message to patient when she calls back.

## 2018-04-15 NOTE — Telephone Encounter (Signed)
Very nice lady.. No concern about transfer. Recommend Jessica Copland or Gwyneth Revels ( MDs) or Earlie Counts ( NP)  if they are accepting new pts, although any provider there is great!

## 2018-04-15 NOTE — Telephone Encounter (Signed)
Copied from [place] [id number]. Topic: Quick Communication - See Telephone Encounter >> Apr 15, 2018  9:49 AM Marja Kays F wrote: Pt is requesting that she be transferred to someone in [facility] instead of dr. Diona Browner -pt has moved closer [facility] location   Best number is [phone]

## 2018-04-21 NOTE — Telephone Encounter (Signed)
Spoke to patient by telephone and was advised that she had already called back and was given this information. Patient stated that she has already set up an appointment with a new PCP, but it is not until December. Patient stated that since this is so far off she may call back and schedule an appointment with Dr. Diona Browner before her December appointment.

## 2018-06-02 MED FILL — AMPICILLIN TR 500 MG CAP: 500 | 30 days supply | Qty: 60 | Fill #5

## 2018-06-23 ENCOUNTER — Other Ambulatory Visit: Payer: Self-pay | Admitting: Family Medicine

## 2018-06-23 DIAGNOSIS — Z1231 Encounter for screening mammogram for malignant neoplasm of breast: Secondary | ICD-10-CM

## 2018-07-29 ENCOUNTER — Ambulatory Visit: Payer: 59

## 2018-08-25 MED FILL — AMPICILLIN TR 500 MG CAP: 500 | 30 days supply | Qty: 60 | Fill #0

## 2018-09-10 ENCOUNTER — Ambulatory Visit
Admission: RE | Admit: 2018-09-10 | Discharge: 2018-09-10 | Disposition: A | Discharge status: Home / Self Care | Payer: 59 | Source: Ambulatory Visit | Attending: Family Medicine | Admitting: Family Medicine

## 2018-09-10 DIAGNOSIS — Z1231 Encounter for screening mammogram for malignant neoplasm of breast: Secondary | ICD-10-CM

## 2018-09-15 NOTE — Progress Notes (Addendum)
[place] at [facility] [address] [place], [place] [address] [phone]  Date:  09/17/2018   Name:  Taylor Cain   DOB:  Nov 22, 1960   MRN:  [id number]  PCP:  Darreld Mclean, MD    Chief Complaint: Transfer of Care (annual exam? )   History of Present Illness:  Taylor Cain is a 57 y.o. very pleasant female patient who presents with the following:  Transfer of care from Dr. Diona Browner at [facility], as patient is closer to this location. History of migraine headache, iron deficiency anemia, and GERD.  Her HA have gotten much better and are no longer really an issue. 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  This is a rather intense job  She does not have any particular concerns today, though admits that she did lose her mother following an illness this past October.  This is been a tough loss for her.  She feels as though she is handling things normally, but is still feeling fairly sad.  She notes that she is normally a very happy and positive person, so has surprised her that she is still feeling so down.  However, she does not feel that she is depressed, but just still mourning  mammo this month Pap 2 years ago- no recnet abnl Colon 2015- 10 year recall  She is nearly fasting today- had an egg and some nuts early this am  In her free time she likes to read and ride bikes She does try to be active and go to the gym; however she has not done as much since her mother was so ill.  She is a pt of Jarome Matin at [facility] for her rosacea -she uses topical clindamycin and oral amoxicillin  Her health maintenance is up-to-date at this time Offered to do a Pap today, but she would prefer to do next year which is fine  Wt Readings from Last 3 Encounters:  09/17/18 137 lb (62.1 kg)  05/23/17 135 lb 12 oz (61.6 kg)  01/02/17 134 lb (60.8 kg)      Patient Active Problem List   Diagnosis Date Noted  . Low back strain, initial encounter 05/23/2017  . Acne vulgaris 01/29/2013  . Iron deficiency anemia 01/29/2013  . Migraine without aura 10/15/2007  . PREMATURE VENTRICULAR CONTRACTIONS 10/15/2007  . GERD 10/15/2007    Past Medical History:  Diagnosis Date  . Anemia   . Arthritis    right shoulder  . Heart palpitations    made aware when patient was in 20's-30's; states they are very infrequent and pt. remains asymptomatic  . History of pneumonia   . Seasonal allergies     Past Surgical History:  Procedure Laterality Date  . COLONOSCOPY    . KNEE ARTHROSCOPY Right 1990, 1992   torn acl  . SHOULDER ARTHROSCOPY WITH ROTATOR CUFF REPAIR Right 01/02/2017   Procedure: RIGHT SHOULDER ARTHROSCOPY WITH REVISION OF ROTATOR CUFF REPAIR;  Surgeon: Justice Britain, MD;  Location: [place];  Service: Orthopedics;  Laterality: Right;  . SHOULDER ARTHROSCOPY WITH ROTATOR CUFF REPAIR AND SUBACROMIAL DECOMPRESSION Right 07/25/2016   Procedure: RIGHT SHOULDER ARTHROSCOPY WITH ROTATOR CUFF REPAIR AND SUBACROMIAL DECOMPRESSION, DISTAL CLAVICLE RESECTION;  Surgeon: Justice Britain, MD;  Location: [place];  Service: Orthopedics;  Laterality: Right;    Social History   Tobacco Use  .  Smoking status: Never Smoker  . Smokeless tobacco: Never Used  Substance Use Topics  . Alcohol use: Yes    Alcohol/week: 3.0 standard drinks    Types: 3 Glasses of wine per week    Comment: per week  . Drug use: No    Family History  Problem Relation Age of Onset  . Migraines Mother   . Hypertension Mother   . Hypertension Maternal Grandmother   . Alzheimer's disease Maternal Grandmother   . Diabetes Maternal Grandfather   . Colon cancer Neg Hx     Allergies  Allergen Reactions  . Acetaminophen-Codeine Anxiety    "patien becomes antsy/anxious"    Medication list has been reviewed and updated.  Current Outpatient Medications on File Prior to Visit   Medication Sig Dispense Refill  . amoxicillin (AMOXIL) 500 MG capsule Take 500 mg by mouth 2 (two) times daily. Therapy continuously  6  . CALCIUM PO Take 1 tablet by mouth daily.    . Cholecalciferol (D3 SUPER STRENGTH) 2000 UNITS CAPS Take 2,000 Units by mouth daily.     . clindamycin (CLINDAGEL) 1 % gel Apply 1 application topically daily.     . Ferrous Sulfate (IRON) 325 (65 FE) MG TABS Take 325 mg by mouth 2 (two) times daily.     . Pyridoxine HCl (VITAMIN B-6 PO) Take 1 tablet by mouth daily.    Marland Kitchen trimethoprim-polymyxin b (POLYTRIM) ophthalmic solution Place 2 drops into both eyes every 6 (six) hours. 10 mL 0   No current facility-administered medications on file prior to visit.     Review of Systems:  As per HPI- otherwise negative.  LMP was 3 to 4 years ago.  No postmenopausal bleeding. No chest pain or shortness of breath Physical Examination: Vitals:   09/17/18 1505  BP: 120/72  Pulse: (!) 58  Resp: 16  Temp: 98 F (36.7 C)  SpO2: 97%   Vitals:   09/17/18 1505  Weight: 137 lb (62.1 kg)  Height: 5\' 6"  (1.676 m)   Body mass index is 22.11 kg/m. Ideal Body Weight: Weight in (lb) to have BMI = 25: 154.6  GEN: WDWN, NAD, Non-toxic, A & O x 3, slim build, looks well HEENT: Atraumatic, Normocephalic. Neck supple. No masses, No LAD. Bilateral TM wnl, oropharynx normal.  PEERL,EOMI.   Ears and Nose: No external deformity. CV: RRR, No M/G/R. No JVD. No thrill. No extra heart sounds. PULM: CTA B, no wheezes, crackles, rhonchi. No retractions. No resp. distress. No accessory muscle use. ABD: S, NT, ND, +BS. No rebound. No HSM. EXTR: No c/c/e NEURO Normal gait.  PSYCH: Normally interactive. Conversant. Not depressed or anxious appearing.  Calm demeanor.    Assessment and Plan: Iron deficiency anemia secondary to inadequate dietary iron intake - Plan: CBC, Ferritin  Screening for diabetes mellitus - Plan: Comprehensive metabolic panel, Hemoglobin A1c  Screening for  hyperlipidemia - Plan: Lipid panel  Physical exam  Taylor Cain is here today as a new patient, transferring from the [place] location She is generally in good health, but that she does take iron for history of iron deficiency.  She also has rosacea which is managed by dermatology. Performed a physical exam for her today.  Obtain labs as above.  She plans to get the shingles vaccine after the holidays We will be in touch with her pending her labs. Asked her to see me in 6 to 12 months  Signed Lamar Blinks, MD  Received her labs 12/20 Results for  orders placed or performed in visit on 09/17/18  CBC  Result Value Ref Range   WBC 4.1 4.0 - 10.5 K/uL   RBC 4.20 3.87 - 5.11 Mil/uL   Platelets 174.0 150.0 - 400.0 K/uL   Hemoglobin 13.4 12.0 - 15.0 g/dL   HCT 40.0 36.0 - 46.0 %   MCV 95.4 78.0 - 100.0 fl   MCHC 33.4 30.0 - 36.0 g/dL   RDW 13.3 11.5 - 15.5 %  Comprehensive metabolic panel  Result Value Ref Range   Sodium 137 135 - 145 mEq/L   Potassium 4.3 3.5 - 5.1 mEq/L   Chloride 98 96 - 112 mEq/L   CO2 32 19 - 32 mEq/L   Glucose, Bld 86 70 - 99 mg/dL   BUN 15 6 - 23 mg/dL   Creatinine, Ser 0.80 0.40 - 1.20 mg/dL   Total Bilirubin 0.3 0.2 - 1.2 mg/dL   Alkaline Phosphatase 45 39 - 117 U/L   AST 18 0 - 37 U/L   ALT 10 0 - 35 U/L   Total Protein 7.0 6.0 - 8.3 g/dL   Albumin 4.6 3.5 - 5.2 g/dL   Calcium 9.4 8.4 - 10.5 mg/dL   GFR 77.85 >60.00 mL/min  Hemoglobin A1c  Result Value Ref Range   Hgb A1c MFr Bld 5.5 4.6 - 6.5 %  Lipid panel  Result Value Ref Range   Cholesterol 189 0 - 200 mg/dL   Triglycerides 59.0 0.0 - 149.0 mg/dL   HDL 77.50 >39.00 mg/dL   VLDL 11.8 0.0 - 40.0 mg/dL   LDL Cholesterol 100 (H) 0 - 99 mg/dL   Total CHOL/HDL Ratio 2    NonHDL 111.37   Ferritin  Result Value Ref Range   Ferritin 162.4 10.0 - 291.0 ng/mL   Message to pt

## 2018-09-17 ENCOUNTER — Ambulatory Visit (INDEPENDENT_AMBULATORY_CARE_PROVIDER_SITE_OTHER): Payer: 59 | Admitting: Family Medicine

## 2018-09-17 ENCOUNTER — Encounter

## 2018-09-17 ENCOUNTER — Encounter: Payer: Self-pay | Admitting: Family Medicine

## 2018-09-17 VITALS — BP 120/72 | HR 58 | Temp 98.0°F | Resp 16 | Ht 66.0 in | Wt 137.0 lb

## 2018-09-17 DIAGNOSIS — D508 Other iron deficiency anemias: Secondary | ICD-10-CM

## 2018-09-17 DIAGNOSIS — Z0000 Encounter for general adult medical examination without abnormal findings: Secondary | ICD-10-CM

## 2018-09-17 DIAGNOSIS — Z13220 Encounter for screening for lipoid disorders: Secondary | ICD-10-CM | POA: Diagnosis not present

## 2018-09-17 DIAGNOSIS — Z131 Encounter for screening for diabetes mellitus: Secondary | ICD-10-CM

## 2018-09-17 NOTE — Patient Instructions (Signed)
It was very nice to meet you today.  We look forward to seeing you in the future. I will be in touch with your labs ASAP, will send them to you via MyChart. We can plan to visit in the office again in 6 to 12 months, but let me know if you need anything in the meantime. I am so sorry for the loss of your mother, please let me know if I can do anything to help as you cope with this change. You may also want to have the shingles vaccine done after the holidays.  This is a 2 shot series which we can do here at the office if you like.  Health Maintenance, Female Adopting a healthy lifestyle and getting preventive care can go a long way to promote health and wellness. Talk with your health care provider about what schedule of regular examinations is right for you. This is a good chance for you to check in with your provider about disease prevention and staying healthy. In between checkups, there are plenty of things you can do on your own. Experts have done a lot of research about which lifestyle changes and preventive measures are most likely to keep you healthy. Ask your health care provider for more information. Weight and diet Eat a healthy diet  Be sure to include plenty of vegetables, fruits, low-fat dairy products, and lean protein.  Do not eat a lot of foods high in solid fats, added sugars, or salt.  Get regular exercise. This is one of the most important things you can do for your health. ? Most adults should exercise for at least 150 minutes each week. The exercise should increase your heart rate and make you sweat (moderate-intensity exercise). ? Most adults should also do strengthening exercises at least twice a week. This is in addition to the moderate-intensity exercise. Maintain a healthy weight  Body mass index (BMI) is a measurement that can be used to identify possible weight problems. It estimates body fat based on height and weight. Your health care provider can help determine  your BMI and help you achieve or maintain a healthy weight.  For females 57 years of age and older: ? A BMI below 18.5 is considered underweight. ? A BMI of 18.5 to 24.9 is normal. ? A BMI of 25 to 29.9 is considered overweight. ? A BMI of 30 and above is considered obese. Watch levels of cholesterol and blood lipids  You should start having your blood tested for lipids and cholesterol at 59 years of age, then have this test every 5 years.  You may need to have your cholesterol levels checked more often if: ? Your lipid or cholesterol levels are high. ? You are older than 59 years of age. ? You are at high risk for heart disease. Cancer screening Lung Cancer  Lung cancer screening is recommended for adults 78-41 years old who are at high risk for lung cancer because of a history of smoking.  A yearly low-dose CT scan of the lungs is recommended for people who: ? Currently smoke. ? Have quit within the past 15 years. ? Have at least a 30-pack-year history of smoking. A pack year is smoking an average of one pack of cigarettes a day for 1 year.  Yearly screening should continue until it has been 15 years since you quit.  Yearly screening should stop if you develop a health problem that would prevent you from having lung cancer treatment. Breast Cancer  Practice breast self-awareness. This means understanding how your breasts normally appear and feel.  It also means doing regular breast self-exams. Let your health care provider know about any changes, no matter how small.  If you are in your 20s or 30s, you should have a clinical breast exam (CBE) by a health care provider every 1-3 years as part of a regular health exam.  If you are 40 or older, have a CBE every year. Also consider having a breast X-ray (mammogram) every year.  If you have a family history of breast cancer, talk to your health care provider about genetic screening.  If you are at high risk for breast cancer,  talk to your health care provider about having an MRI and a mammogram every year.  Breast cancer gene (BRCA) assessment is recommended for women who have family members with BRCA-related cancers. BRCA-related cancers include: ? Breast. ? Ovarian. ? Tubal. ? Peritoneal cancers.  Results of the assessment will determine the need for genetic counseling and BRCA1 and BRCA2 testing. Cervical Cancer Your health care provider may recommend that you be screened regularly for cancer of the pelvic organs (ovaries, uterus, and vagina). This screening involves a pelvic examination, including checking for microscopic changes to the surface of your cervix (Pap test). You may be encouraged to have this screening done every 3 years, beginning at age 64.  For women ages 44-65, health care providers may recommend pelvic exams and Pap testing every 3 years, or they may recommend the Pap and pelvic exam, combined with testing for human papilloma virus (HPV), every 5 years. Some types of HPV increase your risk of cervical cancer. Testing for HPV may also be done on women of any age with unclear Pap test results.  Other health care providers may not recommend any screening for nonpregnant women who are considered low risk for pelvic cancer and who do not have symptoms. Ask your health care provider if a screening pelvic exam is right for you.  If you have had past treatment for cervical cancer or a condition that could lead to cancer, you need Pap tests and screening for cancer for at least 20 years after your treatment. If Pap tests have been discontinued, your risk factors (such as having a new sexual partner) need to be reassessed to determine if screening should resume. Some women have medical problems that increase the chance of getting cervical cancer. In these cases, your health care provider may recommend more frequent screening and Pap tests. Colorectal Cancer  This type of cancer can be detected and often  prevented.  Routine colorectal cancer screening usually begins at 59 years of age and continues through 59 years of age.  Your health care provider may recommend screening at an earlier age if you have risk factors for colon cancer.  Your health care provider may also recommend using home test kits to check for hidden blood in the stool.  A small camera at the end of a tube can be used to examine your colon directly (sigmoidoscopy or colonoscopy). This is done to check for the earliest forms of colorectal cancer.  Routine screening usually begins at age 52.  Direct examination of the colon should be repeated every 5-10 years through 59 years of age. However, you may need to be screened more often if early forms of precancerous polyps or small growths are found. Skin Cancer  Check your skin from head to toe regularly.  Tell your health care provider about any  new moles or changes in moles, especially if there is a change in a mole's shape or color.  Also tell your health care provider if you have a mole that is larger than the size of a pencil eraser.  Always use sunscreen. Apply sunscreen liberally and repeatedly throughout the day.  Protect yourself by wearing long sleeves, pants, a wide-brimmed hat, and sunglasses whenever you are outside. Heart disease, diabetes, and high blood pressure  High blood pressure causes heart disease and increases the risk of stroke. High blood pressure is more likely to develop in: ? People who have blood pressure in the high end of the normal range (130-139/85-89 mm Hg). ? People who are overweight or obese. ? People who are African American.  If you are 13-49 years of age, have your blood pressure checked every 3-5 years. If you are 59 years of age or older, have your blood pressure checked every year. You should have your blood pressure measured twice-once when you are at a hospital or clinic, and once when you are not at a hospital or clinic. Record  the average of the two measurements. To check your blood pressure when you are not at a hospital or clinic, you can use: ? An automated blood pressure machine at a pharmacy. ? A home blood pressure monitor.  If you are between 7 years and 37 years old, ask your health care provider if you should take aspirin to prevent strokes.  Have regular diabetes screenings. This involves taking a blood sample to check your fasting blood sugar level. ? If you are at a normal weight and have a low risk for diabetes, have this test once every three years after 59 years of age. ? If you are overweight and have a high risk for diabetes, consider being tested at a younger age or more often. Preventing infection Hepatitis B  If you have a higher risk for hepatitis B, you should be screened for this virus. You are considered at high risk for hepatitis B if: ? You were born in a country where hepatitis B is common. Ask your health care provider which countries are considered high risk. ? Your parents were born in a high-risk country, and you have not been immunized against hepatitis B (hepatitis B vaccine). ? You have HIV or AIDS. ? You use needles to inject street drugs. ? You live with someone who has hepatitis B. ? You have had sex with someone who has hepatitis B. ? You get hemodialysis treatment. ? You take certain medicines for conditions, including cancer, organ transplantation, and autoimmune conditions. Hepatitis C  Blood testing is recommended for: ? Everyone born from [birth year] through 1967. ? Anyone with known risk factors for hepatitis C. Sexually transmitted infections (STIs)  You should be screened for sexually transmitted infections (STIs) including gonorrhea and chlamydia if: ? You are sexually active and are younger than 59 years of age. ? You are older than 59 years of age and your health care provider tells you that you are at risk for this type of infection. ? Your sexual activity has  changed since you were last screened and you are at an increased risk for chlamydia or gonorrhea. Ask your health care provider if you are at risk.  If you do not have HIV, but are at risk, it may be recommended that you take a prescription medicine daily to prevent HIV infection. This is called pre-exposure prophylaxis (PrEP). You are considered at risk if: ?  You are sexually active and do not regularly use condoms or know the HIV status of your partner(s). ? You take drugs by injection. ? You are sexually active with a partner who has HIV. Talk with your health care provider about whether you are at high risk of being infected with HIV. If you choose to begin PrEP, you should first be tested for HIV. You should then be tested every 3 months for as long as you are taking PrEP. Pregnancy  If you are premenopausal and you may become pregnant, ask your health care provider about preconception counseling.  If you may become pregnant, take 400 to 800 micrograms (mcg) of folic acid every day.  If you want to prevent pregnancy, talk to your health care provider about birth control (contraception). Osteoporosis and menopause  Osteoporosis is a disease in which the bones lose minerals and strength with aging. This can result in serious bone fractures. Your risk for osteoporosis can be identified using a bone density scan.  If you are 47 years of age or older, or if you are at risk for osteoporosis and fractures, ask your health care provider if you should be screened.  Ask your health care provider whether you should take a calcium or vitamin D supplement to lower your risk for osteoporosis.  Menopause may have certain physical symptoms and risks.  Hormone replacement therapy may reduce some of these symptoms and risks. Talk to your health care provider about whether hormone replacement therapy is right for you. Follow these instructions at home:  Schedule regular health, dental, and eye  exams.  Stay current with your immunizations.  Do not use any tobacco products including cigarettes, chewing tobacco, or electronic cigarettes.  If you are pregnant, do not drink alcohol.  If you are breastfeeding, limit how much and how often you drink alcohol.  Limit alcohol intake to no more than 1 drink per day for nonpregnant women. One drink equals 12 ounces of beer, 5 ounces of wine, or 1 ounces of hard liquor.  Do not use street drugs.  Do not share needles.  Ask your health care provider for help if you need support or information about quitting drugs.  Tell your health care provider if you often feel depressed.  Tell your health care provider if you have ever been abused or do not feel safe at home. This information is not intended to replace advice given to you by your health care provider. Make sure you discuss any questions you have with your health care provider. Document Released: 04/01/2011 Document Revised: 02/22/2016 Document Reviewed: 06/20/2015 Elsevier Interactive Patient Education  2019 [employer].

## 2018-09-18 ENCOUNTER — Encounter: Payer: Self-pay | Admitting: Family Medicine

## 2018-09-18 DIAGNOSIS — D225 Melanocytic nevi of trunk: Secondary | ICD-10-CM | POA: Diagnosis not present

## 2018-09-18 DIAGNOSIS — L570 Actinic keratosis: Secondary | ICD-10-CM | POA: Diagnosis not present

## 2018-09-18 DIAGNOSIS — L718 Other rosacea: Secondary | ICD-10-CM | POA: Diagnosis not present

## 2018-09-18 DIAGNOSIS — D2261 Melanocytic nevi of right upper limb, including shoulder: Secondary | ICD-10-CM | POA: Diagnosis not present

## 2018-09-18 DIAGNOSIS — D2262 Melanocytic nevi of left upper limb, including shoulder: Secondary | ICD-10-CM | POA: Diagnosis not present

## 2018-09-18 DIAGNOSIS — D485 Neoplasm of uncertain behavior of skin: Secondary | ICD-10-CM | POA: Diagnosis not present

## 2018-09-18 DIAGNOSIS — D2271 Melanocytic nevi of right lower limb, including hip: Secondary | ICD-10-CM | POA: Diagnosis not present

## 2018-09-18 DIAGNOSIS — D2272 Melanocytic nevi of left lower limb, including hip: Secondary | ICD-10-CM | POA: Diagnosis not present

## 2018-09-18 DIAGNOSIS — L812 Freckles: Secondary | ICD-10-CM | POA: Diagnosis not present

## 2018-09-18 LAB — CBC
HEMATOCRIT: 40 % (ref 36.0–46.0)
Hemoglobin: 13.4 g/dL (ref 12.0–15.0)
MCHC: 33.4 g/dL (ref 30.0–36.0)
MCV: 95.4 fl (ref 78.0–100.0)
Platelets: 174 10*3/uL (ref 150.0–400.0)
RBC: 4.2 Mil/uL (ref 3.87–5.11)
RDW: 13.3 % (ref 11.5–15.5)
WBC: 4.1 10*3/uL (ref 4.0–10.5)

## 2018-09-18 LAB — COMPREHENSIVE METABOLIC PANEL
ALBUMIN: 4.6 g/dL (ref 3.5–5.2)
ALT: 10 U/L (ref 0–35)
AST: 18 U/L (ref 0–37)
Alkaline Phosphatase: 45 U/L (ref 39–117)
BUN: 15 mg/dL (ref 6–23)
CALCIUM: 9.4 mg/dL (ref 8.4–10.5)
CHLORIDE: 98 meq/L (ref 96–112)
CO2: 32 mEq/L (ref 19–32)
Creatinine, Ser: 0.8 mg/dL (ref 0.40–1.20)
GFR: 77.85 mL/min (ref >60.00)
Glucose, Bld: 86 mg/dL (ref 70–99)
Potassium: 4.3 mEq/L (ref 3.5–5.1)
SODIUM: 137 meq/L (ref 135–145)
Total Bilirubin: 0.3 mg/dL (ref 0.2–1.2)
Total Protein: 7 g/dL (ref 6.0–8.3)

## 2018-09-18 LAB — LIPID PANEL
CHOLESTEROL: 189 mg/dL (ref 0–200)
HDL: 77.5 mg/dL (ref >39.00)
LDL Cholesterol: 100 mg/dL — ABNORMAL HIGH (ref 0–99)
NonHDL: 111.37
Total CHOL/HDL Ratio: 2
Triglycerides: 59 mg/dL (ref 0.0–149.0)
VLDL: 11.8 mg/dL (ref 0.0–40.0)

## 2018-09-18 LAB — HEMOGLOBIN A1C: HEMOGLOBIN A1C: 5.5 % (ref 4.6–6.5)

## 2018-09-18 LAB — FERRITIN: Ferritin: 162.4 ng/mL (ref 10.0–291.0)

## 2018-09-18 MED FILL — CLINDAMYCIN PH 1% GEL: 1 | 30 days supply | Qty: 60 | Fill #0

## 2018-09-18 MED FILL — FLUOROURACIL 5% CREAM: 5 | 14 days supply | Qty: 40 | Fill #0

## 2018-10-12 MED FILL — AMPICILLIN TR 500 MG CAP: 500 | 30 days supply | Qty: 60 | Fill #1

## 2018-11-12 DIAGNOSIS — L988 Other specified disorders of the skin and subcutaneous tissue: Secondary | ICD-10-CM | POA: Diagnosis not present

## 2018-11-12 DIAGNOSIS — D485 Neoplasm of uncertain behavior of skin: Secondary | ICD-10-CM | POA: Diagnosis not present

## 2018-12-21 MED FILL — AMPICILLIN TR 500 MG CAP: 500 | 30 days supply | Qty: 60 | Fill #0

## 2018-12-23 MED FILL — AZELASTINE HCL 0.05% DROPS: 0.05 | 45 days supply | Qty: 6 | Fill #0

## 2019-04-09 MED FILL — AMPICILLIN TR 500 MG CAP: 500 | 30 days supply | Qty: 60 | Fill #1

## 2019-05-18 MED FILL — AMPICILLIN TR 500 MG CAP: 500 | 30 days supply | Qty: 60 | Fill #2

## 2019-06-04 MED FILL — CLINDAMYCIN PH 1% GEL: 1 | 30 days supply | Qty: 60 | Fill #1

## 2019-06-09 MED FILL — CROMOLYN 4% EYE DROPS: 4 | 25 days supply | Qty: 10 | Fill #0

## 2019-06-17 MED FILL — CLINDAMYCIN PH 1% GEL: 1 | 30 days supply | Qty: 60 | Fill #1

## 2019-07-05 ENCOUNTER — Telehealth: Payer: Self-pay

## 2019-07-05 NOTE — Telephone Encounter (Signed)

## 2019-07-06 ENCOUNTER — Ambulatory Visit (INDEPENDENT_AMBULATORY_CARE_PROVIDER_SITE_OTHER): Payer: Self-pay | Admitting: Plastic Surgery

## 2019-07-06 ENCOUNTER — Other Ambulatory Visit: Payer: Self-pay

## 2019-07-06 ENCOUNTER — Encounter: Payer: Self-pay | Admitting: Plastic Surgery

## 2019-07-06 DIAGNOSIS — Z719 Counseling, unspecified: Secondary | ICD-10-CM | POA: Insufficient documentation

## 2019-07-06 NOTE — Progress Notes (Signed)
Botulinum Toxin and Filler Injection Procedure Note  Procedure: Cosmetic botulinum toxin and Filler administration  Pre-operative Diagnosis: Dynamic rhytides and midface volume loss  Post-operative Diagnosis: Same  Complications:  None  Brief history: The patient desires botulinum toxin injection of her forehead. I discussed with the patient this proposed procedure of botulinum toxin injections, which is customized depending on the particular needs of the patient. It is performed on facial rhytids as a temporary correction. The alternatives were discussed with the patient. The risks were addressed including bleeding, scarring, infection, damage to deeper structures, asymmetry, and chronic pain, which may occur infrequently after a procedure. The individual's choice to undergo a surgical procedure is based on the comparison of risks to potential benefits. Other risks include unsatisfactory results, brow ptosis, eyelid ptosis, allergic reaction, temporary paralysis, which should go away with time, bruising, blurring disturbances and delayed healing. Botulinum toxin injections do not arrest the aging process or produce permanent tightening of the eyelid.  Operative intervention maybe necessary to maintain the results of a blepharoplasty or botulinum toxin. The patient understands and wishes to proceed. An informed consent was signed and informational brochures given to her prior to the procedure.  Procedure: The area was prepped with alcohol and dried with a clean gauze. Using a clean technique, the botulinum toxin was diluted with 1.25 cc of preservative-free normal saline which was slowly injected with an 18 gauge needle in a tuberculin syringes.  A 32 gauge needles were then used to inject the botulinum toxin. This mixture allow for an aliquot of 5 units per 0.1 cc in each injection site.    Subsequently the mixture was injected in the glabellar and forehead area with preservation of the temporal  branch to the lateral eyebrow as well as into each lateral canthal area beginning from the lateral orbital rim medial to the zygomaticus major in 3 separate areas. A total of 20 Units of botulinum toxin was used. The forehead and glabellar area was injected with care to inject intramuscular only while holding pressure on the supratrochlear vessels in each area during each injection on either side of the medial corrugators. The injection proceeded vertically superiorly to the medial 2/3 of the frontalis muscle and superior 2/3 of the lateral frontalis, again with preservation of the frontal branch.  The midface area was injected at the 3 sub-regions of the mid-face: zygomaticomalar region, anteromedial cheek region, and submalar region for a total of one syringe on each side of the face. The technique used was serial puncture with equal injections in the 3 sub-regions: the zygomaticomalar region, the anteromedial cheek, and the submalar region.  No complications were noted. Light pressure was held for 5 minutes. She was instructed explicitly in post-operative care.  Botox LOT:  [id number] [id number] EXP:  01/2022  Restylane Lyft LOT: [id number] EXP: 2021-04-29

## 2019-07-28 MED FILL — AMPICILLIN TR 500 MG CAP: 500 | 30 days supply | Qty: 60 | Fill #3

## 2019-09-17 ENCOUNTER — Other Ambulatory Visit: Payer: Self-pay

## 2019-09-17 MED FILL — AMPICILLIN TR 500 MG CAP: 500 | 30 days supply | Qty: 60 | Fill #0

## 2019-09-18 NOTE — Progress Notes (Deleted)
[place] at [facility] [address], [place], [place] [address] [phone]  Date:  09/20/2019   Name:  Taylor Cain   DOB:  06-22-1961   MRN:  [id number]  PCP:  Darreld Mclean, MD    Chief Complaint: No chief complaint on file.   History of Present Illness:  Taylor Cain is a 58 y.o. 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 Encounter for counseling 07/06/2019  . Low back strain, initial encounter 05/23/2017  . Acne vulgaris 01/29/2013  . Iron deficiency anemia 01/29/2013  . Migraine without aura 10/15/2007  . PREMATURE VENTRICULAR CONTRACTIONS 10/15/2007  . GERD 10/15/2007    Past Medical History:  Diagnosis Date  . Anemia   . Arthritis    right shoulder  . Heart palpitations    made aware when patient was in 20's-30's; states they are very infrequent and pt. remains asymptomatic  . History of pneumonia   . Seasonal allergies     Past Surgical History:  Procedure Laterality Date  . COLONOSCOPY    . KNEE ARTHROSCOPY Right 1990, 1992   torn acl  . SHOULDER ARTHROSCOPY WITH ROTATOR CUFF REPAIR Right 01/02/2017   Procedure: RIGHT SHOULDER ARTHROSCOPY WITH REVISION OF ROTATOR CUFF REPAIR;  Surgeon: Justice Britain, MD;  Location: [place];  Service: Orthopedics;  Laterality: Right;  . SHOULDER ARTHROSCOPY WITH ROTATOR CUFF REPAIR AND SUBACROMIAL DECOMPRESSION Right 07/25/2016   Procedure: RIGHT SHOULDER ARTHROSCOPY WITH ROTATOR CUFF REPAIR AND SUBACROMIAL DECOMPRESSION, DISTAL CLAVICLE RESECTION;  Surgeon: Justice Britain, MD;  Location: [facility];  Service: Orthopedics;   Laterality: Right;    Social History   Tobacco Use  . Smoking status: Never Smoker  . Smokeless tobacco: Never Used  Substance Use Topics  . Alcohol use: Yes    Alcohol/week: 3.0 standard drinks    Types: 3 Glasses of wine per week    Comment: per week  . Drug use: No    Family History  Problem Relation Age of Onset  . Migraines Mother   . Hypertension Mother   . Hypertension Maternal Grandmother   . Alzheimer's disease Maternal Grandmother   . Diabetes Maternal Grandfather   . Colon cancer Neg Hx     Allergies  Allergen Reactions  . Acetaminophen-Codeine Anxiety    "patien becomes antsy/anxious"    Medication list has been reviewed and updated.  Current Outpatient Medications on File Prior to Visit  Medication Sig Dispense Refill  . amoxicillin (AMOXIL) 500 MG capsule Take 500 mg by mouth 2 (two) times daily. Therapy continuously  6  . CALCIUM PO Take 1 tablet by mouth daily.    . Cholecalciferol (D3 SUPER STRENGTH) 2000 UNITS CAPS Take 2,000 Units by mouth daily.     . clindamycin (CLINDAGEL) 1 % gel Apply 1 application topically daily.     . cromolyn (OPTICROM) 4 % ophthalmic solution Place 1 drop into both eyes daily.    . Ferrous Sulfate (IRON) 325 (65 FE) MG TABS Take 325 mg by mouth 2 (  two) times daily.      No current facility-administered medications on file prior to visit.    Review of Systems:  As per HPI- otherwise negative.   Physical Examination: There were no vitals filed for this visit. There were no vitals filed for this visit. There is no height or weight on file to calculate BMI. Ideal Body Weight:    GEN: WDWN, NAD, Non-toxic, A & O x 3 HEENT: Atraumatic, Normocephalic. Neck supple. No masses, No LAD. Ears and Nose: No external deformity. CV: RRR, No M/G/R. No JVD. No thrill. No extra heart sounds. PULM: CTA B, no wheezes, crackles, rhonchi. No retractions. No resp. distress. No accessory muscle use. ABD: S, NT, ND, +BS. No rebound. No  HSM. EXTR: No c/c/e NEURO Normal gait.  PSYCH: Normally interactive. Conversant. Not depressed or anxious appearing.  Calm demeanor.    Assessment and Plan: *** This visit occurred during the SARS-CoV-2 public health emergency.  Safety protocols were in place, including screening questions prior to the visit, additional usage of staff PPE, and extensive cleaning of exam room while observing appropriate contact time as indicated for disinfecting solutions.    Signed Lamar Blinks, MD

## 2019-09-19 ENCOUNTER — Encounter: Payer: Self-pay | Admitting: Family Medicine

## 2019-09-20 ENCOUNTER — Ambulatory Visit: Payer: 59 | Admitting: Family Medicine

## 2019-10-04 DIAGNOSIS — D225 Melanocytic nevi of trunk: Secondary | ICD-10-CM | POA: Diagnosis not present

## 2019-10-04 DIAGNOSIS — D1801 Hemangioma of skin and subcutaneous tissue: Secondary | ICD-10-CM | POA: Diagnosis not present

## 2019-10-04 DIAGNOSIS — D2261 Melanocytic nevi of right upper limb, including shoulder: Secondary | ICD-10-CM | POA: Diagnosis not present

## 2019-10-04 DIAGNOSIS — L570 Actinic keratosis: Secondary | ICD-10-CM | POA: Diagnosis not present

## 2019-10-04 DIAGNOSIS — D2271 Melanocytic nevi of right lower limb, including hip: Secondary | ICD-10-CM | POA: Diagnosis not present

## 2019-10-04 DIAGNOSIS — D2272 Melanocytic nevi of left lower limb, including hip: Secondary | ICD-10-CM | POA: Diagnosis not present

## 2019-10-04 DIAGNOSIS — L821 Other seborrheic keratosis: Secondary | ICD-10-CM | POA: Diagnosis not present

## 2019-10-04 DIAGNOSIS — D2262 Melanocytic nevi of left upper limb, including shoulder: Secondary | ICD-10-CM | POA: Diagnosis not present

## 2019-10-04 DIAGNOSIS — L812 Freckles: Secondary | ICD-10-CM | POA: Diagnosis not present

## 2019-10-14 MED FILL — TRIAMCINOLONE 0.1% PASTE: 0.1 | 7 days supply | Qty: 5 | Fill #0

## 2019-11-02 NOTE — Patient Instructions (Addendum)
It was great to see you again today, I will be in touch with your labs as soon as possible I ordered a bone density scan and mammogram to be done at your convenience It appears that you have a cervical polyp- these are generally harmless, but can be removed in the office by GYN if bothersome or bleeding     Health Maintenance, Female Adopting a healthy lifestyle and getting preventive care are important in promoting health and wellness. Ask your health care provider about:  The right schedule for you to have regular tests and exams.  Things you can do on your own to prevent diseases and keep yourself healthy. What should I know about diet, weight, and exercise? Eat a healthy diet   Eat a diet that includes plenty of vegetables, fruits, low-fat dairy products, and lean protein.  Do not eat a lot of foods that are high in solid fats, added sugars, or sodium. Maintain a healthy weight Body mass index (BMI) is used to identify weight problems. It estimates body fat based on height and weight. Your health care provider can help determine your BMI and help you achieve or maintain a healthy weight. Get regular exercise Get regular exercise. This is one of the most important things you can do for your health. Most adults should:  Exercise for at least 150 minutes each week. The exercise should increase your heart rate and make you sweat (moderate-intensity exercise).  Do strengthening exercises at least twice a week. This is in addition to the moderate-intensity exercise.  Spend less time sitting. Even light physical activity can be beneficial. Watch cholesterol and blood lipids Have your blood tested for lipids and cholesterol at 61 years of age, then have this test every 5 years. Have your cholesterol levels checked more often if:  Your lipid or cholesterol levels are high.  You are older than 61 years of age.  You are at high risk for heart disease. What should I know about cancer  screening? Depending on your health history and family history, you may need to have cancer screening at various ages. This may include screening for:  Breast cancer.  Cervical cancer.  Colorectal cancer.  Skin cancer.  Lung cancer. What should I know about heart disease, diabetes, and high blood pressure? Blood pressure and heart disease  High blood pressure causes heart disease and increases the risk of stroke. This is more likely to develop in people who have high blood pressure readings, are of African descent, or are overweight.  Have your blood pressure checked: ? Every 3-5 years if you are 32-81 years of age. ? Every year if you are 65 years old or older. Diabetes Have regular diabetes screenings. This checks your fasting blood sugar level. Have the screening done:  Once every three years after age 15 if you are at a normal weight and have a low risk for diabetes.  More often and at a younger age if you are overweight or have a high risk for diabetes. What should I know about preventing infection? Hepatitis B If you have a higher risk for hepatitis B, you should be screened for this virus. Talk with your health care provider to find out if you are at risk for hepatitis B infection. Hepatitis C Testing is recommended for:  Everyone born from [birth year] through 1967.  Anyone with known risk factors for hepatitis C. Sexually transmitted infections (STIs)  Get screened for STIs, including gonorrhea and chlamydia, if: ? You are  sexually active and are younger than 61 years of age. ? You are older than 61 years of age and your health care provider tells you that you are at risk for this type of infection. ? Your sexual activity has changed since you were last screened, and you are at increased risk for chlamydia or gonorrhea. Ask your health care provider if you are at risk.  Ask your health care provider about whether you are at high risk for HIV. Your health care provider may  recommend a prescription medicine to help prevent HIV infection. If you choose to take medicine to prevent HIV, you should first get tested for HIV. You should then be tested every 3 months for as long as you are taking the medicine. Pregnancy  If you are about to stop having your period (premenopausal) and you may become pregnant, seek counseling before you get pregnant.  Take 400 to 800 micrograms (mcg) of folic acid every day if you become pregnant.  Ask for birth control (contraception) if you want to prevent pregnancy. Osteoporosis and menopause Osteoporosis is a disease in which the bones lose minerals and strength with aging. This can result in bone fractures. If you are 52 years old or older, or if you are at risk for osteoporosis and fractures, ask your health care provider if you should:  Be screened for bone loss.  Take a calcium or vitamin D supplement to lower your risk of fractures.  Be given hormone replacement therapy (HRT) to treat symptoms of menopause. Follow these instructions at home: Lifestyle  Do not use any products that contain nicotine or tobacco, such as cigarettes, e-cigarettes, and chewing tobacco. If you need help quitting, ask your health care provider.  Do not use street drugs.  Do not share needles.  Ask your health care provider for help if you need support or information about quitting drugs. Alcohol use  Do not drink alcohol if: ? Your health care provider tells you not to drink. ? You are pregnant, may be pregnant, or are planning to become pregnant.  If you drink alcohol: ? Limit how much you use to 0-1 drink a day. ? Limit intake if you are breastfeeding.  Be aware of how much alcohol is in your drink. In the [place] one drink equals one 12 oz bottle of beer (355 mL), one 5 oz glass of wine (148 mL), or one 1 oz glass of hard liquor (44 mL). General instructions  Schedule regular health, dental, and eye exams.  Stay current with your  vaccines.  Tell your health care provider if: ? You often feel depressed. ? You have ever been abused or do not feel safe at home. Summary  Adopting a healthy lifestyle and getting preventive care are important in promoting health and wellness.  Follow your health care provider's instructions about healthy diet, exercising, and getting tested or screened for diseases.  Follow your health care provider's instructions on monitoring your cholesterol and blood pressure. This information is not intended to replace advice given to you by your health care provider. Make sure you discuss any questions you have with your health care provider. Document Revised: 09/09/2018 Document Reviewed: 09/09/2018 Elsevier Patient Education  2020 [employer].

## 2019-11-02 NOTE — Progress Notes (Addendum)
[facility] [address], [place], [place] [address] [phone]  Date:  11/04/2019   Name:  Taylor Cain   DOB:  03-22-1961   MRN:  [id number]  PCP:  Darreld Mclean, MD    Chief Complaint: Annual Exam ( pap as well)   History of Present Illness:  Taylor Cain is a 59 y.o. 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 Jarome Matin  Mammogram December 2019-we will order for her today She is also interested in getting a bone density scan Can update Pap; will update today  Colon cancer screen up-to-date Offer Shingrix; will defer due to pandemic  Routine labs due today  No PMB  She has felt a small bump on her cervix for the last several months, it is not tender does not bleed but she wanted to mention it Patient Active Problem List   Diagnosis Date Noted  . Encounter for counseling 07/06/2019  . Low back strain, initial encounter 05/23/2017  . Acne vulgaris 01/29/2013  . Iron deficiency anemia 01/29/2013  . Migraine without aura 10/15/2007  . PREMATURE VENTRICULAR CONTRACTIONS 10/15/2007  . GERD 10/15/2007    Past Medical History:  Diagnosis Date  . Anemia   . Arthritis    right shoulder  . Heart palpitations    made aware when patient was in 20's-30's; states they are very infrequent and pt. remains asymptomatic  . History of pneumonia   . Seasonal allergies     Past Surgical History:  Procedure Laterality Date  . COLONOSCOPY    . KNEE ARTHROSCOPY Right 1990, 1992   torn acl  .  SHOULDER ARTHROSCOPY WITH ROTATOR CUFF REPAIR Right 01/02/2017   Procedure: RIGHT SHOULDER ARTHROSCOPY WITH REVISION OF ROTATOR CUFF REPAIR;  Surgeon: Justice Britain, MD;  Location: [facility];  Service: Orthopedics;  Laterality: Right;  . SHOULDER ARTHROSCOPY WITH ROTATOR CUFF REPAIR AND SUBACROMIAL DECOMPRESSION Right 07/25/2016   Procedure: RIGHT SHOULDER ARTHROSCOPY WITH ROTATOR CUFF REPAIR AND SUBACROMIAL DECOMPRESSION, DISTAL CLAVICLE RESECTION;  Surgeon: Justice Britain, MD;  Location: [facility];  Service: Orthopedics;  Laterality: Right;    Social History   Tobacco Use  . Smoking status: Never Smoker  . Smokeless tobacco: Never Used  Substance Use Topics  . Alcohol use: Yes    Alcohol/week: 3.0 standard drinks    Types: 3 Glasses of wine per week    Comment: per week  . Drug use: No    Family History  Problem Relation Age of Onset  . Migraines Mother   . Hypertension Mother   . Hypertension Maternal Grandmother   . Alzheimer's disease Maternal Grandmother   . Diabetes Maternal Grandfather   . Colon cancer Neg Hx     Allergies  Allergen Reactions  . Acetaminophen-Codeine Anxiety    "patien becomes antsy/anxious"    Medication list has been reviewed and updated.  Current Outpatient Medications on File Prior to Visit  Medication Sig Dispense Refill  . amoxicillin (AMOXIL) 500 MG capsule Take 500 mg by  mouth 2 (two) times daily. Therapy continuously  6  . CALCIUM PO Take 1 tablet by mouth daily.    . Cholecalciferol (D3 SUPER STRENGTH) 2000 UNITS CAPS Take 2,000 Units by mouth daily.     . clindamycin (CLINDAGEL) 1 % gel Apply 1 application topically daily.     . cromolyn (OPTICROM) 4 % ophthalmic solution Place 1 drop into both eyes daily.    . Ferrous Sulfate (IRON) 325 (65 FE) MG TABS Take 325 mg by mouth 2 (two) times daily.      No current facility-administered medications on file prior to visit.    Review of Systems:  As per HPI- otherwise negative. No chest pain or  shortness of breath  Physical Examination: Vitals:   11/04/19 1514  BP: 110/70  Pulse: 60  Resp: 16  Temp: (!) 96.6 F (35.9 C)  SpO2: 95%   Vitals:   11/04/19 1514  Weight: 141 lb (64 kg)  Height: 5\' 6"  (1.676 m)   Body mass index is 22.76 kg/m. Ideal Body Weight: Weight in (lb) to have BMI = 25: 154.6  GEN: WDWN, NAD, Non-toxic, A & O x 3, slim build, looks well HEENT: Atraumatic, Normocephalic. Neck supple. No masses, No LAD.  Bilateral TM wnl, oropharynx normal.  PEERL,EOMI.   Ears and Nose: No external deformity. CV: RRR, No M/G/R. No JVD. No thrill. No extra heart sounds. PULM: CTA B, no wheezes, crackles, rhonchi. No retractions. No resp. distress. No accessory muscle use. ABD: S, NT, ND, +BS. No rebound. No HSM. EXTR: No c/c/e NEURO Normal gait.  PSYCH: Normally interactive. Conversant. Not depressed or anxious appearing.  Calm demeanor.  Breast: normal exam, no masses/ dimpling/ discharge Pelvic: normal, no vaginal lesions or discharge. Uterus normal, no CMT, no adnexal tendereness or masses.  She has a small pea-sized polyp at the cervical os.  It does not bleed when Pap collected   Assessment and Plan: Physical exam  Iron deficiency anemia secondary to inadequate dietary iron intake - Plan: CBC, Ferritin  Screening for diabetes mellitus - Plan: Comprehensive metabolic panel, Hemoglobin A1c  Screening for hyperlipidemia - Plan: TSH  Screening for HIV (human immunodeficiency virus) - Plan: HIV Antibody (routine testing w rflx)  Encounter for screening mammogram for malignant neoplasm of breast - Plan: MM 3D SCREEN BREAST BILATERAL, CANCELED: MM 3D SCREEN BREAST BILATERAL  Screening for cervical cancer - Plan: Cytology - PAP  Estrogen deficiency - Plan: DG Bone Density, CANCELED: DG Bone Density  Here today for physical Labs pending as above Collected Pap We discussed cervical polyp, will make sure Pap is benign, ordered mammogram and bone density Will  plan further follow- up pending labs.  This visit occurred during the SARS-CoV-2 public health emergency.  Safety protocols were in place, including screening questions prior to the visit, additional usage of staff PPE, and extensive cleaning of exam room while observing appropriate contact time as indicated for disinfecting solutions.    Signed Lamar Blinks, MD  Received her labs 2/5- message to pt  Results for orders placed or performed in visit on 11/04/19  CBC  Result Value Ref Range   WBC 4.5 4.0 - 10.5 K/uL   RBC 4.19 3.87 - 5.11 Mil/uL   Platelets 183.0 150.0 - 400.0 K/uL   Hemoglobin 13.1 12.0 - 15.0 g/dL   HCT 39.4 36.0 - 46.0 %   MCV 94.1 78.0 - 100.0 fl   MCHC 33.2 30.0 - 36.0 g/dL   RDW 12.6 11.5 -  15.5 %  Comprehensive metabolic panel  Result Value Ref Range   Sodium 137 135 - 145 mEq/L   Potassium 4.1 3.5 - 5.1 mEq/L   Chloride 97 96 - 112 mEq/L   CO2 32 19 - 32 mEq/L   Glucose, Bld 86 70 - 99 mg/dL   BUN 13 6 - 23 mg/dL   Creatinine, Ser 0.82 0.40 - 1.20 mg/dL   Total Bilirubin 0.4 0.2 - 1.2 mg/dL   Alkaline Phosphatase 56 39 - 117 U/L   AST 22 0 - 37 U/L   ALT 14 0 - 35 U/L   Total Protein 7.3 6.0 - 8.3 g/dL   Albumin 4.6 3.5 - 5.2 g/dL   GFR 70.92 >60.00 mL/min   Calcium 9.6 8.4 - 10.5 mg/dL  Hemoglobin A1c  Result Value Ref Range   Hgb A1c MFr Bld 5.7 4.6 - 6.5 %  HIV Antibody (routine testing w rflx)  Result Value Ref Range   HIV 1&2 Ab, 4th Generation NON-REACTIVE NON-REACTI  TSH  Result Value Ref Range   TSH 2.23 0.35 - 4.50 uIU/mL  Ferritin  Result Value Ref Range   Ferritin 72.8 10.0 - 291.0 ng/mL

## 2019-11-03 ENCOUNTER — Encounter: Payer: 59 | Admitting: Family Medicine

## 2019-11-04 ENCOUNTER — Encounter: Payer: Self-pay | Admitting: Family Medicine

## 2019-11-04 ENCOUNTER — Ambulatory Visit (INDEPENDENT_AMBULATORY_CARE_PROVIDER_SITE_OTHER): Payer: 59 | Admitting: Family Medicine

## 2019-11-04 ENCOUNTER — Other Ambulatory Visit (HOSPITAL_COMMUNITY)
Admission: RE | Admit: 2019-11-04 | Discharge: 2019-11-04 | Disposition: A | Discharge status: Home / Self Care | Payer: 59 | Source: Ambulatory Visit | Attending: Family Medicine | Admitting: Family Medicine

## 2019-11-04 ENCOUNTER — Other Ambulatory Visit: Payer: Self-pay

## 2019-11-04 VITALS — BP 110/70 | HR 60 | Temp 96.6°F | Resp 16 | Ht 66.0 in | Wt 141.0 lb

## 2019-11-04 DIAGNOSIS — Z131 Encounter for screening for diabetes mellitus: Secondary | ICD-10-CM | POA: Diagnosis not present

## 2019-11-04 DIAGNOSIS — Z1231 Encounter for screening mammogram for malignant neoplasm of breast: Secondary | ICD-10-CM

## 2019-11-04 DIAGNOSIS — D508 Other iron deficiency anemias: Secondary | ICD-10-CM

## 2019-11-04 DIAGNOSIS — E2839 Other primary ovarian failure: Secondary | ICD-10-CM | POA: Diagnosis not present

## 2019-11-04 DIAGNOSIS — Z114 Encounter for screening for human immunodeficiency virus [HIV]: Secondary | ICD-10-CM

## 2019-11-04 DIAGNOSIS — Z124 Encounter for screening for malignant neoplasm of cervix: Secondary | ICD-10-CM | POA: Diagnosis not present

## 2019-11-04 DIAGNOSIS — Z13220 Encounter for screening for lipoid disorders: Secondary | ICD-10-CM

## 2019-11-04 DIAGNOSIS — Z0000 Encounter for general adult medical examination without abnormal findings: Secondary | ICD-10-CM | POA: Diagnosis not present

## 2019-11-05 ENCOUNTER — Encounter: Payer: Self-pay | Admitting: Family Medicine

## 2019-11-05 LAB — CBC
HCT: 39.4 % (ref 36.0–46.0)
Hemoglobin: 13.1 g/dL (ref 12.0–15.0)
MCHC: 33.2 g/dL (ref 30.0–36.0)
MCV: 94.1 fl (ref 78.0–100.0)
Platelets: 183 10*3/uL (ref 150.0–400.0)
RBC: 4.19 Mil/uL (ref 3.87–5.11)
RDW: 12.6 % (ref 11.5–15.5)
WBC: 4.5 10*3/uL (ref 4.0–10.5)

## 2019-11-05 LAB — TSH: TSH: 2.23 u[IU]/mL (ref 0.35–4.50)

## 2019-11-05 LAB — COMPREHENSIVE METABOLIC PANEL
ALT: 14 U/L (ref 0–35)
AST: 22 U/L (ref 0–37)
Albumin: 4.6 g/dL (ref 3.5–5.2)
Alkaline Phosphatase: 56 U/L (ref 39–117)
BUN: 13 mg/dL (ref 6–23)
CO2: 32 mEq/L (ref 19–32)
Calcium: 9.6 mg/dL (ref 8.4–10.5)
Chloride: 97 mEq/L (ref 96–112)
Creatinine, Ser: 0.82 mg/dL (ref 0.40–1.20)
GFR: 70.92 mL/min (ref >60.00)
Glucose, Bld: 86 mg/dL (ref 70–99)
Potassium: 4.1 mEq/L (ref 3.5–5.1)
Sodium: 137 mEq/L (ref 135–145)
Total Bilirubin: 0.4 mg/dL (ref 0.2–1.2)
Total Protein: 7.3 g/dL (ref 6.0–8.3)

## 2019-11-05 LAB — HEMOGLOBIN A1C: Hgb A1c MFr Bld: 5.7 % (ref 4.6–6.5)

## 2019-11-05 LAB — FERRITIN: Ferritin: 72.8 ng/mL (ref 10.0–291.0)

## 2019-11-05 LAB — HIV ANTIBODY (ROUTINE TESTING W REFLEX): HIV 1&2 Ab, 4th Generation: NONREACTIVE

## 2019-11-08 ENCOUNTER — Encounter: Payer: Self-pay | Admitting: Family Medicine

## 2019-11-08 LAB — CYTOLOGY - PAP
Comment: NEGATIVE
Diagnosis: NEGATIVE
High risk HPV: NEGATIVE

## 2019-11-12 MED FILL — AMPICILLIN TR 500 MG CAP: 500 | 30 days supply | Qty: 60 | Fill #1

## 2019-12-29 MED FILL — AMPICILLIN TR 500 MG CAP: 500 | 30 days supply | Qty: 60 | Fill #2

## 2020-02-02 ENCOUNTER — Ambulatory Visit: Payer: 59

## 2020-02-02 ENCOUNTER — Other Ambulatory Visit: Payer: 59

## 2020-03-16 MED FILL — AMPICILLIN TR 500 MG CAP: 500 | 30 days supply | Qty: 60 | Fill #4

## 2020-04-14 ENCOUNTER — Other Ambulatory Visit: Payer: 59

## 2020-04-14 ENCOUNTER — Ambulatory Visit
Admission: RE | Admit: 2020-04-14 | Discharge: 2020-04-14 | Disposition: A | Discharge status: Home / Self Care | Payer: 59 | Source: Ambulatory Visit | Attending: Family Medicine | Admitting: Family Medicine

## 2020-04-14 ENCOUNTER — Other Ambulatory Visit: Payer: Self-pay

## 2020-04-14 DIAGNOSIS — Z1231 Encounter for screening mammogram for malignant neoplasm of breast: Secondary | ICD-10-CM | POA: Diagnosis not present

## 2020-04-27 MED FILL — AMPICILLIN TR 500 MG CAP: 500 | 30 days supply | Qty: 60 | Fill #5

## 2020-06-01 MED FILL — CROMOLYN 4% EYE DROPS: 4 | 25 days supply | Qty: 10 | Fill #1

## 2020-06-01 MED FILL — TRIAMCINOLONE 0.1% PASTE: 0.1 | 7 days supply | Qty: 5 | Fill #1

## 2020-06-02 ENCOUNTER — Other Ambulatory Visit (HOSPITAL_COMMUNITY): Payer: Self-pay | Admitting: Dermatology

## 2020-06-02 MED FILL — CLINDAMYCIN PH 1% GEL: 1 | 30 days supply | Qty: 60 | Fill #0

## 2020-06-09 ENCOUNTER — Other Ambulatory Visit (HOSPITAL_COMMUNITY): Payer: Self-pay | Admitting: Dermatology

## 2020-06-09 MED FILL — AMPICILLIN TR 500 MG CAP: 500 | 30 days supply | Qty: 60 | Fill #0

## 2020-07-10 MED FILL — AMPICILLIN TR 500 MG CAP: 500 | 30 days supply | Qty: 60 | Fill #1

## 2020-07-26 DIAGNOSIS — M25511 Pain in right shoulder: Secondary | ICD-10-CM | POA: Diagnosis not present

## 2020-08-11 ENCOUNTER — Other Ambulatory Visit: Payer: Self-pay

## 2020-08-11 ENCOUNTER — Ambulatory Visit (INDEPENDENT_AMBULATORY_CARE_PROVIDER_SITE_OTHER): Payer: Self-pay | Admitting: Plastic Surgery

## 2020-08-11 ENCOUNTER — Encounter: Payer: Self-pay | Admitting: Plastic Surgery

## 2020-08-11 VITALS — BP 124/62 | HR 62 | Temp 97.9°F

## 2020-08-11 DIAGNOSIS — Z719 Counseling, unspecified: Secondary | ICD-10-CM

## 2020-08-11 NOTE — Progress Notes (Signed)
Botulinum Toxin and Filler Injection Procedure Note  Procedure: Cosmetic botulinum toxin and Filler administration  Pre-operative Diagnosis: Dynamic rhytides and midface volume loss  Post-operative Diagnosis: Same  Complications:  None  Brief history: The patient desires botulinum toxin injection of her forehead. I discussed with the patient this proposed procedure of botulinum toxin injections, which is customized depending on the particular needs of the patient. It is performed on facial rhytids as a temporary correction. The alternatives were discussed with the patient. The risks were addressed including bleeding, scarring, infection, damage to deeper structures, asymmetry, and chronic pain, which may occur infrequently after a procedure. The individual's choice to undergo a surgical procedure is based on the comparison of risks to potential benefits. Other risks include unsatisfactory results, brow ptosis, eyelid ptosis, allergic reaction, temporary paralysis, which should go away with time, bruising, blurring disturbances and delayed healing. Botulinum toxin injections do not arrest the aging process or produce permanent tightening of the eyelid.  Operative intervention maybe necessary to maintain the results of a blepharoplasty or botulinum toxin. The patient understands and wishes to proceed.  Procedure: The area was prepped with alcohol and dried with a clean gauze. Using a clean technique, the botulinum toxin was diluted with 1.25 cc of preservative-free normal saline which was slowly injected with an 18 gauge needle in a tuberculin syringes.  A 32 gauge needles were then used to inject the botulinum toxin. This mixture allow for an aliquot of 5 units per 0.1 cc in each injection site.    Subsequently the mixture was injected in the glabellar and forehead area with preservation of the temporal branch to the lateral eyebrow as well as into each lateral canthal area beginning from the lateral  orbital rim medial to the zygomaticus major in 3 separate areas. A total of 30 Units of botulinum toxin was used. The forehead and glabellar area was injected with care to inject intramuscular only while holding pressure on the supratrochlear vessels in each area during each injection on either side of the medial corrugators. The injection proceeded vertically superiorly to the medial 2/3 of the frontalis muscle and superior 2/3 of the lateral frontalis, again with preservation of the frontal branch.  The midface area was injected at the 3 sub-regions of the mid-face: zygomaticomalar region, anteromedial cheek region, and submalar region for a total of one syringe on each side of the face. The technique used was serial puncture with equal injections in the 3 sub-regions: the zygomaticomalar region, the anteromedial cheek, and the submalar region.  No complications were noted. Light pressure was held for 5 minutes. She was instructed explicitly in post-operative care.  Botox LOT:  [id number] [id number] EXP:  1/24  Restylane Lyft LOT: [id number] and [id number] EXP: 2022-09-29 and 2022-02-27

## 2020-08-15 MED FILL — AMPICILLIN TR 500 MG CAP: 500 | 30 days supply | Qty: 60 | Fill #2

## 2020-09-05 ENCOUNTER — Telehealth: Payer: Self-pay

## 2020-09-05 NOTE — Telephone Encounter (Signed)
Patient called to state she has swelling, redness, and a hard knot on her cheek where she had filler on 08/11/2020. She would like a call back to discuss further.

## 2020-09-05 NOTE — Telephone Encounter (Addendum)
Called the patient back regarding the message below.  Informed her that I spoke with Dr. Marla Roe and she stated that she can massage the area, but if she don't feel comfortable with massaging the area she can come in for an office visit on Friday.  Patient agreed to try the massaging, but she stated that she would like to go ahead and schedule an appointment for Friday.    Patient was scheduled for (Friday-09/08/20 @ 8:30am).  Patient verbalized understanding and agreed.//AB/CMA

## 2020-09-05 NOTE — Telephone Encounter (Signed)
Patient called to say that she had cheek filler a few weeks ago and now she has developed a hard knot under her eye.  When she called [place] this morning, it was the size of a quarter.  She said that it's extending, feels hot, and she is developing a bag under her eye.  She is getting concerned.  Please call.

## 2020-09-05 NOTE — Telephone Encounter (Signed)
Called the patient back regarding the message below.  Informed her that I spoke with Dr. Marla Roe and she stated that she can massage the area, but if she don't feel comfortable with massaging the area she can come in for an office visit on Friday.  Patient agreed to try the massaging, but she stated that she would like to go ahead and schedule an appointment for Friday.    Patient was scheduled for (Friday-09/08/20 @ 8:30am).  Patient verbalized understanding and agreed.//AB/CMA

## 2020-09-08 ENCOUNTER — Ambulatory Visit: Payer: 59 | Admitting: Plastic Surgery

## 2020-09-25 MED FILL — AMPICILLIN TR 500 MG CAP: 500 | 30 days supply | Qty: 60 | Fill #3

## 2020-10-03 DIAGNOSIS — D1801 Hemangioma of skin and subcutaneous tissue: Secondary | ICD-10-CM | POA: Diagnosis not present

## 2020-10-03 DIAGNOSIS — D2262 Melanocytic nevi of left upper limb, including shoulder: Secondary | ICD-10-CM | POA: Diagnosis not present

## 2020-10-03 DIAGNOSIS — D2272 Melanocytic nevi of left lower limb, including hip: Secondary | ICD-10-CM | POA: Diagnosis not present

## 2020-10-03 DIAGNOSIS — D225 Melanocytic nevi of trunk: Secondary | ICD-10-CM | POA: Diagnosis not present

## 2020-10-03 DIAGNOSIS — L821 Other seborrheic keratosis: Secondary | ICD-10-CM | POA: Diagnosis not present

## 2020-10-03 DIAGNOSIS — D2271 Melanocytic nevi of right lower limb, including hip: Secondary | ICD-10-CM | POA: Diagnosis not present

## 2020-10-03 DIAGNOSIS — L812 Freckles: Secondary | ICD-10-CM | POA: Diagnosis not present

## 2020-10-03 DIAGNOSIS — D2261 Melanocytic nevi of right upper limb, including shoulder: Secondary | ICD-10-CM | POA: Diagnosis not present

## 2020-10-25 MED FILL — CLINDAMYCIN PH 1% GEL: 1 | 30 days supply | Qty: 60 | Fill #1

## 2020-11-07 NOTE — Progress Notes (Addendum)
[facility] [address], [address] [place], [place] [address] [phone]  Date:  11/09/2020   Name:  Taylor Cain   DOB:  Jul 23, 1961   MRN:  [id number]  PCP:  Pearline Cables, MD    Chief Complaint: Annual Exam   History of Present Illness:  Taylor Cain is a 60 y.o. 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 Donzetta Starch She saw Dr Rennis Chris for her shoulder recently  Pap and HPV 1 year ago- negative Mammogram up-to-date Colon cancer screen up-to-date COVID-19 series done  Flu vaccine done  Shingrix- not done yet, we will start today  Most recent labs 1 year ago  She notes noted a node under her left jaw that has been present for 3-4 months Not tender She does tend to get mouth ulcers if she does not eat the right foods   Never a smoker She tries to exercise when she has time  Patient Active Problem List   Diagnosis Date Noted  . Encounter for counseling 07/06/2019  . Low back strain, initial encounter 05/23/2017  . Acne vulgaris 01/29/2013  . Iron deficiency anemia 01/29/2013  . Migraine without aura 10/15/2007  . PREMATURE VENTRICULAR CONTRACTIONS 10/15/2007  . GERD 10/15/2007    Past Medical History:  Diagnosis Date  . Anemia   . Arthritis    right shoulder  . Heart palpitations    made aware when patient was in 20's-30's; states they are very infrequent and pt. remains asymptomatic  . History of pneumonia   . Seasonal allergies     Past Surgical History:  Procedure Laterality Date  . COLONOSCOPY    . KNEE ARTHROSCOPY Right 1990, 1992   torn acl  . SHOULDER ARTHROSCOPY WITH ROTATOR CUFF REPAIR Right 01/02/2017    Procedure: RIGHT SHOULDER ARTHROSCOPY WITH REVISION OF ROTATOR CUFF REPAIR;  Surgeon: Francena Hanly, MD;  Location: [facility] OR;  Service: Orthopedics;  Laterality: Right;  . SHOULDER ARTHROSCOPY WITH ROTATOR CUFF REPAIR AND SUBACROMIAL DECOMPRESSION Right 07/25/2016   Procedure: RIGHT SHOULDER ARTHROSCOPY WITH ROTATOR CUFF REPAIR AND SUBACROMIAL DECOMPRESSION, DISTAL CLAVICLE RESECTION;  Surgeon: Francena Hanly, MD;  Location: [facility] OR;  Service: Orthopedics;  Laterality: Right;    Social History   Tobacco Use  . Smoking status: Never Smoker  . Smokeless tobacco: Never Used  Substance Use Topics  . Alcohol use: Yes    Alcohol/week: 3.0 standard drinks    Types: 3 Glasses of wine per week    Comment: per week  . Drug use: No    Family History  Problem Relation Age of Onset  . Migraines Mother   . Hypertension Mother   . Hypertension Maternal Grandmother   . Alzheimer's disease Maternal Grandmother   . Diabetes Maternal Grandfather   . Colon cancer Neg Hx     Allergies  Allergen Reactions  . Acetaminophen-Codeine Anxiety    "patien becomes antsy/anxious"    Medication list has been reviewed and updated.  Current Outpatient Medications on File Prior to Visit  Medication Sig Dispense Refill  . amoxicillin (AMOXIL) 500 MG capsule Take 500 mg by mouth 2 (two) times daily. Therapy  continuously  6  . CALCIUM PO Take 1 tablet by mouth daily.    . Cholecalciferol 50 MCG (2000 UT) CAPS Take 2,000 Units by mouth daily.     . clindamycin (CLINDAGEL) 1 % gel Apply 1 application topically daily.     . cromolyn (OPTICROM) 4 % ophthalmic solution Place 1 drop into both eyes daily.     No current facility-administered medications on file prior to visit.    Review of Systems:  As per HPI- otherwise negative.   Physical Examination: Vitals:   11/09/20 1529  BP: 104/64  Pulse: 67  Resp: 16  SpO2: 97%   Vitals:   11/09/20 1529  Weight: 145 lb (65.8 kg)  Height: 5\' 6"  (1.676 m)    Body mass index is 23.4 kg/m. Ideal Body Weight: Weight in (lb) to have BMI = 25: 154.6  GEN: no acute distress.  Slim build, looks well  HEENT: Atraumatic, Normocephalic.   Bilateral TM wnl, oropharynx normal.  PEERL,EOMI.   Ears and Nose: No external deformity. CV: RRR, No M/G/R. No JVD. No thrill. No extra heart sounds. PULM: CTA B, no wheezes, crackles, rhonchi. No retractions. No resp. distress. No accessory muscle use. ABD: S, NT, ND, +BS. No rebound. No HSM. EXTR: No c/c/e PSYCH: Normally interactive. Conversant.  No unusual LAD in the head/ neck is noted. Offered to do an [place] of area of concern- for now she declines but will let me know if she changes her mind or if area is more bothersome/ larger   Assessment and Plan: Physical exam  Iron deficiency anemia secondary to inadequate dietary iron intake - Plan: CBC  Screening for diabetes mellitus - Plan: Hemoglobin A1c, Comprehensive metabolic panel  Screening for hyperlipidemia - Plan: Lipid panel  Screening for thyroid disorder - Plan: TSH  Fatigue, unspecified type - Plan: TSH, VITAMIN D 25 Hydroxy (Vit-D Deficiency, Fractures)  Immunization due - Plan: Varicella-zoster vaccine IM (Shingrix)  Estrogen deficiency - Plan: DG Bone Density  CPE today Labs pending as above First dose of shingrix today Bone density ordered Will plan further follow- up pending labs.  This visit occurred during the SARS-CoV-2 public health emergency.  Safety protocols were in place, including screening questions prior to the visit, additional usage of staff PPE, and extensive cleaning of exam room while observing appropriate contact time as indicated for disinfecting solutions.    Signed Abbe Amsterdam, MD   Received her labs as follows 2/11- message to pt  Results for orders placed or performed in visit on 11/09/20  CBC  Result Value Ref Range   WBC 4.3 4.0 - 10.5 K/uL   RBC 3.98 3.87 - 5.11 Mil/uL   Platelets 181.0 150.0 -  400.0 K/uL   Hemoglobin 12.8 12.0 - 15.0 g/dL   HCT 30.8 65.7 - 84.6 %   MCV 93.5 78.0 - 100.0 fl   MCHC 34.4 30.0 - 36.0 g/dL   RDW 96.2 95.2 - 84.1 %  Hemoglobin A1c  Result Value Ref Range   Hgb A1c MFr Bld 5.6 4.6 - 6.5 %  Comprehensive metabolic panel  Result Value Ref Range   Sodium 135 135 - 145 mEq/L   Potassium 4.4 3.5 - 5.1 mEq/L   Chloride 96 96 - 112 mEq/L   CO2 32 19 - 32 mEq/L   Glucose, Bld 85 70 - 99 mg/dL   BUN 13 6 - 23 mg/dL   Creatinine, Ser 3.24 0.40 - 1.20 mg/dL   Total Bilirubin 0.5  0.2 - 1.2 mg/dL   Alkaline Phosphatase 45 39 - 117 U/L   AST 19 0 - 37 U/L   ALT 9 0 - 35 U/L   Total Protein 7.2 6.0 - 8.3 g/dL   Albumin 4.5 3.5 - 5.2 g/dL   GFR 16.10 >96.04 mL/min   Calcium 9.5 8.4 - 10.5 mg/dL  Lipid panel  Result Value Ref Range   Cholesterol 189 0 - 200 mg/dL   Triglycerides 54.0 0.0 - 149.0 mg/dL   HDL 98.11 >91.47 mg/dL   VLDL 82.9 0.0 - 56.2 mg/dL   LDL Cholesterol 98 0 - 99 mg/dL   Total CHOL/HDL Ratio 2    NonHDL 111.72   TSH  Result Value Ref Range   TSH 1.81 0.35 - 4.50 uIU/mL  VITAMIN D 25 Hydroxy (Vit-D Deficiency, Fractures)  Result Value Ref Range   VITD 36.52 30.00 - 100.00 ng/mL

## 2020-11-07 NOTE — Patient Instructions (Addendum)
It was great to see you again today, I will be in touch with your labs soon as possible 1st dose of shingrix today- you can have the 2nd dose in 2- 6 months, perhaps schedule on a Friday I ordered a bone density scan for you to have done at your convenience  Take care- try not to overwork yourself!     Health Maintenance, Female Adopting a healthy lifestyle and getting preventive care are important in promoting health and wellness. Ask your health care provider about:  The right schedule for you to have regular tests and exams.  Things you can do on your own to prevent diseases and keep yourself healthy. What should I know about diet, weight, and exercise? Eat a healthy diet  Eat a diet that includes plenty of vegetables, fruits, low-fat dairy products, and lean protein.  Do not eat a lot of foods that are high in solid fats, added sugars, or sodium.   Maintain a healthy weight Body mass index (BMI) is used to identify weight problems. It estimates body fat based on height and weight. Your health care provider can help determine your BMI and help you achieve or maintain a healthy weight. Get regular exercise Get regular exercise. This is one of the most important things you can do for your health. Most adults should:  Exercise for at least 150 minutes each week. The exercise should increase your heart rate and make you sweat (moderate-intensity exercise).  Do strengthening exercises at least twice a week. This is in addition to the moderate-intensity exercise.  Spend less time sitting. Even light physical activity can be beneficial. Watch cholesterol and blood lipids Have your blood tested for lipids and cholesterol at 62 years of age, then have this test every 5 years. Have your cholesterol levels checked more often if:  Your lipid or cholesterol levels are high.  You are older than 62 years of age.  You are at high risk for heart disease. What should I know about cancer  screening? Depending on your health history and family history, you may need to have cancer screening at various ages. This may include screening for:  Breast cancer.  Cervical cancer.  Colorectal cancer.  Skin cancer.  Lung cancer. What should I know about heart disease, diabetes, and high blood pressure? Blood pressure and heart disease  High blood pressure causes heart disease and increases the risk of stroke. This is more likely to develop in people who have high blood pressure readings, are of African descent, or are overweight.  Have your blood pressure checked: ? Every 3-5 years if you are 9-56 years of age. ? Every year if you are 40 years old or older. Diabetes Have regular diabetes screenings. This checks your fasting blood sugar level. Have the screening done:  Once every three years after age 55 if you are at a normal weight and have a low risk for diabetes.  More often and at a younger age if you are overweight or have a high risk for diabetes. What should I know about preventing infection? Hepatitis B If you have a higher risk for hepatitis B, you should be screened for this virus. Talk with your health care provider to find out if you are at risk for hepatitis B infection. Hepatitis C Testing is recommended for:  Everyone born from [birth year] through 1967.  Anyone with known risk factors for hepatitis C. Sexually transmitted infections (STIs)  Get screened for STIs, including gonorrhea and chlamydia, if: ?  You are sexually active and are younger than 62 years of age. ? You are older than 62 years of age and your health care provider tells you that you are at risk for this type of infection. ? Your sexual activity has changed since you were last screened, and you are at increased risk for chlamydia or gonorrhea. Ask your health care provider if you are at risk.  Ask your health care provider about whether you are at high risk for HIV. Your health care provider may  recommend a prescription medicine to help prevent HIV infection. If you choose to take medicine to prevent HIV, you should first get tested for HIV. You should then be tested every 3 months for as long as you are taking the medicine. Pregnancy  If you are about to stop having your period (premenopausal) and you may become pregnant, seek counseling before you get pregnant.  Take 400 to 800 micrograms (mcg) of folic acid every day if you become pregnant.  Ask for birth control (contraception) if you want to prevent pregnancy. Osteoporosis and menopause Osteoporosis is a disease in which the bones lose minerals and strength with aging. This can result in bone fractures. If you are 40 years old or older, or if you are at risk for osteoporosis and fractures, ask your health care provider if you should:  Be screened for bone loss.  Take a calcium or vitamin D supplement to lower your risk of fractures.  Be given hormone replacement therapy (HRT) to treat symptoms of menopause. Follow these instructions at home: Lifestyle  Do not use any products that contain nicotine or tobacco, such as cigarettes, e-cigarettes, and chewing tobacco. If you need help quitting, ask your health care provider.  Do not use street drugs.  Do not share needles.  Ask your health care provider for help if you need support or information about quitting drugs. Alcohol use  Do not drink alcohol if: ? Your health care provider tells you not to drink. ? You are pregnant, may be pregnant, or are planning to become pregnant.  If you drink alcohol: ? Limit how much you use to 0-1 drink a day. ? Limit intake if you are breastfeeding.  Be aware of how much alcohol is in your drink. In the [place] one drink equals one 12 oz bottle of beer (355 mL), one 5 oz glass of wine (148 mL), or one 1 oz glass of hard liquor (44 mL). General instructions  Schedule regular health, dental, and eye exams.  Stay current with your  vaccines.  Tell your health care provider if: ? You often feel depressed. ? You have ever been abused or do not feel safe at home. Summary  Adopting a healthy lifestyle and getting preventive care are important in promoting health and wellness.  Follow your health care provider's instructions about healthy diet, exercising, and getting tested or screened for diseases.  Follow your health care provider's instructions on monitoring your cholesterol and blood pressure. This information is not intended to replace advice given to you by your health care provider. Make sure you discuss any questions you have with your health care provider. Document Revised: 09/09/2018 Document Reviewed: 09/09/2018 Elsevier Patient Education  2021 [employer].

## 2020-11-09 ENCOUNTER — Ambulatory Visit (INDEPENDENT_AMBULATORY_CARE_PROVIDER_SITE_OTHER): Payer: 59 | Admitting: Family Medicine

## 2020-11-09 ENCOUNTER — Other Ambulatory Visit: Payer: Self-pay

## 2020-11-09 ENCOUNTER — Encounter: Payer: Self-pay | Admitting: Family Medicine

## 2020-11-09 VITALS — BP 104/64 | HR 67 | Resp 16 | Ht 66.0 in | Wt 145.0 lb

## 2020-11-09 DIAGNOSIS — Z0000 Encounter for general adult medical examination without abnormal findings: Secondary | ICD-10-CM | POA: Diagnosis not present

## 2020-11-09 DIAGNOSIS — R5383 Other fatigue: Secondary | ICD-10-CM | POA: Diagnosis not present

## 2020-11-09 DIAGNOSIS — Z131 Encounter for screening for diabetes mellitus: Secondary | ICD-10-CM

## 2020-11-09 DIAGNOSIS — Z13220 Encounter for screening for lipoid disorders: Secondary | ICD-10-CM | POA: Diagnosis not present

## 2020-11-09 DIAGNOSIS — Z23 Encounter for immunization: Secondary | ICD-10-CM

## 2020-11-09 DIAGNOSIS — D508 Other iron deficiency anemias: Secondary | ICD-10-CM | POA: Diagnosis not present

## 2020-11-09 DIAGNOSIS — E2839 Other primary ovarian failure: Secondary | ICD-10-CM

## 2020-11-09 DIAGNOSIS — Z1329 Encounter for screening for other suspected endocrine disorder: Secondary | ICD-10-CM

## 2020-11-10 ENCOUNTER — Encounter: Payer: Self-pay | Admitting: Family Medicine

## 2020-11-10 LAB — LIPID PANEL
Cholesterol: 189 mg/dL (ref 0–200)
HDL: 77.1 mg/dL
LDL Cholesterol: 98 mg/dL (ref 0–99)
NonHDL: 111.72
Total CHOL/HDL Ratio: 2
Triglycerides: 70 mg/dL (ref 0.0–149.0)
VLDL: 14 mg/dL (ref 0.0–40.0)

## 2020-11-10 LAB — COMPREHENSIVE METABOLIC PANEL WITH GFR
ALT: 9 U/L (ref 0–35)
AST: 19 U/L (ref 0–37)
Albumin: 4.5 g/dL (ref 3.5–5.2)
Alkaline Phosphatase: 45 U/L (ref 39–117)
BUN: 13 mg/dL (ref 6–23)
CO2: 32 meq/L (ref 19–32)
Calcium: 9.5 mg/dL (ref 8.4–10.5)
Chloride: 96 meq/L (ref 96–112)
Creatinine, Ser: 0.86 mg/dL (ref 0.40–1.20)
GFR: 72.7 mL/min
Glucose, Bld: 85 mg/dL (ref 70–99)
Potassium: 4.4 meq/L (ref 3.5–5.1)
Sodium: 135 meq/L (ref 135–145)
Total Bilirubin: 0.5 mg/dL (ref 0.2–1.2)
Total Protein: 7.2 g/dL (ref 6.0–8.3)

## 2020-11-10 LAB — CBC
HCT: 37.2 % (ref 36.0–46.0)
Hemoglobin: 12.8 g/dL (ref 12.0–15.0)
MCHC: 34.4 g/dL (ref 30.0–36.0)
MCV: 93.5 fl (ref 78.0–100.0)
Platelets: 181 K/uL (ref 150.0–400.0)
RBC: 3.98 Mil/uL (ref 3.87–5.11)
RDW: 13.5 % (ref 11.5–15.5)
WBC: 4.3 K/uL (ref 4.0–10.5)

## 2020-11-10 LAB — VITAMIN D 25 HYDROXY (VIT D DEFICIENCY, FRACTURES): VITD: 36.52 ng/mL (ref 30.00–100.00)

## 2020-11-10 LAB — HEMOGLOBIN A1C: Hgb A1c MFr Bld: 5.6 % (ref 4.6–6.5)

## 2020-11-10 LAB — TSH: TSH: 1.81 u[IU]/mL (ref 0.35–4.50)

## 2020-11-16 ENCOUNTER — Other Ambulatory Visit (HOSPITAL_COMMUNITY): Payer: Self-pay | Admitting: Dermatology

## 2020-11-16 MED FILL — AMPICILLIN TR 500 MG CAP: 500 | 30 days supply | Qty: 60 | Fill #0

## 2020-12-18 ENCOUNTER — Other Ambulatory Visit (HOSPITAL_BASED_OUTPATIENT_CLINIC_OR_DEPARTMENT_OTHER): Payer: Self-pay

## 2021-01-12 ENCOUNTER — Other Ambulatory Visit (HOSPITAL_COMMUNITY): Payer: Self-pay

## 2021-01-12 MED FILL — Ampicillin Cap 500 MG: ORAL | 30 days supply | Qty: 60 | Fill #0 | Status: AC

## 2021-01-17 ENCOUNTER — Other Ambulatory Visit (HOSPITAL_COMMUNITY): Payer: Self-pay

## 2021-01-19 ENCOUNTER — Inpatient Hospital Stay (HOSPITAL_BASED_OUTPATIENT_CLINIC_OR_DEPARTMENT_OTHER): Admission: RE | Admit: 2021-01-19 | Payer: 59 | Source: Ambulatory Visit

## 2021-01-19 ENCOUNTER — Ambulatory Visit: Payer: 59

## 2021-01-23 DIAGNOSIS — H52223 Regular astigmatism, bilateral: Secondary | ICD-10-CM | POA: Diagnosis not present

## 2021-01-23 DIAGNOSIS — H5212 Myopia, left eye: Secondary | ICD-10-CM | POA: Diagnosis not present

## 2021-01-23 DIAGNOSIS — H524 Presbyopia: Secondary | ICD-10-CM | POA: Diagnosis not present

## 2021-01-26 ENCOUNTER — Ambulatory Visit (INDEPENDENT_AMBULATORY_CARE_PROVIDER_SITE_OTHER): Payer: 59

## 2021-01-26 ENCOUNTER — Other Ambulatory Visit: Payer: Self-pay

## 2021-01-26 DIAGNOSIS — Z23 Encounter for immunization: Secondary | ICD-10-CM

## 2021-01-26 NOTE — Progress Notes (Signed)
Patient here today for second shingrix vaccine. 0.5mL given in left deltoid IM. Patient tolerated well. [facility] updated.  

## 2021-02-13 ENCOUNTER — Ambulatory Visit (INDEPENDENT_AMBULATORY_CARE_PROVIDER_SITE_OTHER): Payer: 59 | Admitting: Plastic Surgery

## 2021-02-13 ENCOUNTER — Other Ambulatory Visit: Payer: Self-pay

## 2021-02-13 DIAGNOSIS — Z719 Counseling, unspecified: Secondary | ICD-10-CM

## 2021-02-13 NOTE — Progress Notes (Signed)

## 2021-02-14 ENCOUNTER — Other Ambulatory Visit: Payer: Self-pay

## 2021-02-14 ENCOUNTER — Other Ambulatory Visit (HOSPITAL_COMMUNITY): Payer: Self-pay

## 2021-02-14 MED ORDER — AMPICILLIN 500 MG PO CAPS
ORAL_CAPSULE | ORAL | 5 refills | Status: DC
  Filled 2021-02-14: qty 60, 30d supply, fill #0
  Filled 2021-05-04: qty 60, 30d supply, fill #1
  Filled 2021-07-19: qty 60, 30d supply, fill #2
  Filled 2021-09-17: qty 60, 30d supply, fill #3
  Filled 2021-10-30: qty 60, 30d supply, fill #4
  Filled 2022-01-10: qty 60, 30d supply, fill #5

## 2021-02-19 ENCOUNTER — Other Ambulatory Visit (HOSPITAL_COMMUNITY): Payer: Self-pay

## 2021-02-20 ENCOUNTER — Telehealth: Payer: 59 | Admitting: Family

## 2021-02-20 DIAGNOSIS — U071 COVID-19: Secondary | ICD-10-CM

## 2021-02-20 MED ORDER — BENZONATATE 100 MG PO CAPS: 100.0000 mg | ORAL_CAPSULE | Freq: Three times a day (TID) | ORAL | 0 refills | Status: DC | PRN

## 2021-02-20 MED ORDER — FLUTICASONE PROPIONATE 50 MCG/ACT NA SUSP: 2.0000 | Freq: Every day | NASAL | 6 refills | Status: DC

## 2021-02-20 MED ORDER — ALBUTEROL SULFATE HFA 108 (90 BASE) MCG/ACT IN AERS: 2.0000 | INHALATION_SPRAY | Freq: Four times a day (QID) | RESPIRATORY_TRACT | 0 refills | Status: DC | PRN

## 2021-02-20 NOTE — Progress Notes (Signed)
E-Visit for Positive Covid Test Result We are sorry you are not feeling well. We are here to help!  You have tested positive for COVID-19, meaning that you were infected with the novel coronavirus and could give the virus to others.  It is vitally important that you stay home so you do not spread it to others.      Please continue isolation at home, for at least 10 days since the start of your symptoms and until you have had 24 hours with no fever (without taking a fever reducer) and with improving of symptoms.  If you have no symptoms but tested positive (or all symptoms resolve after 5 days and you have no fever) you can leave your house but continue to wear a mask around others for an additional 5 days. If you have a fever,continue to stay home until you have had 24 hours of no fever. Most cases improve 5-10 days from onset but we have seen a small number of patients who have gotten worse after the 10 days.  Please be sure to watch for worsening symptoms and remain taking the proper precautions.   Go to the nearest hospital ED for assessment if fever/cough/breathlessness are severe or illness seems like a threat to life.    The following symptoms may appear 2-14 days after exposure: . Fever . Cough . Shortness of breath or difficulty breathing . Chills . Repeated shaking with chills . Muscle pain . Headache . Sore throat . New loss of taste or smell . Fatigue . Congestion or runny nose . Nausea or vomiting . Diarrhea  You have been enrolled in [place] for COVID-19. Daily you will receive a questionnaire within the [place] website. Our COVID-19 response team will be monitoring your responses daily.  You can use medication such as prescription cough medication called Tessalon Perles 100 mg. You may take 1-2 capsules every 8 hours as needed for cough,  prescription inhaler called Albuterol MDI 90 mcg /actuation 2 puffs every 4 hours as needed for shortness of breath,  wheezing, cough and prescription for Fluticasone nasal spray 2 sprays in each nostril one time per day  You may also take acetaminophen (Tylenol) as needed for fever.  HOME CARE: . Only take medications as instructed by your medical team. . Drink plenty of fluids and get plenty of rest. . A steam or ultrasonic humidifier can help if you have congestion.   GET HELP RIGHT AWAY IF YOU HAVE EMERGENCY WARNING SIGNS.  Call 911 or proceed to your closest emergency facility if: . You develop worsening high fever. . Trouble breathing . Bluish lips or face . Persistent pain or pressure in the chest . New confusion . Inability to wake or stay awake . You cough up blood. . Your symptoms become more severe . Inability to hold down food or fluids  This list is not all possible symptoms. Contact your medical provider for any symptoms that are severe or concerning to you.    Your e-visit answers were reviewed by a board certified advanced clinical practitioner to complete your personal care plan.  Depending on the condition, your plan could have included both over the counter or prescription medications.  If there is a problem please reply once you have received a response from your provider.  Your safety is important to [place].  If you have drug allergies check your prescription carefully.    You can use MyChart to ask questions about today's visit, request a  non-urgent call back, or ask for a work or school excuse for 24 hours related to this e-Visit. If it has been greater than 24 hours you will need to follow up with your provider, or enter a new e-Visit to address those concerns. You will get an e-mail in the next two days asking about your experience.  I hope that your e-visit has been valuable and will speed your recovery. Thank you for using e-visits.      Approximately 5 minutes was spent documenting and reviewing patient's chart.

## 2021-03-19 ENCOUNTER — Other Ambulatory Visit (HOSPITAL_BASED_OUTPATIENT_CLINIC_OR_DEPARTMENT_OTHER): Payer: 59

## 2021-04-25 ENCOUNTER — Other Ambulatory Visit: Payer: Self-pay | Admitting: Family Medicine

## 2021-04-25 DIAGNOSIS — Z1231 Encounter for screening mammogram for malignant neoplasm of breast: Secondary | ICD-10-CM

## 2021-04-30 ENCOUNTER — Ambulatory Visit: Payer: 59

## 2021-05-02 ENCOUNTER — Other Ambulatory Visit (HOSPITAL_BASED_OUTPATIENT_CLINIC_OR_DEPARTMENT_OTHER): Payer: Self-pay

## 2021-05-02 ENCOUNTER — Ambulatory Visit: Payer: 59

## 2021-05-02 ENCOUNTER — Other Ambulatory Visit: Payer: Self-pay

## 2021-05-02 ENCOUNTER — Ambulatory Visit: Payer: 59 | Attending: Internal Medicine

## 2021-05-02 DIAGNOSIS — Z23 Encounter for immunization: Secondary | ICD-10-CM

## 2021-05-02 MED ORDER — COVID-19 MRNA VACC (MODERNA) 100 MCG/0.5ML IM SUSP
INTRAMUSCULAR | 0 refills | Status: DC
  Filled 2021-05-02: qty 0.25, 1d supply, fill #0

## 2021-05-02 NOTE — Progress Notes (Signed)
   Covid-19 Vaccination Clinic  Name:  Saria Christner    MRN: [id number] DOB: [birth year]  05/02/2021  Ms. Elcock was observed post Covid-19 immunization for 15 minutes without incident. She was provided with Vaccine Information Sheet and instruction to access the V-Safe system.   Ms. Suri was instructed to call 911 with any severe reactions post vaccine: Difficulty breathing  Swelling of face and throat  A fast heartbeat  A bad rash all over body  Dizziness and weakness   Immunizations Administered     Name Date Dose VIS Date Route   Moderna Covid-19 Booster Vaccine 05/02/2021  1:06 PM 0.25 mL 07/19/2020 Intramuscular   Manufacturer: Moderna   Lot: [id number]   [id number]

## 2021-05-03 ENCOUNTER — Other Ambulatory Visit (HOSPITAL_BASED_OUTPATIENT_CLINIC_OR_DEPARTMENT_OTHER): Payer: Self-pay

## 2021-05-04 ENCOUNTER — Other Ambulatory Visit: Payer: Self-pay

## 2021-05-04 ENCOUNTER — Ambulatory Visit (INDEPENDENT_AMBULATORY_CARE_PROVIDER_SITE_OTHER): Payer: 59 | Admitting: Orthopaedic Surgery

## 2021-05-04 ENCOUNTER — Ambulatory Visit (INDEPENDENT_AMBULATORY_CARE_PROVIDER_SITE_OTHER): Payer: 59

## 2021-05-04 ENCOUNTER — Other Ambulatory Visit (HOSPITAL_COMMUNITY): Payer: Self-pay

## 2021-05-04 VITALS — Ht 66.0 in | Wt 140.0 lb

## 2021-05-04 DIAGNOSIS — M25561 Pain in right knee: Secondary | ICD-10-CM | POA: Diagnosis not present

## 2021-05-04 DIAGNOSIS — G8929 Other chronic pain: Secondary | ICD-10-CM

## 2021-05-04 MED ORDER — METHYLPREDNISOLONE ACETATE 40 MG/ML IJ SUSP
40.0000 mg | INTRAMUSCULAR | Status: AC | PRN
  Administered 2021-05-04: 40 mg via INTRA_ARTICULAR

## 2021-05-04 MED ORDER — BUPIVACAINE HCL 0.5 % IJ SOLN
2.0000 mL | INTRAMUSCULAR | Status: AC | PRN
  Administered 2021-05-04: 2 mL via INTRA_ARTICULAR

## 2021-05-04 MED ORDER — LIDOCAINE HCL 1 % IJ SOLN
2.0000 mL | INTRAMUSCULAR | Status: AC | PRN
  Administered 2021-05-04: 2 mL

## 2021-05-04 NOTE — Progress Notes (Signed)
Office Visit Note   Patient: Taylor Cain           Date of Birth: 19-Apr-1961           MRN: [id number] Visit Date: 05/04/2021              Requested by: Darreld Mclean, MD [address] [place],  [place] [address] PCP: Darreld Mclean, MD   Assessment & Plan: Visit Diagnoses:  1. Chronic pain of right knee     Plan: Based on findings impression is medial meniscus tear.  Does not sound like she is having mechanical symptoms but she is having constant pain.  Since it has been 7 months of continued pain despite conservative treatment she did agree to undergo cortisone injection today which she tolerated well.  I have asked her to back off on activity for the next 4 to 6 weeks and then if she feels improvement she will gradually increase activity after that.  If she does not feel any improvement then she will let me know so that we can obtain an MRI to evaluate for structural abnormalities.  Follow-Up Instructions: Return if symptoms worsen or fail to improve.   Orders:  Orders Placed This Encounter  Procedures   XR KNEE 3 VIEW RIGHT   No orders of the defined types were placed in this encounter.     Procedures: Large Joint Inj: R knee on 05/04/2021 3:54 PM Indications: pain Details: 22 G needle  Arthrogram: No  Medications: 40 mg methylPREDNISolone acetate 40 MG/ML; 2 mL lidocaine 1 %; 2 mL bupivacaine 0.5 % Consent was given by the patient. Patient was prepped and draped in the usual sterile fashion.      Clinical Data: No additional findings.   Subjective: Chief Complaint  Patient presents with   Right Knee - Pain    Taylor Cain is a very pleasant 60 year old female referral from PCP Dr. Jackie Plum for chronic right knee pain of 7 months.  This acutely began after a workout with a trainer.  She did not experience any injuries during the workout.  Since then she has felt a pinching pain on the medial side of her knee and on the back.  She has  swelling occasionally.  The pain is worse with prolonged standing and wakes her up at night.  She has increased pain with deep flexion and feels difficulty and discomfort with full extension.  She feels some anterior medial knee pain.  Denies any clicking or catching or giving way.  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   Review of Systems  Constitutional: Negative.   HENT: Negative.    Eyes: Negative.   Respiratory: Negative.    Cardiovascular: Negative.   Endocrine: Negative.   Musculoskeletal: Negative.   Neurological: Negative.   Hematological: Negative.   Psychiatric/Behavioral: Negative.    All other systems reviewed and are negative.   Objective: Vital Signs: Ht '5\' 6"'$  (1.676 m)   Wt 140 lb (63.5 kg)   LMP 01/28/2014   BMI 22.60 kg/m   Physical Exam Vitals and nursing note reviewed.  Constitutional:      Appearance: She is well-developed.  HENT:     Head: Normocephalic and atraumatic.  Pulmonary:     Effort: Pulmonary effort is normal.  Abdominal:     Palpations: Abdomen is soft.  Musculoskeletal:     Cervical back: Neck supple.  Skin:    General: Skin is warm.  Capillary Refill: Capillary refill takes less than 2 seconds.  Neurological:     Mental Status: She is alert and oriented to person, place, and time.  Psychiatric:        Behavior: Behavior normal.        Thought Content: Thought content normal.        Judgment: Judgment normal.    Ortho Exam Examination of the right knee shows trace effusion.  Medial joint line tenderness.  Negative McMurray.  She has increased pain to the medial side of the knee with flexion of the knee past 120degrees.  Collaterals and cruciates are stable.  No bony tenderness.  Normal patellar tracking.  No patellofemoral crepitus.  Specialty Comments:  No specialty comments available.  Imaging: XR KNEE 3 VIEW RIGHT  Result Date: 05/04/2021 Minimal periarticular spurring consistent with early arthritis.    PMFS  History: Patient Active Problem List   Diagnosis Date Noted   Encounter for counseling 07/06/2019   Low back strain, initial encounter 05/23/2017   Acne vulgaris 01/29/2013   Iron deficiency anemia 01/29/2013   Migraine without aura 10/15/2007   PREMATURE VENTRICULAR CONTRACTIONS 10/15/2007   GERD 10/15/2007   Past Medical History:  Diagnosis Date   Anemia    Arthritis    right shoulder   Heart palpitations    made aware when patient was in 20's-30's; states they are very infrequent and pt. remains asymptomatic   History of pneumonia    Seasonal allergies     Family History  Problem Relation Age of Onset   Migraines Mother    Hypertension Mother    Hypertension Maternal Grandmother    Alzheimer's disease Maternal Grandmother    Diabetes Maternal Grandfather    Colon cancer Neg Hx     Past Surgical History:  Procedure Laterality Date   COLONOSCOPY     KNEE ARTHROSCOPY Right 1990, 1992   torn acl   SHOULDER ARTHROSCOPY WITH ROTATOR CUFF REPAIR Right 01/02/2017   Procedure: RIGHT SHOULDER ARTHROSCOPY WITH REVISION OF ROTATOR CUFF REPAIR;  Surgeon: Justice Britain, MD;  Location: [facility];  Service: Orthopedics;  Laterality: Right;   SHOULDER ARTHROSCOPY WITH ROTATOR CUFF REPAIR AND SUBACROMIAL DECOMPRESSION Right 07/25/2016   Procedure: RIGHT SHOULDER ARTHROSCOPY WITH ROTATOR CUFF REPAIR AND SUBACROMIAL DECOMPRESSION, DISTAL CLAVICLE RESECTION;  Surgeon: Justice Britain, MD;  Location: [facility];  Service: Orthopedics;  Laterality: Right;   Social History   Occupational History   Occupation: DIRECTOR    Employer: [employer]  Tobacco Use   Smoking status: Never   Smokeless tobacco: Never  Substance and Sexual Activity   Alcohol use: Yes    Alcohol/week: 3.0 standard drinks    Types: 3 Glasses of wine per week    Comment: per week   Drug use: No   Sexual activity: Not on file

## 2021-05-07 ENCOUNTER — Other Ambulatory Visit (HOSPITAL_COMMUNITY): Payer: Self-pay

## 2021-05-07 MED ORDER — CLINDAMYCIN PHOSPHATE 1 % EX GEL
CUTANEOUS | 2 refills | Status: DC
  Filled 2021-05-07: qty 60, 30d supply, fill #0
  Filled 2021-07-19: qty 60, 30d supply, fill #1
  Filled 2021-09-17: qty 60, 30d supply, fill #2

## 2021-05-08 ENCOUNTER — Other Ambulatory Visit: Payer: Self-pay

## 2021-05-08 ENCOUNTER — Ambulatory Visit
Admission: RE | Admit: 2021-05-08 | Discharge: 2021-05-08 | Disposition: A | Discharge status: Home / Self Care | Payer: 59 | Source: Ambulatory Visit | Attending: Family Medicine | Admitting: Family Medicine

## 2021-05-08 ENCOUNTER — Other Ambulatory Visit (HOSPITAL_COMMUNITY): Payer: Self-pay

## 2021-05-08 DIAGNOSIS — Z1231 Encounter for screening mammogram for malignant neoplasm of breast: Secondary | ICD-10-CM | POA: Diagnosis not present

## 2021-06-29 ENCOUNTER — Other Ambulatory Visit: Payer: Self-pay

## 2021-07-19 ENCOUNTER — Other Ambulatory Visit (HOSPITAL_COMMUNITY): Payer: Self-pay

## 2021-07-28 ENCOUNTER — Encounter: Payer: Self-pay | Admitting: Orthopaedic Surgery

## 2021-07-31 ENCOUNTER — Other Ambulatory Visit: Payer: Self-pay

## 2021-07-31 DIAGNOSIS — G8929 Other chronic pain: Secondary | ICD-10-CM

## 2021-08-03 ENCOUNTER — Ambulatory Visit
Admission: RE | Admit: 2021-08-03 | Discharge: 2021-08-03 | Disposition: A | Discharge status: Home / Self Care | Payer: 59 | Source: Ambulatory Visit | Attending: Orthopaedic Surgery | Admitting: Orthopaedic Surgery

## 2021-08-03 DIAGNOSIS — S83511A Sprain of anterior cruciate ligament of right knee, initial encounter: Secondary | ICD-10-CM | POA: Diagnosis not present

## 2021-08-03 DIAGNOSIS — S83241A Other tear of medial meniscus, current injury, right knee, initial encounter: Secondary | ICD-10-CM | POA: Diagnosis not present

## 2021-08-03 DIAGNOSIS — M25461 Effusion, right knee: Secondary | ICD-10-CM | POA: Diagnosis not present

## 2021-08-03 DIAGNOSIS — G8929 Other chronic pain: Secondary | ICD-10-CM

## 2021-08-16 ENCOUNTER — Ambulatory Visit (INDEPENDENT_AMBULATORY_CARE_PROVIDER_SITE_OTHER): Payer: 59 | Admitting: Orthopaedic Surgery

## 2021-08-16 ENCOUNTER — Other Ambulatory Visit: Payer: Self-pay

## 2021-08-16 ENCOUNTER — Encounter: Payer: Self-pay | Admitting: Orthopaedic Surgery

## 2021-08-16 DIAGNOSIS — S83241A Other tear of medial meniscus, current injury, right knee, initial encounter: Secondary | ICD-10-CM

## 2021-08-16 NOTE — Progress Notes (Signed)
Office Visit Note   Patient: Taylor Cain           Date of Birth: [birth year]           MRN: [id number] Visit Date: 08/16/2021              Requested by: Darreld Mclean, MD [address] [place],  [place] [address] PCP: Darreld Mclean, MD   Assessment & Plan: Visit Diagnoses:  1. 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  Due to lack of relief from conservative management, I have recommended arthroscopic PMM vs repair.  Possible risks discussed.  She will meet with Taylor Cain to schedule surgery for 09/26/21.    Follow-Up Instructions: No follow-ups on file.   Orders:  No orders of the defined types were placed in this encounter.  No orders of the defined types were placed in this encounter.     Procedures: No procedures performed   Clinical Data: No additional findings.   Subjective: Chief Complaint  Patient presents with   Right Knee - Pain    HPI  [name] returns today to discuss MRI results.  Continues to have pain on the medial side of the knee while walking.  She's had 1 injection which gave incomplete relief.  She sustained a partial ACL tear in the 90s which left her with 30% functional ACL.    Review of Systems   Objective: Vital Signs: LMP 01/28/2014   Physical Exam  Ortho Exam  Right knee exam is unchanged  Specialty Comments:  No specialty comments available.  Imaging: No results found.   PMFS History: Patient Active Problem List   Diagnosis Date Noted   Acute medial meniscus tear of right knee 08/16/2021   Encounter for counseling 07/06/2019   Low back strain, initial encounter 05/23/2017   Acne vulgaris 01/29/2013   Iron deficiency anemia 01/29/2013   Migraine without aura 10/15/2007   PREMATURE VENTRICULAR CONTRACTIONS 10/15/2007   GERD 10/15/2007   Past Medical History:  Diagnosis Date    Anemia    Arthritis    right shoulder   Heart palpitations    made aware when patient was in 20's-30's; states they are very infrequent and pt. remains asymptomatic   History of pneumonia    Seasonal allergies     Family History  Problem Relation Age of Onset   Migraines Mother    Hypertension Mother    Hypertension Maternal Grandmother    Alzheimer's disease Maternal Grandmother    Diabetes Maternal Grandfather    Colon cancer Neg Hx     Past Surgical History:  Procedure Laterality Date   COLONOSCOPY     KNEE ARTHROSCOPY Right 1990, 1992   torn acl   SHOULDER ARTHROSCOPY WITH ROTATOR CUFF REPAIR Right 01/02/2017   Procedure: RIGHT SHOULDER ARTHROSCOPY WITH REVISION OF ROTATOR CUFF REPAIR;  Surgeon: Justice Britain, MD;  Location: [facility];  Service: Orthopedics;  Laterality: Right;   SHOULDER ARTHROSCOPY WITH ROTATOR CUFF REPAIR AND SUBACROMIAL DECOMPRESSION Right 07/25/2016   Procedure: RIGHT SHOULDER ARTHROSCOPY WITH ROTATOR CUFF REPAIR AND SUBACROMIAL DECOMPRESSION, DISTAL CLAVICLE RESECTION;  Surgeon: Justice Britain, MD;  Location: [facility];  Service: Orthopedics;  Laterality: Right;   Social History   Occupational History   Occupation: DIRECTOR    Employer: [employer]  Tobacco Use   Smoking status: Never  Smokeless tobacco: Never  Substance and Sexual Activity   Alcohol use: Yes    Alcohol/week: 3.0 standard drinks    Types: 3 Glasses of wine per week    Comment: per week   Drug use: No   Sexual activity: Not on file

## 2021-08-31 ENCOUNTER — Telehealth: Payer: Self-pay | Admitting: Orthopaedic Surgery

## 2021-08-31 NOTE — Telephone Encounter (Signed)
[employer] forms received. To [employer]. 

## 2021-09-07 ENCOUNTER — Other Ambulatory Visit: Payer: Self-pay

## 2021-09-07 ENCOUNTER — Ambulatory Visit (INDEPENDENT_AMBULATORY_CARE_PROVIDER_SITE_OTHER): Payer: Self-pay | Admitting: Plastic Surgery

## 2021-09-07 ENCOUNTER — Encounter: Payer: Self-pay | Admitting: Plastic Surgery

## 2021-09-07 DIAGNOSIS — Z719 Counseling, unspecified: Secondary | ICD-10-CM

## 2021-09-07 NOTE — Progress Notes (Signed)

## 2021-09-14 ENCOUNTER — Encounter (HOSPITAL_BASED_OUTPATIENT_CLINIC_OR_DEPARTMENT_OTHER): Payer: Self-pay | Admitting: Orthopaedic Surgery

## 2021-09-14 ENCOUNTER — Other Ambulatory Visit: Payer: Self-pay

## 2021-09-17 ENCOUNTER — Other Ambulatory Visit (HOSPITAL_COMMUNITY): Payer: Self-pay

## 2021-09-18 ENCOUNTER — Telehealth: Payer: Self-pay | Admitting: Orthopaedic Surgery

## 2021-09-18 NOTE — Telephone Encounter (Signed)
Auth & $25 check received via mail. To [employer].

## 2021-09-26 ENCOUNTER — Ambulatory Visit (HOSPITAL_BASED_OUTPATIENT_CLINIC_OR_DEPARTMENT_OTHER): Payer: 59 | Admitting: Anesthesiology

## 2021-09-26 ENCOUNTER — Other Ambulatory Visit: Payer: Self-pay

## 2021-09-26 ENCOUNTER — Encounter (HOSPITAL_BASED_OUTPATIENT_CLINIC_OR_DEPARTMENT_OTHER): Payer: Self-pay | Admitting: Orthopaedic Surgery

## 2021-09-26 ENCOUNTER — Other Ambulatory Visit (HOSPITAL_COMMUNITY): Payer: Self-pay

## 2021-09-26 ENCOUNTER — Encounter (HOSPITAL_BASED_OUTPATIENT_CLINIC_OR_DEPARTMENT_OTHER)
Admission: RE | Disposition: A | Discharge status: Home / Self Care | Payer: Self-pay | Source: Ambulatory Visit | Attending: Orthopaedic Surgery

## 2021-09-26 ENCOUNTER — Encounter: Payer: Self-pay | Admitting: Orthopaedic Surgery

## 2021-09-26 ENCOUNTER — Ambulatory Visit (HOSPITAL_BASED_OUTPATIENT_CLINIC_OR_DEPARTMENT_OTHER)
Admission: RE | Admit: 2021-09-26 | Discharge: 2021-09-26 | Disposition: A | Discharge status: Home / Self Care | Payer: 59 | Source: Ambulatory Visit | Attending: Orthopaedic Surgery | Admitting: Orthopaedic Surgery

## 2021-09-26 DIAGNOSIS — S83241A Other tear of medial meniscus, current injury, right knee, initial encounter: Secondary | ICD-10-CM | POA: Insufficient documentation

## 2021-09-26 DIAGNOSIS — X58XXXA Exposure to other specified factors, initial encounter: Secondary | ICD-10-CM | POA: Insufficient documentation

## 2021-09-26 DIAGNOSIS — K219 Gastro-esophageal reflux disease without esophagitis: Secondary | ICD-10-CM | POA: Diagnosis not present

## 2021-09-26 HISTORY — PX: KNEE ARTHROSCOPY WITH MENISCAL REPAIR: SHX5653

## 2021-09-26 SURGERY — ARTHROSCOPY, KNEE, WITH MENISCUS REPAIR
Anesthesia: General | Site: Knee | Laterality: Right

## 2021-09-26 MED ORDER — CEFAZOLIN SODIUM-DEXTROSE 2-4 GM/100ML-% IV SOLN
INTRAVENOUS | Status: AC
  Filled 2021-09-26: qty 100

## 2021-09-26 MED ORDER — HYDROMORPHONE HCL 1 MG/ML IJ SOLN
0.2500 mg | INTRAMUSCULAR | Status: DC | PRN
  Administered 2021-09-26 (×2): 0.5 mg via INTRAVENOUS

## 2021-09-26 MED ORDER — DEXAMETHASONE SODIUM PHOSPHATE 4 MG/ML IJ SOLN
INTRAMUSCULAR | Status: DC | PRN
  Administered 2021-09-26: 8 mg via INTRAVENOUS

## 2021-09-26 MED ORDER — PROMETHAZINE HCL 25 MG/ML IJ SOLN: 6.2500 mg | INTRAMUSCULAR | Status: DC | PRN

## 2021-09-26 MED ORDER — HYDROMORPHONE HCL 1 MG/ML IJ SOLN
INTRAMUSCULAR | Status: AC
  Filled 2021-09-26: qty 0.5

## 2021-09-26 MED ORDER — ONDANSETRON HCL 4 MG/2ML IJ SOLN
INTRAMUSCULAR | Status: AC
  Filled 2021-09-26: qty 2

## 2021-09-26 MED ORDER — SODIUM CHLORIDE 0.9 % IR SOLN
Status: DC | PRN
  Administered 2021-09-26: 3000 mL

## 2021-09-26 MED ORDER — LIDOCAINE 2% (20 MG/ML) 5 ML SYRINGE
INTRAMUSCULAR | Status: AC
  Filled 2021-09-26: qty 5

## 2021-09-26 MED ORDER — PROPOFOL 10 MG/ML IV BOLUS
INTRAVENOUS | Status: AC
  Filled 2021-09-26: qty 20

## 2021-09-26 MED ORDER — PROPOFOL 10 MG/ML IV BOLUS
INTRAVENOUS | Status: DC | PRN
  Administered 2021-09-26: 200 mg via INTRAVENOUS

## 2021-09-26 MED ORDER — CEFAZOLIN SODIUM-DEXTROSE 2-4 GM/100ML-% IV SOLN
2.0000 g | INTRAVENOUS | Status: AC
  Administered 2021-09-26: 13:00:00 2 g via INTRAVENOUS

## 2021-09-26 MED ORDER — HYDROCODONE-ACETAMINOPHEN 5-325 MG PO TABS
1.0000 | ORAL_TABLET | Freq: Four times a day (QID) | ORAL | 0 refills | Status: DC | PRN
  Filled 2021-09-26: qty 20, 5d supply, fill #0

## 2021-09-26 MED ORDER — FENTANYL CITRATE (PF) 100 MCG/2ML IJ SOLN
INTRAMUSCULAR | Status: AC
  Filled 2021-09-26: qty 2

## 2021-09-26 MED ORDER — LIDOCAINE 2% (20 MG/ML) 5 ML SYRINGE
INTRAMUSCULAR | Status: DC | PRN
  Administered 2021-09-26: 60 mg via INTRAVENOUS

## 2021-09-26 MED ORDER — MIDAZOLAM HCL 5 MG/5ML IJ SOLN
INTRAMUSCULAR | Status: DC | PRN
Start: 2021-09-26 — End: 2021-09-26
  Administered 2021-09-26: 2 mg via INTRAVENOUS

## 2021-09-26 MED ORDER — FENTANYL CITRATE (PF) 100 MCG/2ML IJ SOLN
INTRAMUSCULAR | Status: DC | PRN
  Administered 2021-09-26 (×2): 25 ug via INTRAVENOUS
  Administered 2021-09-26: 50 ug via INTRAVENOUS

## 2021-09-26 MED ORDER — OXYCODONE HCL 5 MG PO TABS
ORAL_TABLET | ORAL | Status: AC
  Filled 2021-09-26: qty 1

## 2021-09-26 MED ORDER — BUPIVACAINE HCL (PF) 0.25 % IJ SOLN
INTRAMUSCULAR | Status: DC | PRN
  Administered 2021-09-26: 20 mL

## 2021-09-26 MED ORDER — MIDAZOLAM HCL 2 MG/2ML IJ SOLN
INTRAMUSCULAR | Status: AC
  Filled 2021-09-26: qty 2

## 2021-09-26 MED ORDER — LACTATED RINGERS IV SOLN: INTRAVENOUS | Status: DC

## 2021-09-26 MED ORDER — ONDANSETRON HCL 4 MG/2ML IJ SOLN
INTRAMUSCULAR | Status: DC | PRN
  Administered 2021-09-26: 4 mg via INTRAVENOUS

## 2021-09-26 MED ORDER — AMISULPRIDE (ANTIEMETIC) 5 MG/2ML IV SOLN: 10.0000 mg | Freq: Once | INTRAVENOUS | Status: DC | PRN

## 2021-09-26 MED ORDER — OXYCODONE HCL 5 MG PO TABS
5.0000 mg | ORAL_TABLET | Freq: Once | ORAL | Status: AC | PRN
  Administered 2021-09-26: 16:00:00 5 mg via ORAL

## 2021-09-26 MED ORDER — MEPERIDINE HCL 25 MG/ML IJ SOLN: 6.2500 mg | INTRAMUSCULAR | Status: DC | PRN

## 2021-09-26 MED ORDER — OXYCODONE HCL 5 MG/5ML PO SOLN: 5.0000 mg | Freq: Once | ORAL | Status: AC | PRN

## 2021-09-26 SURGICAL SUPPLY — 38 items
BANDAGE ESMARK 6X9 LF (GAUZE/BANDAGES/DRESSINGS) IMPLANT
BLADE EXCALIBUR 4.0MM X 13CM (MISCELLANEOUS)
BLADE EXCALIBUR 4.0X13 (MISCELLANEOUS) IMPLANT
BLADE SHAVER TORPEDO 4X13 (MISCELLANEOUS) ×2 IMPLANT
BNDG CMPR 9X6 STRL LF SNTH (GAUZE/BANDAGES/DRESSINGS)
BNDG ELASTIC 6X5.8 VLCR STR LF (GAUZE/BANDAGES/DRESSINGS) ×8 IMPLANT
BNDG ESMARK 6X9 LF (GAUZE/BANDAGES/DRESSINGS)
COOLER ICEMAN CLASSIC (MISCELLANEOUS) ×4 IMPLANT
CUFF TOURN SGL QUICK 34 (TOURNIQUET CUFF) ×3
CUFF TRNQT CYL 34X4.125X (TOURNIQUET CUFF) ×2 IMPLANT
DISSECTOR 3.5MM X 13CM CVD (MISCELLANEOUS) ×2 IMPLANT
DRAPE ARTHROSCOPY W/POUCH 90 (DRAPES) ×4 IMPLANT
DRAPE IMP U-DRAPE 54X76 (DRAPES) ×4 IMPLANT
DRAPE U-SHAPE 47X51 STRL (DRAPES) ×4 IMPLANT
DURAPREP 26ML APPLICATOR (WOUND CARE) ×4 IMPLANT
GAUZE 4X4 16PLY ~~LOC~~+RFID DBL (SPONGE) ×2 IMPLANT
GAUZE SPONGE 4X4 12PLY STRL (GAUZE/BANDAGES/DRESSINGS) ×4 IMPLANT
GAUZE XEROFORM 1X8 LF (GAUZE/BANDAGES/DRESSINGS) ×4 IMPLANT
GLOVE SURG NEOP MICRO LF SZ7.5 (GLOVE) ×2 IMPLANT
GLOVE SURG POLYISO LF SZ7 (GLOVE) ×2 IMPLANT
GLOVE SURG SYN 7.5  E (GLOVE) ×3
GLOVE SURG SYN 7.5 E (GLOVE) ×1 IMPLANT
GLOVE SURG SYN 7.5 PF PI (GLOVE) ×2 IMPLANT
GLOVE SURG UNDER POLY LF SZ7 (GLOVE) ×10 IMPLANT
GLOVE SURG UNDER POLY LF SZ7.5 (GLOVE) ×4 IMPLANT
GOWN STRL REIN XL XLG (GOWN DISPOSABLE) ×6 IMPLANT
GOWN STRL REUS W/ TWL LRG LVL3 (GOWN DISPOSABLE) ×2 IMPLANT
GOWN STRL REUS W/ TWL XL LVL3 (GOWN DISPOSABLE) ×2 IMPLANT
GOWN STRL REUS W/TWL LRG LVL3 (GOWN DISPOSABLE) ×3
GOWN STRL REUS W/TWL XL LVL3 (GOWN DISPOSABLE)
MANIFOLD NEPTUNE II (INSTRUMENTS) ×4 IMPLANT
PACK ARTHROSCOPY DSU (CUSTOM PROCEDURE TRAY) ×4 IMPLANT
PACK BASIN DAY SURGERY FS (CUSTOM PROCEDURE TRAY) ×4 IMPLANT
PAD COLD SHLDR WRAP-ON (PAD) ×4 IMPLANT
SHEET MEDIUM DRAPE 40X70 STRL (DRAPES) ×4 IMPLANT
SUT ETHILON 3 0 PS 1 (SUTURE) ×4 IMPLANT
TOWEL GREEN STERILE FF (TOWEL DISPOSABLE) ×4 IMPLANT
TUBING ARTHROSCOPY IRRIG 16FT (MISCELLANEOUS) ×4 IMPLANT

## 2021-09-26 NOTE — Anesthesia Procedure Notes (Signed)
Procedure Name: LMA Insertion Date/Time: 09/26/2021 1:08 PM Performed by: Ezequiel Kayser, CRNA Pre-anesthesia Checklist: Patient identified, Emergency Drugs available, Suction available and Patient being monitored Patient Re-evaluated:Patient Re-evaluated prior to induction Oxygen Delivery Method: Circle System Utilized Preoxygenation: Pre-oxygenation with 100% oxygen Induction Type: IV induction Ventilation: Mask ventilation without difficulty LMA: LMA inserted LMA Size: 4.0 Number of attempts: 1 Airway Equipment and Method: Bite block Placement Confirmation: positive ETCO2 Tube secured with: Tape Dental Injury: Teeth and Oropharynx as per pre-operative assessment

## 2021-09-26 NOTE — Anesthesia Preprocedure Evaluation (Signed)
Anesthesia Evaluation  Patient identified by MRN, date of birth, ID band Patient awake    Reviewed: Allergy & Precautions, H&P , NPO status , Patient's Chart, lab work & pertinent test results  Airway Mallampati: II  TM Distance: >3 FB Neck ROM: full    Dental no notable dental hx.    Pulmonary neg pulmonary ROS,    Pulmonary exam normal breath sounds clear to auscultation       Cardiovascular negative cardio ROS Normal cardiovascular exam Rhythm:regular Rate:Normal     Neuro/Psych  Headaches,    GI/Hepatic GERD  ,  Endo/Other    Renal/GU      Musculoskeletal  (+) Arthritis , Osteoarthritis,    Abdominal   Peds  Hematology   Anesthesia Other Findings   Reproductive/Obstetrics                             Anesthesia Physical  Anesthesia Plan  ASA: II  Anesthesia Plan: General   Post-op Pain Management: GA combined w/ Regional for post-op pain   Induction: Intravenous  PONV Risk Score and Plan: 3 and Ondansetron, Dexamethasone, Midazolam and Treatment may vary due to age or medical condition  Airway Management Planned: LMA  Additional Equipment:   Intra-op Plan:   Post-operative Plan: Extubation in OR  Informed Consent: I have reviewed the patients History and Physical, chart, labs and discussed the procedure including the risks, benefits and alternatives for the proposed anesthesia with the patient or authorized representative who has indicated his/her understanding and acceptance.       Plan Discussed with: CRNA, Anesthesiologist and Surgeon  Anesthesia Plan Comments:         Anesthesia Quick Evaluation

## 2021-09-26 NOTE — Anesthesia Postprocedure Evaluation (Signed)
Anesthesia Post Note  Patient: ALLEEN KEHM  Procedure(s) Performed: RIGHT KNEE ARTHROSCOPY WITH PARTIAL MEDIAL MENISCECTOMY AND CHONDROPLASTY MEDIAL FEMORAL CONDYLE AND TROCHLEA (Right: Knee)     Patient location during evaluation: PACU Anesthesia Type: General Level of consciousness: awake and alert Pain management: pain level controlled Vital Signs Assessment: post-procedure vital signs reviewed and stable Respiratory status: spontaneous breathing, nonlabored ventilation and respiratory function stable Cardiovascular status: blood pressure returned to baseline and stable Postop Assessment: no apparent nausea or vomiting Anesthetic complications: no   No notable events documented.  Last Vitals:  Vitals:   09/26/21 1445 09/26/21 1500  BP: 118/71 121/65  Pulse: (!) 54 (!) 56  Resp: 10 13  Temp:    SpO2: 96% 99%    Last Pain:  Vitals:   09/26/21 1445  TempSrc:   PainSc: Asleep                 Lynda Rainwater

## 2021-09-26 NOTE — Transfer of Care (Signed)
Immediate Anesthesia Transfer of Care Note  Patient: Taylor Cain  Procedure(s) Performed: RIGHT KNEE ARTHROSCOPY WITH PARTIAL MEDIAL MENISCECTOMY (Right: Knee)  Patient Location: PACU  Anesthesia Type:General  Level of Consciousness: drowsy  Airway & Oxygen Therapy: Patient Spontanous Breathing and Patient connected to face mask oxygen  Post-op Assessment: Report given to RN and Post -op Vital signs reviewed and stable  Post vital signs: Reviewed and stable  Last Vitals:  Vitals Value Taken Time  BP 124/75 09/26/21 1354  Temp    Pulse 58 09/26/21 1355  Resp 9 09/26/21 1355  SpO2 100 % 09/26/21 1355  Vitals shown include unvalidated device data.  Last Pain:  Vitals:   09/26/21 1112  TempSrc: Oral  PainSc: 2       Patients Stated Pain Goal: 6 (00/45/99 7741)  Complications: No notable events documented.

## 2021-09-26 NOTE — H&P (Signed)
PREOPERATIVE H&P  Chief Complaint: right knee medial meniscal tear  HPI: Taylor Cain is a 60 y.o. female who presents for surgical treatment of right knee medial meniscal tear.  She denies any changes in medical history.  Past Medical History:  Diagnosis Date   Anemia    Arthritis    right shoulder   Heart palpitations    made aware when patient was in 20's-30's; states they are very infrequent and pt. remains asymptomatic   History of pneumonia    Seasonal allergies    Past Surgical History:  Procedure Laterality Date   COLONOSCOPY     KNEE ARTHROSCOPY Right 1990, 1992   torn acl   SHOULDER ARTHROSCOPY WITH ROTATOR CUFF REPAIR Right 01/02/2017   Procedure: RIGHT SHOULDER ARTHROSCOPY WITH REVISION OF ROTATOR CUFF REPAIR;  Surgeon: Justice Britain, MD;  Location: [facility];  Service: Orthopedics;  Laterality: Right;   SHOULDER ARTHROSCOPY WITH ROTATOR CUFF REPAIR AND SUBACROMIAL DECOMPRESSION Right 07/25/2016   Procedure: RIGHT SHOULDER ARTHROSCOPY WITH ROTATOR CUFF REPAIR AND SUBACROMIAL DECOMPRESSION, DISTAL CLAVICLE RESECTION;  Surgeon: Justice Britain, MD;  Location: [facility];  Service: Orthopedics;  Laterality: Right;   Social History   Socioeconomic History   Marital status: Married    Spouse name: Not on file   Number of children: Not on file   Years of education: Not on file   Highest education level: Not on file  Occupational History   Occupation: DIRECTOR    Employer: [employer]  Tobacco Use   Smoking status: Never   Smokeless tobacco: Never  Substance and Sexual Activity   Alcohol use: Yes    Alcohol/week: 3.0 standard drinks    Types: 3 Glasses of wine per week    Comment: social   Drug use: No   Sexual activity: Not on file  Other Topics Concern   Not on file  Social History Narrative   No regular exercise   Eat fruits and veggies, no fast food, lean meats   Social Determinants of Health   Financial Resource Strain: Not on file  Food Insecurity: Not on  file  Transportation Needs: Not on file  Physical Activity: Not on file  Stress: Not on file  Social Connections: Not on file   Family History  Problem Relation Age of Onset   Migraines Mother    Hypertension Mother    Hypertension Maternal Grandmother    Alzheimer's disease Maternal Grandmother    Diabetes Maternal Grandfather    Colon cancer Neg Hx    Allergies  Allergen Reactions   Acetaminophen-Codeine Anxiety    "patien becomes antsy/anxious"   Prior to Admission medications   Medication Sig Start Date End Date Taking? Authorizing Provider  amoxicillin (AMOXIL) 500 MG capsule Take 500 mg by mouth 2 (two) times daily. Therapy continuously 10/29/16  Yes [provider]  Cholecalciferol 50 MCG (2000 UT) CAPS Take 2,000 Units by mouth daily.    Yes [provider]  clindamycin (CLINDAGEL) 1 % gel APPLY TO THE AFFECTED AREA(S) ON THE SKIN TWICE A DAY FOR ROSACEA 05/07/21  Yes   multivitamin-lutein (OCUVITE-LUTEIN) CAPS capsule Take 1 capsule by mouth daily.   Yes [provider]  ampicillin (PRINCIPEN) 500 MG capsule TAKE 1 CAPSULE BY MOUTH TWICE DAILY 02/14/21     COVID-19 mRNA vaccine, Moderna, 100 MCG/0.5ML injection Inject into the muscle. 05/02/21   Carlyle Basques, MD     Positive ROS: All other systems have been reviewed and were otherwise  negative with the exception of those mentioned in the HPI and as above.  Physical Exam: General: Alert, no acute distress Cardiovascular: No pedal edema Respiratory: No cyanosis, no use of accessory musculature GI: abdomen soft Skin: No lesions in the area of chief complaint Neurologic: Sensation intact distally Psychiatric: Patient is competent for consent with normal mood and affect Lymphatic: no lymphedema  MUSCULOSKELETAL: exam stable  Assessment: right knee medial meniscal tear  Plan: Plan for Procedure(s): RIGHT KNEE ARTHROSCOPY WITH PARTIAL MEDIAL MENISCECTOMY vs MENISCAL REPAIR  The risks  benefits and alternatives were discussed with the patient including but not limited to the risks of nonoperative treatment, versus surgical intervention including infection, bleeding, nerve injury,  blood clots, cardiopulmonary complications, morbidity, mortality, among others, and they were willing to proceed.   Preoperative templating of the joint replacement has been completed, documented, and submitted to the Operating Room personnel in order to optimize intra-operative equipment management.   Eduard Roux, MD 09/26/2021 10:49 AM

## 2021-09-26 NOTE — Op Note (Signed)
° °  Surgery Date: 09/26/2021  PREOPERATIVE DIAGNOSES:  1. Right knee medial meniscus tear  POSTOPERATIVE DIAGNOSES:  same  PROCEDURES PERFORMED:  1. Right knee arthroscopy with arthroscopic partial medial meniscectomy 3. Right knee arthroscopy with arthroscopic chondroplasty medial femoral condyle and trochlea  SURGEON: N. Eduard Roux, M.D.  ASSIST: Ciro Backer [place], [place]; necessary for the timely completion of procedure and due to complexity of procedure.  ANESTHESIA:  general  FLUIDS: Per anesthesia record.   ESTIMATED BLOOD LOSS: minimal  DESCRIPTION OF PROCEDURE: Ms. Hendriks is a 60 y.o.-year-old female with above mentioned conditions. Full discussion held regarding risks benefits alternatives and complications related surgical intervention. Conservative care options reviewed. All questions answered.  The patient was identified in the preoperative holding area and the operative extremity was marked. The patient was brought to the operating room and transferred to operating table in a supine position. Satisfactory general anesthesia was induced by anesthesiology.    Standard anterolateral, anteromedial arthroscopy portals were obtained. The anteromedial portal was obtained with a spinal needle for localization under direct visualization with subsequent diagnostic findings.   We first addressed the medial compartment by placing a gentle valgus force on the knee.  There was grade 2 changes of the medial femoral condyle and grade 1 changes of the medial tibial plateau.  We found a large complex radial tear between the junction of the mid body and the posterior horn.  After initial debridement of the tear we felt that this was not amenable to repair therefore partial medial meniscectomy was performed using a combination of oscillating shaver and meniscus basket back to stable border.  Probe was used to confirm adequate meniscectomy.  Gentle chondroplasty was performed for the medial  femoral condyle.  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  Lateral compartment was relatively unremarkable except for some grade 1 changes of the lateral femoral condyle.  Femoral trochlea showed grade 3 changes with a circular half centimeter area of the medial portion that was almost grade 4.  The patellar surface was grade 3.  Gutters were checked for loose bodies.  Excess fluid was removed from the knee joint.  Incisions were closed with interrupted nylon sutures.  Sterile dressings were applied.  Patient tolerated procedure well had no immediate complications.  Suprapatellar pouch and gutters: no synovitis or debris. Patella chondral surface: Grade 3 Trochlear chondral surface: Grade 3 Patellofemoral tracking: normal Medial meniscus: complex radial tear.  Medial femoral condyle weight bearing surface: Grade 2 Medial tibial plateau: Grade 1 Anterior cruciate ligament: chronic tear Posterior cruciate ligament:stable Lateral meniscus: normal.   Lateral femoral condyle weight bearing surface: Grade 1 Lateral tibial plateau: Grade 0  DISPOSITION: The patient was awakened from general anesthetic, extubated, taken to the recovery room in medically stable condition, no apparent complications. The patient may be weightbearing as tolerated to the operative lower extremity.  Range of motion of right knee as tolerated.  Azucena Cecil, MD [facility] 1:44 PM

## 2021-09-26 NOTE — Discharge Instructions (Addendum)

## 2021-09-27 ENCOUNTER — Encounter (HOSPITAL_BASED_OUTPATIENT_CLINIC_OR_DEPARTMENT_OTHER): Payer: Self-pay | Admitting: Orthopaedic Surgery

## 2021-10-03 ENCOUNTER — Encounter: Payer: Self-pay | Admitting: Orthopaedic Surgery

## 2021-10-03 ENCOUNTER — Other Ambulatory Visit: Payer: Self-pay

## 2021-10-03 ENCOUNTER — Ambulatory Visit (INDEPENDENT_AMBULATORY_CARE_PROVIDER_SITE_OTHER): Payer: 59 | Admitting: Physician Assistant

## 2021-10-03 DIAGNOSIS — Z9889 Other specified postprocedural states: Secondary | ICD-10-CM

## 2021-10-03 NOTE — Progress Notes (Signed)
Post-Op Visit Note   Patient: Taylor Cain           Date of Birth: [birth year]           MRN: [id number] Visit Date: 10/03/2021 PCP: Darreld Mclean, MD   Assessment & Plan:  Chief Complaint:  Chief Complaint  Patient presents with   Right Knee - Routine Post Op   Visit Diagnoses:  1. S/P right knee arthroscopy     Plan: Patient is a very pleasant 61 year old female who comes in today 1 week status post right knee arthroscopic debridement medial meniscus and chondroplasty medial femoral condyle and trochlea, date of surgery 09/26/2021.  It was noted during operative intervention that she had grade 3 changes to the patella chondral surface and trochlea in addition to grade 2 changes to the medial femoral condyle.  She has been doing well.  She has minimal to no pain and takes an occasional Tylenol as needed.  She has been working on range of motion exercises but notes that she has a slight fullness to her knee which causes a pinching sensation with full extension.  Otherwise, no complaints.  Examination of the right knee reveals well-healed surgical portals without complication.  Calf soft nontender.  She is neurovascular tact distally.  Today, sutures removed and Steri-Strips applied.  Range of motion handout provided.  She will follow-up with [place] in 5 weeks time for recheck.  Call with concerns or questions in the meantime.  Follow-Up Instructions: Return in about 5 weeks (around 11/07/2021).   Orders:  No orders of the defined types were placed in this encounter.  No orders of the defined types were placed in this encounter.   Imaging: No new imaging  PMFS History: Patient Active Problem List   Diagnosis Date Noted   Acute medial meniscus tear of right knee 08/16/2021   Encounter for counseling 07/06/2019   Low back strain, initial encounter 05/23/2017   Acne vulgaris 01/29/2013   Iron deficiency anemia 01/29/2013   Migraine without aura 10/15/2007   PREMATURE  VENTRICULAR CONTRACTIONS 10/15/2007   GERD 10/15/2007   Past Medical History:  Diagnosis Date   Anemia    Arthritis    right shoulder   Heart palpitations    made aware when patient was in 20's-30's; states they are very infrequent and pt. remains asymptomatic   History of pneumonia    Seasonal allergies     Family History  Problem Relation Age of Onset   Migraines Mother    Hypertension Mother    Hypertension Maternal Grandmother    Alzheimer's disease Maternal Grandmother    Diabetes Maternal Grandfather    Colon cancer Neg Hx     Past Surgical History:  Procedure Laterality Date   COLONOSCOPY     KNEE ARTHROSCOPY Right 1990, 1992   torn acl   KNEE ARTHROSCOPY WITH MENISCAL REPAIR Right 09/26/2021   Procedure: RIGHT KNEE ARTHROSCOPY WITH PARTIAL MEDIAL MENISCECTOMY AND CHONDROPLASTY MEDIAL FEMORAL CONDYLE AND TROCHLEA;  Surgeon: Leandrew Koyanagi, MD;  Location: [facility];  Service: Orthopedics;  Laterality: Right;   SHOULDER ARTHROSCOPY WITH ROTATOR CUFF REPAIR Right 01/02/2017   Procedure: RIGHT SHOULDER ARTHROSCOPY WITH REVISION OF ROTATOR CUFF REPAIR;  Surgeon: Justice Britain, MD;  Location: [facility];  Service: Orthopedics;  Laterality: Right;   SHOULDER ARTHROSCOPY WITH ROTATOR CUFF REPAIR AND SUBACROMIAL DECOMPRESSION Right 07/25/2016   Procedure: RIGHT SHOULDER ARTHROSCOPY WITH ROTATOR CUFF REPAIR AND SUBACROMIAL DECOMPRESSION, DISTAL CLAVICLE RESECTION;  Surgeon:  Justice Britain, MD;  Location: [facility];  Service: Orthopedics;  Laterality: Right;   Social History   Occupational History   Occupation: DIRECTOR    Employer: [employer]  Tobacco Use   Smoking status: Never    Passive exposure: Never   Smokeless tobacco: Never  Substance and Sexual Activity   Alcohol use: Yes    Alcohol/week: 3.0 standard drinks    Types: 3 Glasses of wine per week    Comment: social   Drug use: No   Sexual activity: Not on file

## 2021-10-08 ENCOUNTER — Encounter: Payer: Self-pay | Admitting: Orthopaedic Surgery

## 2021-10-09 DIAGNOSIS — D2272 Melanocytic nevi of left lower limb, including hip: Secondary | ICD-10-CM | POA: Diagnosis not present

## 2021-10-09 DIAGNOSIS — D225 Melanocytic nevi of trunk: Secondary | ICD-10-CM | POA: Diagnosis not present

## 2021-10-09 DIAGNOSIS — D2261 Melanocytic nevi of right upper limb, including shoulder: Secondary | ICD-10-CM | POA: Diagnosis not present

## 2021-10-09 DIAGNOSIS — D2262 Melanocytic nevi of left upper limb, including shoulder: Secondary | ICD-10-CM | POA: Diagnosis not present

## 2021-10-09 DIAGNOSIS — D2372 Other benign neoplasm of skin of left lower limb, including hip: Secondary | ICD-10-CM | POA: Diagnosis not present

## 2021-10-09 DIAGNOSIS — D485 Neoplasm of uncertain behavior of skin: Secondary | ICD-10-CM | POA: Diagnosis not present

## 2021-10-09 DIAGNOSIS — L821 Other seborrheic keratosis: Secondary | ICD-10-CM | POA: Diagnosis not present

## 2021-10-09 DIAGNOSIS — D1801 Hemangioma of skin and subcutaneous tissue: Secondary | ICD-10-CM | POA: Diagnosis not present

## 2021-10-09 DIAGNOSIS — L812 Freckles: Secondary | ICD-10-CM | POA: Diagnosis not present

## 2021-10-09 DIAGNOSIS — D2271 Melanocytic nevi of right lower limb, including hip: Secondary | ICD-10-CM | POA: Diagnosis not present

## 2021-10-10 NOTE — Telephone Encounter (Signed)
Note made. Sent to her on Mychart.

## 2021-10-31 ENCOUNTER — Other Ambulatory Visit (HOSPITAL_COMMUNITY): Payer: Self-pay

## 2021-11-10 NOTE — Progress Notes (Addendum)
[facility] [address], [place], [place] [address] [phone]  Date:  11/12/2021   Name:  Taylor Cain   DOB:  Nov 11, 1960   MRN:  [id number]  PCP:  Darreld Mclean, MD    Chief Complaint: Annual Exam (Concerns/ questions: 1. Abnormal mole was removed. /Flu shot today:received already/No NCIR)   History of Present Illness:  Taylor Cain is a 61 y.o. 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- they are working on a building plan, hope to open new facility in 2025   Her HA are not bothering her as long as she stays away from gluten - she does not have celiac but is sensitive to gluten  She had a mole removed with some abnl but not cancerous   She has not been able to exercise like normal recently She had a knee meniscus repair done in December  Her husband had back surgery which was a trial but he is better now   Flu vaccine- done  Covid booster- UTD  Mammo UTD Can offer dexa- will order for her  Pap 2/21- negative  Labs done one year ago   Wt Readings from Last 3 Encounters:  11/12/21 148 lb (67.1 kg)  09/26/21 142 lb 6.7 oz (64.6 kg)  05/04/21 140 lb (63.5 kg)     Patient Active Problem List   Diagnosis Date Noted   Acute medial meniscus tear of right knee 08/16/2021   Encounter for counseling 07/06/2019   Low back strain, initial encounter 05/23/2017   Acne vulgaris 01/29/2013   Iron deficiency anemia 01/29/2013   Migraine without aura 10/15/2007   PREMATURE VENTRICULAR CONTRACTIONS 10/15/2007   GERD 10/15/2007    Past Medical History:  Diagnosis Date   Anemia    Arthritis    right shoulder   Heart palpitations    made aware when patient was in 20's-30's; states they are  very infrequent and pt. remains asymptomatic   History of pneumonia    Seasonal allergies     Past Surgical History:  Procedure Laterality Date   COLONOSCOPY     KNEE ARTHROSCOPY Right 1990, 1992   torn acl   KNEE ARTHROSCOPY WITH MENISCAL REPAIR Right 09/26/2021   Procedure: RIGHT KNEE ARTHROSCOPY WITH PARTIAL MEDIAL MENISCECTOMY AND CHONDROPLASTY MEDIAL FEMORAL CONDYLE AND TROCHLEA;  Surgeon: Leandrew Koyanagi, MD;  Location: [facility];  Service: Orthopedics;  Laterality: Right;   SHOULDER ARTHROSCOPY WITH ROTATOR CUFF REPAIR Right 01/02/2017   Procedure: RIGHT SHOULDER ARTHROSCOPY WITH REVISION OF ROTATOR CUFF REPAIR;  Surgeon: Justice Britain, MD;  Location: [facility];  Service: Orthopedics;  Laterality: Right;   SHOULDER ARTHROSCOPY WITH ROTATOR CUFF REPAIR AND SUBACROMIAL DECOMPRESSION Right 07/25/2016   Procedure: RIGHT SHOULDER ARTHROSCOPY WITH ROTATOR CUFF REPAIR AND SUBACROMIAL DECOMPRESSION, DISTAL CLAVICLE RESECTION;  Surgeon: Justice Britain, MD;  Location: [facility];  Service: Orthopedics;  Laterality: Right;    Social History   Tobacco Use   Smoking status: Never    Passive exposure: Never   Smokeless tobacco: Never  Substance Use Topics   Alcohol use: Yes    Alcohol/week: 3.0 standard drinks    Types: 3 Glasses of wine per week  Comment: social   Drug use: No    Family History  Problem Relation Age of Onset   Migraines Mother    Hypertension Mother    Hypertension Maternal Grandmother    Alzheimer's disease Maternal Grandmother    Diabetes Maternal Grandfather    Colon cancer Neg Hx     Allergies  Allergen Reactions   Acetaminophen-Codeine Anxiety    "patien becomes antsy/anxious"    Medication list has been reviewed and updated.  Current Outpatient Medications on File Prior to Visit  Medication Sig Dispense Refill   ampicillin (PRINCIPEN) 500 MG capsule TAKE 1 CAPSULE BY MOUTH TWICE DAILY 60 capsule 5   Cholecalciferol 50 MCG (2000 UT) CAPS Take  2,000 Units by mouth daily.      clindamycin (CLINDAGEL) 1 % gel APPLY TO THE AFFECTED AREA(S) ON THE SKIN TWICE A DAY FOR ROSACEA 60 g 2   multivitamin-lutein (OCUVITE-LUTEIN) CAPS capsule Take 1 capsule by mouth daily.     No current facility-administered medications on file prior to visit.    Review of Systems:  As per HPI- otherwise negative.   Physical Examination: Vitals:   11/12/21 1515  BP: 125/69  Pulse: (!) 53  Resp: 18  Temp: 98.4 F (36.9 C)  SpO2: 99%   Vitals:   11/12/21 1515  Weight: 148 lb (67.1 kg)  Height: 5' 6.5" (1.689 m)   Body mass index is 23.53 kg/m. Ideal Body Weight: Weight in (lb) to have BMI = 25: 156.9  GEN: no acute distress.  Normal weight, looks well and younger than age 52: Atraumatic, Normocephalic.  Bilateral TM wnl, oropharynx normal.  PEERL,EOMI.   Ears and Nose: No external deformity. CV: RRR, No M/G/R. No JVD. No thrill. No extra heart sounds. PULM: CTA B, no wheezes, crackles, rhonchi. No retractions. No resp. distress. No accessory muscle use. ABD: S, NT, ND, +BS. No rebound. No HSM. EXTR: No c/c/e PSYCH: Normally interactive. Conversant.    Assessment and Plan: Physical exam  Screening for diabetes mellitus - Plan: Comprehensive metabolic panel, Hemoglobin A1c  Screening for hyperlipidemia - Plan: Lipid panel  Screening for thyroid disorder - Plan: TSH  Iron deficiency anemia secondary to inadequate dietary iron intake - Plan: CBC, Ferritin  Fatigue, unspecified type - Plan: TSH, VITAMIN D 25 Hydroxy (Vit-D Deficiency, Fractures)  Estrogen deficiency - Plan: DG Bone Density  Patient seen today for physical exam.  No major concerns, she is struggling with a knee problem but is on the mend-she hopes to get back to exercise soon  Encouraged healthy diet and exercise routine Will plan further follow- up pending labs. Ordered bone density scan Signed Lamar Blinks, MD  Addendum 2/15, received labs as below.   Message to patient  Results for orders placed or performed in visit on 11/12/21  CBC  Result Value Ref Range   WBC 4.7 4.0 - 10.5 K/uL   RBC 3.93 3.87 - 5.11 Mil/uL   Platelets 176.0 150.0 - 400.0 K/uL   Hemoglobin 12.4 12.0 - 15.0 g/dL   HCT 37.1 36.0 - 46.0 %   MCV 94.5 78.0 - 100.0 fl   MCHC 33.5 30.0 - 36.0 g/dL   RDW 13.4 11.5 - 15.5 %  Comprehensive metabolic panel  Result Value Ref Range   Sodium 138 135 - 145 mEq/L   Potassium 4.2 3.5 - 5.1 mEq/L   Chloride 100 96 - 112 mEq/L   CO2 33 (H) 19 - 32 mEq/L   Glucose, Bld 88 70 -  99 mg/dL   BUN 14 6 - 23 mg/dL   Creatinine, Ser 0.77 0.40 - 1.20 mg/dL   Total Bilirubin 0.4 0.2 - 1.2 mg/dL   Alkaline Phosphatase 51 39 - 117 U/L   AST 24 0 - 37 U/L   ALT 13 0 - 35 U/L   Total Protein 7.0 6.0 - 8.3 g/dL   Albumin 4.6 3.5 - 5.2 g/dL   GFR 82.43 >60.00 mL/min   Calcium 9.5 8.4 - 10.5 mg/dL  Hemoglobin A1c  Result Value Ref Range   Hgb A1c MFr Bld 5.6 4.6 - 6.5 %  Lipid panel  Result Value Ref Range   Cholesterol 201 (H) 0 - 200 mg/dL   Triglycerides 57.0 0.0 - 149.0 mg/dL   HDL 82.90 >39.00 mg/dL   VLDL 11.4 0.0 - 40.0 mg/dL   LDL Cholesterol 107 (H) 0 - 99 mg/dL   Total CHOL/HDL Ratio 2    NonHDL 118.44   TSH  Result Value Ref Range   TSH 1.49 0.35 - 5.50 uIU/mL  VITAMIN D 25 Hydroxy (Vit-D Deficiency, Fractures)  Result Value Ref Range   VITD 48.96 30.00 - 100.00 ng/mL  Ferritin  Result Value Ref Range   Ferritin 66.6 10.0 - 291.0 ng/mL

## 2021-11-10 NOTE — Patient Instructions (Addendum)
Good to see you again - I will be in touch with your labs asap  Please call [facility] to set up your bone density scan   Address: [address], [place], [place] [address] Phone: [phone]

## 2021-11-12 ENCOUNTER — Ambulatory Visit (INDEPENDENT_AMBULATORY_CARE_PROVIDER_SITE_OTHER): Payer: 59 | Admitting: Family Medicine

## 2021-11-12 VITALS — BP 125/69 | HR 53 | Temp 98.4°F | Resp 18 | Ht 66.5 in | Wt 148.0 lb

## 2021-11-12 DIAGNOSIS — R5383 Other fatigue: Secondary | ICD-10-CM

## 2021-11-12 DIAGNOSIS — Z131 Encounter for screening for diabetes mellitus: Secondary | ICD-10-CM | POA: Diagnosis not present

## 2021-11-12 DIAGNOSIS — E2839 Other primary ovarian failure: Secondary | ICD-10-CM | POA: Diagnosis not present

## 2021-11-12 DIAGNOSIS — Z13220 Encounter for screening for lipoid disorders: Secondary | ICD-10-CM | POA: Diagnosis not present

## 2021-11-12 DIAGNOSIS — Z0000 Encounter for general adult medical examination without abnormal findings: Secondary | ICD-10-CM | POA: Diagnosis not present

## 2021-11-12 DIAGNOSIS — D508 Other iron deficiency anemias: Secondary | ICD-10-CM | POA: Diagnosis not present

## 2021-11-12 DIAGNOSIS — Z1329 Encounter for screening for other suspected endocrine disorder: Secondary | ICD-10-CM

## 2021-11-13 ENCOUNTER — Ambulatory Visit (INDEPENDENT_AMBULATORY_CARE_PROVIDER_SITE_OTHER): Payer: 59 | Admitting: Orthopaedic Surgery

## 2021-11-13 ENCOUNTER — Encounter: Payer: Self-pay | Admitting: Orthopaedic Surgery

## 2021-11-13 ENCOUNTER — Other Ambulatory Visit: Payer: Self-pay

## 2021-11-13 DIAGNOSIS — S83241A Other tear of medial meniscus, current injury, right knee, initial encounter: Secondary | ICD-10-CM

## 2021-11-13 DIAGNOSIS — Z9889 Other specified postprocedural states: Secondary | ICD-10-CM

## 2021-11-13 LAB — COMPREHENSIVE METABOLIC PANEL
ALT: 13 U/L (ref 0–35)
AST: 24 U/L (ref 0–37)
Albumin: 4.6 g/dL (ref 3.5–5.2)
Alkaline Phosphatase: 51 U/L (ref 39–117)
BUN: 14 mg/dL (ref 6–23)
CO2: 33 mEq/L — ABNORMAL HIGH (ref 19–32)
Calcium: 9.5 mg/dL (ref 8.4–10.5)
Chloride: 100 mEq/L (ref 96–112)
Creatinine, Ser: 0.77 mg/dL (ref 0.40–1.20)
GFR: 82.43 mL/min (ref >60.00)
Glucose, Bld: 88 mg/dL (ref 70–99)
Potassium: 4.2 mEq/L (ref 3.5–5.1)
Sodium: 138 mEq/L (ref 135–145)
Total Bilirubin: 0.4 mg/dL (ref 0.2–1.2)
Total Protein: 7 g/dL (ref 6.0–8.3)

## 2021-11-13 LAB — LIPID PANEL
Cholesterol: 201 mg/dL — ABNORMAL HIGH (ref 0–200)
HDL: 82.9 mg/dL (ref >39.00)
LDL Cholesterol: 107 mg/dL — ABNORMAL HIGH (ref 0–99)
NonHDL: 118.44
Total CHOL/HDL Ratio: 2
Triglycerides: 57 mg/dL (ref 0.0–149.0)
VLDL: 11.4 mg/dL (ref 0.0–40.0)

## 2021-11-13 LAB — FERRITIN: Ferritin: 66.6 ng/mL (ref 10.0–291.0)

## 2021-11-13 LAB — CBC
HCT: 37.1 % (ref 36.0–46.0)
Hemoglobin: 12.4 g/dL (ref 12.0–15.0)
MCHC: 33.5 g/dL (ref 30.0–36.0)
MCV: 94.5 fl (ref 78.0–100.0)
Platelets: 176 10*3/uL (ref 150.0–400.0)
RBC: 3.93 Mil/uL (ref 3.87–5.11)
RDW: 13.4 % (ref 11.5–15.5)
WBC: 4.7 10*3/uL (ref 4.0–10.5)

## 2021-11-13 LAB — HEMOGLOBIN A1C: Hgb A1c MFr Bld: 5.6 % (ref 4.6–6.5)

## 2021-11-13 LAB — TSH: TSH: 1.49 u[IU]/mL (ref 0.35–5.50)

## 2021-11-13 LAB — VITAMIN D 25 HYDROXY (VIT D DEFICIENCY, FRACTURES): VITD: 48.96 ng/mL (ref 30.00–100.00)

## 2021-11-13 NOTE — Progress Notes (Signed)
Post-Op Visit Note   Patient: Taylor Cain           Date of Birth: Sep 06, 1961           MRN: [id number] Visit Date: 11/13/2021 PCP: Darreld Mclean, MD   Assessment & Plan:  Chief Complaint:  Chief Complaint  Patient presents with   Right Knee - Follow-up    Right knee arthroscopy 09/26/2021   Visit Diagnoses:  1. S/P right knee arthroscopy   2. Acute medial meniscus tear of right knee, initial encounter     Plan: Ms. Gene is 6 weeks status post right partial medial meniscectomy.  Overall she is doing well but feels occasional symptoms instability.  She still feels swelling inside the knee and in the back of the knee.  She has been doing some walks around the neighborhood.  She has returned back to work.  She feels some tenderness around the portal sites.  Examination of the right knee shows fully healed portal scars.  She has near full range of motion.  She does have a small effusion and palpable Baker's cyst.  No signs of infection.  Based on findings she has some residual inflammation and effusion in the knee.  We discussed options of aspiration cortisone injection versus relative rest and 2 weeks of scheduled NSAIDs.  Since her symptoms are manageable she would like to just try the medication route first.  She will also do RICE.  Activity modifications and restrictions were reviewed.  If she is not better in about 4 weeks she should follow-up with me again.  Otherwise follow-up as needed.  Follow-Up Instructions: No follow-ups on file.   Orders:  No orders of the defined types were placed in this encounter.  No orders of the defined types were placed in this encounter.   Imaging: No results found.  PMFS History: Patient Active Problem List   Diagnosis Date Noted   Acute medial meniscus tear of right knee 08/16/2021   Encounter for counseling 07/06/2019   Low back strain, initial encounter 05/23/2017   Acne vulgaris 01/29/2013   Iron deficiency anemia  01/29/2013   Migraine without aura 10/15/2007   PREMATURE VENTRICULAR CONTRACTIONS 10/15/2007   GERD 10/15/2007   Past Medical History:  Diagnosis Date   Anemia    Arthritis    right shoulder   Heart palpitations    made aware when patient was in 20's-30's; states they are very infrequent and pt. remains asymptomatic   History of pneumonia    Seasonal allergies     Family History  Problem Relation Age of Onset   Migraines Mother    Hypertension Mother    Hypertension Maternal Grandmother    Alzheimer's disease Maternal Grandmother    Diabetes Maternal Grandfather    Colon cancer Neg Hx     Past Surgical History:  Procedure Laterality Date   COLONOSCOPY     KNEE ARTHROSCOPY Right 1990, 1992   torn acl   KNEE ARTHROSCOPY WITH MENISCAL REPAIR Right 09/26/2021   Procedure: RIGHT KNEE ARTHROSCOPY WITH PARTIAL MEDIAL MENISCECTOMY AND CHONDROPLASTY MEDIAL FEMORAL CONDYLE AND TROCHLEA;  Surgeon: Leandrew Koyanagi, MD;  Location: [facility];  Service: Orthopedics;  Laterality: Right;   SHOULDER ARTHROSCOPY WITH ROTATOR CUFF REPAIR Right 01/02/2017   Procedure: RIGHT SHOULDER ARTHROSCOPY WITH REVISION OF ROTATOR CUFF REPAIR;  Surgeon: Justice Britain, MD;  Location: [facility];  Service: Orthopedics;  Laterality: Right;   SHOULDER ARTHROSCOPY WITH ROTATOR CUFF REPAIR AND SUBACROMIAL  DECOMPRESSION Right 07/25/2016   Procedure: RIGHT SHOULDER ARTHROSCOPY WITH ROTATOR CUFF REPAIR AND SUBACROMIAL DECOMPRESSION, DISTAL CLAVICLE RESECTION;  Surgeon: Justice Britain, MD;  Location: [facility];  Service: Orthopedics;  Laterality: Right;   Social History   Occupational History   Occupation: DIRECTOR    Employer: [employer]  Tobacco Use   Smoking status: Never    Passive exposure: Never   Smokeless tobacco: Never  Substance and Sexual Activity   Alcohol use: Yes    Alcohol/week: 3.0 standard drinks    Types: 3 Glasses of wine per week    Comment: social   Drug use: No   Sexual activity: Not  on file

## 2021-11-14 ENCOUNTER — Encounter: Payer: Self-pay | Admitting: Family Medicine

## 2022-01-11 ENCOUNTER — Other Ambulatory Visit (HOSPITAL_COMMUNITY): Payer: Self-pay

## 2022-01-24 DIAGNOSIS — H524 Presbyopia: Secondary | ICD-10-CM | POA: Diagnosis not present

## 2022-01-24 DIAGNOSIS — H52223 Regular astigmatism, bilateral: Secondary | ICD-10-CM | POA: Diagnosis not present

## 2022-01-24 DIAGNOSIS — H5212 Myopia, left eye: Secondary | ICD-10-CM | POA: Diagnosis not present

## 2022-01-31 ENCOUNTER — Ambulatory Visit (INDEPENDENT_AMBULATORY_CARE_PROVIDER_SITE_OTHER): Payer: 59 | Admitting: Orthopaedic Surgery

## 2022-01-31 ENCOUNTER — Encounter: Payer: Self-pay | Admitting: Orthopaedic Surgery

## 2022-01-31 DIAGNOSIS — M25561 Pain in right knee: Secondary | ICD-10-CM | POA: Diagnosis not present

## 2022-01-31 DIAGNOSIS — Z9889 Other specified postprocedural states: Secondary | ICD-10-CM

## 2022-01-31 MED ORDER — METHYLPREDNISOLONE ACETATE 40 MG/ML IJ SUSP
40.0000 mg | INTRAMUSCULAR | Status: AC | PRN
  Administered 2022-01-31: 40 mg via INTRA_ARTICULAR

## 2022-01-31 MED ORDER — BUPIVACAINE HCL 0.5 % IJ SOLN
2.0000 mL | INTRAMUSCULAR | Status: AC | PRN
  Administered 2022-01-31: 2 mL via INTRA_ARTICULAR

## 2022-01-31 MED ORDER — LIDOCAINE HCL 1 % IJ SOLN
2.0000 mL | INTRAMUSCULAR | Status: AC | PRN
  Administered 2022-01-31: 2 mL

## 2022-01-31 NOTE — Progress Notes (Signed)
? ?Office Visit Note ?  ?Patient: Taylor Cain           ?Date of Birth: 06/18/1961           ?MRN: [id number] ?Visit Date: 01/31/2022 ?             ?Requested by: Darreld Mclean, MD ?[address] ?[place],  [place] [address] ?PCP: Darreld Mclean, MD ? ? ?Assessment & Plan: ?Visit Diagnoses:  ?1. S/P right knee arthroscopy   ? ? ?Plan: Ms. Farnell returns today for continued right knee pain and swelling.  She takes Advil daily.  Underwent right knee scope partial medial meniscectomy on 09/26/2021.  She is able to work out and has tried walking and some running but the pain and swelling is worse with activity and towards the end of the day. ? ?Examination of the right knee shows trace effusion and diffuse soft tissue swelling.  Range of motion is not limited.  No joint line tenderness. ? ?I reviewed the arthroscopic photos with Ms. Backstrom today.  Doubt that she has a recurrent meniscal tear.  She does have some areas with grade 3 changes.  I feel that her knee is probably still inflamed from the surgery and to a certain extent overactivity.  I would like to go ahead and do a cortisone injection today and have her rest it for the next 2 weeks and then increase her activity as tolerated.  Pamphlet on Vis[employer] provided today. ? ?Follow-Up Instructions: No follow-ups on file.  ? ?Orders:  ?No orders of the defined types were placed in this encounter. ? ?No orders of the defined types were placed in this encounter. ? ? ? ? Procedures: ?Large Joint Inj: R knee on 01/31/2022 2:39 PM ?Indications: pain ?Details: 22 G needle ? ?Arthrogram: No ? ?Medications: 40 mg methylPREDNISolone acetate 40 MG/ML; 2 mL lidocaine 1 %; 2 mL bupivacaine 0.5 % ?Consent was given by the patient. Patient was prepped and draped in the usual sterile fashion.  ? ? ? ? ?Clinical Data: ?No additional findings. ? ? ?Subjective: ?Chief Complaint  ?Patient presents with  ? Right Knee - Pain  ? ? ?HPI ? ?Review of  Systems ? ? ?Objective: ?Vital Signs: LMP 01/28/2014  ? ?Physical Exam ? ?Ortho Exam ? ?Specialty Comments:  ?No specialty comments available. ? ?Imaging: ?No results found. ? ? ?PMFS History: ?Patient Active Problem List  ? Diagnosis Date Noted  ? Acute medial meniscus tear of right knee 08/16/2021  ? Encounter for counseling 07/06/2019  ? Low back strain, initial encounter 05/23/2017  ? Acne vulgaris 01/29/2013  ? Iron deficiency anemia 01/29/2013  ? Migraine without aura 10/15/2007  ? PREMATURE VENTRICULAR CONTRACTIONS 10/15/2007  ? GERD 10/15/2007  ? ?Past Medical History:  ?Diagnosis Date  ? Anemia   ? Arthritis   ? right shoulder  ? Heart palpitations   ? made aware when patient was in 20's-30's; states they are very infrequent and pt. remains asymptomatic  ? History of pneumonia   ? Seasonal allergies   ?  ?Family History  ?Problem Relation Age of Onset  ? Migraines Mother   ? Hypertension Mother   ? Hypertension Maternal Grandmother   ? Alzheimer's disease Maternal Grandmother   ? Diabetes Maternal Grandfather   ? Colon cancer Neg Hx   ?  ?Past Surgical History:  ?Procedure Laterality Date  ? COLONOSCOPY    ? KNEE ARTHROSCOPY Right 1990, 1992  ? torn  acl  ? KNEE ARTHROSCOPY WITH MENISCAL REPAIR Right 09/26/2021  ? Procedure: RIGHT KNEE ARTHROSCOPY WITH PARTIAL MEDIAL MENISCECTOMY AND CHONDROPLASTY MEDIAL FEMORAL CONDYLE AND TROCHLEA;  Surgeon: Leandrew Koyanagi, MD;  Location: [facility];  Service: Orthopedics;  Laterality: Right;  ? SHOULDER ARTHROSCOPY WITH ROTATOR CUFF REPAIR Right 01/02/2017  ? Procedure: RIGHT SHOULDER ARTHROSCOPY WITH REVISION OF ROTATOR CUFF REPAIR;  Surgeon: Justice Britain, MD;  Location: [facility];  Service: Orthopedics;  Laterality: Right;  ? SHOULDER ARTHROSCOPY WITH ROTATOR CUFF REPAIR AND SUBACROMIAL DECOMPRESSION Right 07/25/2016  ? Procedure: RIGHT SHOULDER ARTHROSCOPY WITH ROTATOR CUFF REPAIR AND SUBACROMIAL DECOMPRESSION, DISTAL CLAVICLE RESECTION;  Surgeon: Justice Britain,  MD;  Location: [facility];  Service: Orthopedics;  Laterality: Right;  ? ?Social History  ? ?Occupational History  ? Occupation: DIRECTOR  ?  Employer: [employer]  ?Tobacco Use  ? Smoking status: Never  ?  Passive exposure: Never  ? Smokeless tobacco: Never  ?Substance and Sexual Activity  ? Alcohol use: Yes  ?  Alcohol/week: 3.0 standard drinks  ?  Types: 3 Glasses of wine per week  ?  Comment: social  ? Drug use: No  ? Sexual activity: Not on file  ? ? ? ? ? ? ?

## 2022-02-25 ENCOUNTER — Other Ambulatory Visit (HOSPITAL_COMMUNITY): Payer: Self-pay

## 2022-02-26 ENCOUNTER — Other Ambulatory Visit (HOSPITAL_COMMUNITY): Payer: Self-pay

## 2022-02-26 MED ORDER — AMPICILLIN 500 MG PO CAPS
ORAL_CAPSULE | ORAL | 2 refills | Status: DC
  Filled 2022-02-26: qty 60, 30d supply, fill #0
  Filled 2022-05-14: qty 60, 30d supply, fill #1
  Filled 2022-06-10: qty 60, 30d supply, fill #2

## 2022-03-04 ENCOUNTER — Other Ambulatory Visit (HOSPITAL_COMMUNITY): Payer: Self-pay

## 2022-03-04 MED ORDER — TRIAMCINOLONE ACETONIDE 0.1 % MT PSTE
PASTE | OROMUCOSAL | 12 refills | Status: DC
  Filled 2022-03-04: qty 5, 10d supply, fill #0
  Filled 2022-08-20: qty 5, 10d supply, fill #1
  Filled 2022-11-17: qty 5, 10d supply, fill #2

## 2022-03-11 ENCOUNTER — Encounter: Payer: Self-pay | Admitting: Orthopaedic Surgery

## 2022-03-12 ENCOUNTER — Telehealth: Payer: Self-pay

## 2022-03-12 NOTE — Telephone Encounter (Signed)
Can you let the patient know that we are happy to submit for visco approval for the right knee, but we cannot guarantee it will be approved prior to her trip.  That is up to insurance.  Also, some people do notice some discomfort for the first day or two following the injection, but I do not think it is common to notice discomfort after that.  The injection can take up to 6 weeks to take effect in some people.  If she wishes to proceed with injection, can you submit to April asap to try and get approval before her trip?

## 2022-03-12 NOTE — Telephone Encounter (Signed)
VOB submitted for  Durolane, right knee. °BV pending. °

## 2022-03-21 ENCOUNTER — Other Ambulatory Visit: Payer: Self-pay

## 2022-03-21 ENCOUNTER — Telehealth: Payer: Self-pay

## 2022-03-21 DIAGNOSIS — Z9889 Other specified postprocedural states: Secondary | ICD-10-CM

## 2022-03-21 NOTE — Telephone Encounter (Signed)
Called and left a Vm for patient to CB to schedule with Dr. Erlinda Hong for gel injection.  Check referrals tab

## 2022-04-03 ENCOUNTER — Ambulatory Visit (INDEPENDENT_AMBULATORY_CARE_PROVIDER_SITE_OTHER): Payer: 59 | Admitting: Orthopaedic Surgery

## 2022-04-03 DIAGNOSIS — M1711 Unilateral primary osteoarthritis, right knee: Secondary | ICD-10-CM

## 2022-04-03 MED ORDER — SODIUM HYALURONATE 60 MG/3ML IX PRSY
60.0000 mg | PREFILLED_SYRINGE | INTRA_ARTICULAR | Status: AC | PRN
  Administered 2022-04-03: 60 mg via INTRA_ARTICULAR

## 2022-04-03 MED ORDER — BUPIVACAINE HCL 0.25 % IJ SOLN
2.0000 mL | INTRAMUSCULAR | Status: AC | PRN
  Administered 2022-04-03: 2 mL via INTRA_ARTICULAR

## 2022-04-03 MED ORDER — LIDOCAINE HCL 1 % IJ SOLN
2.0000 mL | INTRAMUSCULAR | Status: AC | PRN
  Administered 2022-04-03: 2 mL

## 2022-04-03 NOTE — Progress Notes (Signed)
Office Visit Note   Patient: Taylor Cain           Date of Birth: August 25, 1961           MRN: [id number] Visit Date: 04/03/2022              Requested by: Darreld Mclean, MD [address] [place],  [place] [address] PCP: Darreld Mclean, MD   Assessment & Plan: Visit Diagnoses:  1. Unilateral primary osteoarthritis, right knee     Plan: Impression is right knee osteoarthritis.  Today, proceeded with right knee Durolane injection.  She tolerated this well.  She will follow-up with [place] as needed.  Call with concerns or questions.  Follow-Up Instructions: Return if symptoms worsen or fail to improve.   Orders:  Orders Placed This Encounter  Procedures   Large Joint Inj: R knee   No orders of the defined types were placed in this encounter.     Procedures: Large Joint Inj: R knee on 04/03/2022 2:58 PM Indications: pain Details: 22 G needle, anterolateral approach Medications: 2 mL lidocaine 1 %; 2 mL bupivacaine 0.25 %; 60 mg Sodium Hyaluronate 60 MG/3ML      Clinical Data: No additional findings.   Subjective: Chief Complaint  Patient presents with   Right Knee - Follow-up    HPI patient is a pleasant 61 year old female with underlying right knee arthritis who comes in today for Durolane injection.  Previously undergone cortisone injection without long-lasting relief.  No previous viscosupplementation injection.     Objective: Vital Signs: LMP 01/28/2014     Ortho Exam stable right knee exam  Specialty Comments:  No specialty comments available.  Imaging: No new imaging   PMFS History: Patient Active Problem List   Diagnosis Date Noted   Acute medial meniscus tear of right knee 08/16/2021   Encounter for counseling 07/06/2019   Low back strain, initial encounter 05/23/2017   Acne vulgaris 01/29/2013   Iron deficiency anemia 01/29/2013   Migraine without aura 10/15/2007   PREMATURE VENTRICULAR CONTRACTIONS 10/15/2007    GERD 10/15/2007   Past Medical History:  Diagnosis Date   Anemia    Arthritis    right shoulder   Heart palpitations    made aware when patient was in 20's-30's; states they are very infrequent and pt. remains asymptomatic   History of pneumonia    Seasonal allergies     Family History  Problem Relation Age of Onset   Migraines Mother    Hypertension Mother    Hypertension Maternal Grandmother    Alzheimer's disease Maternal Grandmother    Diabetes Maternal Grandfather    Colon cancer Neg Hx     Past Surgical History:  Procedure Laterality Date   COLONOSCOPY     KNEE ARTHROSCOPY Right 1990, 1992   torn acl   KNEE ARTHROSCOPY WITH MENISCAL REPAIR Right 09/26/2021   Procedure: RIGHT KNEE ARTHROSCOPY WITH PARTIAL MEDIAL MENISCECTOMY AND CHONDROPLASTY MEDIAL FEMORAL CONDYLE AND TROCHLEA;  Surgeon: Leandrew Koyanagi, MD;  Location: [facility];  Service: Orthopedics;  Laterality: Right;   SHOULDER ARTHROSCOPY WITH ROTATOR CUFF REPAIR Right 01/02/2017   Procedure: RIGHT SHOULDER ARTHROSCOPY WITH REVISION OF ROTATOR CUFF REPAIR;  Surgeon: Justice Britain, MD;  Location: [facility];  Service: Orthopedics;  Laterality: Right;   SHOULDER ARTHROSCOPY WITH ROTATOR CUFF REPAIR AND SUBACROMIAL DECOMPRESSION Right 07/25/2016   Procedure: RIGHT SHOULDER ARTHROSCOPY WITH ROTATOR CUFF REPAIR AND SUBACROMIAL DECOMPRESSION, DISTAL CLAVICLE RESECTION;  Surgeon: Justice Britain, MD;  Location: [facility];  Service: Orthopedics;  Laterality: Right;   Social History   Occupational History   Occupation: DIRECTOR    Employer: [employer]  Tobacco Use   Smoking status: Never    Passive exposure: Never   Smokeless tobacco: Never  Substance and Sexual Activity   Alcohol use: Yes    Alcohol/week: 3.0 standard drinks of alcohol    Types: 3 Glasses of wine per week    Comment: social   Drug use: No   Sexual activity: Not on file

## 2022-05-14 ENCOUNTER — Other Ambulatory Visit (HOSPITAL_COMMUNITY): Payer: Self-pay

## 2022-05-27 ENCOUNTER — Other Ambulatory Visit: Payer: Self-pay | Admitting: Family Medicine

## 2022-05-27 DIAGNOSIS — Z1231 Encounter for screening mammogram for malignant neoplasm of breast: Secondary | ICD-10-CM

## 2022-06-04 ENCOUNTER — Ambulatory Visit (INDEPENDENT_AMBULATORY_CARE_PROVIDER_SITE_OTHER): Payer: Self-pay | Admitting: Plastic Surgery

## 2022-06-04 ENCOUNTER — Encounter: Payer: Self-pay | Admitting: Plastic Surgery

## 2022-06-04 DIAGNOSIS — Z719 Counseling, unspecified: Secondary | ICD-10-CM

## 2022-06-04 NOTE — Progress Notes (Signed)

## 2022-06-10 ENCOUNTER — Other Ambulatory Visit (HOSPITAL_COMMUNITY): Payer: Self-pay

## 2022-06-10 MED ORDER — CLINDAMYCIN PHOSPHATE 1 % EX GEL
CUTANEOUS | 2 refills | Status: DC
  Filled 2022-06-10: qty 60, 30d supply, fill #0

## 2022-06-21 ENCOUNTER — Ambulatory Visit
Admission: RE | Admit: 2022-06-21 | Discharge: 2022-06-21 | Disposition: A | Discharge status: Home / Self Care | Payer: 59 | Source: Ambulatory Visit | Attending: Family Medicine | Admitting: Family Medicine

## 2022-06-21 DIAGNOSIS — Z1231 Encounter for screening mammogram for malignant neoplasm of breast: Secondary | ICD-10-CM | POA: Diagnosis not present

## 2022-08-06 ENCOUNTER — Ambulatory Visit (INDEPENDENT_AMBULATORY_CARE_PROVIDER_SITE_OTHER): Payer: 59 | Admitting: Orthopaedic Surgery

## 2022-08-06 ENCOUNTER — Encounter: Payer: Self-pay | Admitting: Orthopaedic Surgery

## 2022-08-06 DIAGNOSIS — M1711 Unilateral primary osteoarthritis, right knee: Secondary | ICD-10-CM | POA: Diagnosis not present

## 2022-08-06 DIAGNOSIS — Z9889 Other specified postprocedural states: Secondary | ICD-10-CM

## 2022-08-06 NOTE — Progress Notes (Addendum)
Office Visit Note   Patient: Taylor Cain           Date of Birth: 1960-12-02           MRN: [id number] Visit Date: 08/06/2022              Requested by: Darreld Mclean, MD [address] [place],  [place] [address] PCP: Darreld Mclean, MD   Assessment & Plan: Visit Diagnoses:  1. S/P right knee arthroscopy   2. 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  Disease process and treatment options were reviewed and activity restrictions and modifications were also discussed.  We will order medial unloader brace for her.  We will try Voltaren gel.  She may want to consider viscosupplementation again in the future..  Follow-Up Instructions: No follow-ups on file.   Orders:  No orders of the defined types were placed in this encounter.  No orders of the defined types were placed in this encounter.     Procedures: No procedures performed   Clinical Data: No additional findings.   Subjective: Chief Complaint  Patient presents with   Right Knee - Pain    HPI Taylor Cain returns today for evaluation of right knee symptoms.  She underwent right knee scope with partial medial meniscectomy and chondroplasty about 10 months ago.  She states that she has trouble fully extending and feels like there is something in there and cannot fully trust her knee.  She is still very active and can run 5 miles but definitely is symptomatic.  She had Durolane injected in July with some improvement.  Review of Systems   Objective: Vital Signs: LMP 01/28/2014   Physical Exam  Ortho Exam Examination of the right knee shows no joint line tenderness.  No joint effusion.  Basically has normal range of motion.  Mild valgus instability with functional testing.   Specialty Comments:  No specialty comments available.  Imaging: No results found.   PMFS  History: Patient Active Problem List   Diagnosis Date Noted   Acute medial meniscus tear of right knee 08/16/2021   Encounter for counseling 07/06/2019   Low back strain, initial encounter 05/23/2017   Acne vulgaris 01/29/2013   Iron deficiency anemia 01/29/2013   Migraine without aura 10/15/2007   PREMATURE VENTRICULAR CONTRACTIONS 10/15/2007   GERD 10/15/2007   Past Medical History:  Diagnosis Date   Anemia    Arthritis    right shoulder   Heart palpitations    made aware when patient was in 20's-30's; states they are very infrequent and pt. remains asymptomatic   History of pneumonia    Seasonal allergies     Family History  Problem Relation Age of Onset   Migraines Mother    Hypertension Mother    Hypertension Maternal Grandmother    Alzheimer's disease Maternal Grandmother    Diabetes Maternal Grandfather    Colon cancer Neg Hx     Past Surgical History:  Procedure Laterality Date   COLONOSCOPY     KNEE ARTHROSCOPY Right 1990, 1992   torn acl   KNEE ARTHROSCOPY WITH MENISCAL REPAIR Right 09/26/2021   Procedure: RIGHT KNEE ARTHROSCOPY WITH PARTIAL MEDIAL MENISCECTOMY AND CHONDROPLASTY MEDIAL FEMORAL CONDYLE AND TROCHLEA;  Surgeon: Leandrew Koyanagi, MD;  Location: [facility];  Service: Orthopedics;  Laterality: Right;   SHOULDER ARTHROSCOPY WITH ROTATOR CUFF REPAIR  Right 01/02/2017   Procedure: RIGHT SHOULDER ARTHROSCOPY WITH REVISION OF ROTATOR CUFF REPAIR;  Surgeon: Justice Britain, MD;  Location: [facility];  Service: Orthopedics;  Laterality: Right;   SHOULDER ARTHROSCOPY WITH ROTATOR CUFF REPAIR AND SUBACROMIAL DECOMPRESSION Right 07/25/2016   Procedure: RIGHT SHOULDER ARTHROSCOPY WITH ROTATOR CUFF REPAIR AND SUBACROMIAL DECOMPRESSION, DISTAL CLAVICLE RESECTION;  Surgeon: Justice Britain, MD;  Location: [facility];  Service: Orthopedics;  Laterality: Right;   Social History   Occupational History   Occupation: DIRECTOR    Employer: [employer]  Tobacco Use   Smoking  status: Never    Passive exposure: Never   Smokeless tobacco: Never  Substance and Sexual Activity   Alcohol use: Yes    Alcohol/week: 3.0 standard drinks of alcohol    Types: 3 Glasses of wine per week    Comment: social   Drug use: No   Sexual activity: Not on file

## 2022-08-20 ENCOUNTER — Other Ambulatory Visit (HOSPITAL_COMMUNITY): Payer: Self-pay

## 2022-08-20 MED ORDER — AMPICILLIN 500 MG PO CAPS
500.0000 mg | ORAL_CAPSULE | Freq: Two times a day (BID) | ORAL | 2 refills | Status: DC
  Filled 2022-08-20: qty 60, 30d supply, fill #0
  Filled 2022-11-17 – 2022-12-05 (×2): qty 60, 30d supply, fill #1
  Filled 2023-03-19: qty 60, 30d supply, fill #2

## 2022-08-30 ENCOUNTER — Encounter: Payer: Self-pay | Admitting: Family Medicine

## 2022-08-30 ENCOUNTER — Ambulatory Visit
Admission: RE | Admit: 2022-08-30 | Discharge: 2022-08-30 | Disposition: A | Discharge status: Home / Self Care | Payer: 59 | Source: Ambulatory Visit | Attending: Family Medicine | Admitting: Family Medicine

## 2022-08-30 DIAGNOSIS — E2839 Other primary ovarian failure: Secondary | ICD-10-CM

## 2022-08-30 DIAGNOSIS — Z780 Asymptomatic menopausal state: Secondary | ICD-10-CM | POA: Diagnosis not present

## 2022-09-17 ENCOUNTER — Ambulatory Visit (INDEPENDENT_AMBULATORY_CARE_PROVIDER_SITE_OTHER): Payer: 59 | Admitting: Orthopaedic Surgery

## 2022-09-17 ENCOUNTER — Other Ambulatory Visit (HOSPITAL_COMMUNITY): Payer: Self-pay

## 2022-09-17 ENCOUNTER — Ambulatory Visit (INDEPENDENT_AMBULATORY_CARE_PROVIDER_SITE_OTHER): Payer: 59

## 2022-09-17 DIAGNOSIS — M79671 Pain in right foot: Secondary | ICD-10-CM

## 2022-09-17 DIAGNOSIS — M1711 Unilateral primary osteoarthritis, right knee: Secondary | ICD-10-CM | POA: Diagnosis not present

## 2022-09-17 MED ORDER — TRAMADOL HCL 50 MG PO TABS
50.0000 mg | ORAL_TABLET | Freq: Two times a day (BID) | ORAL | 0 refills | Status: DC | PRN
  Filled 2022-09-17: qty 10, 5d supply, fill #0

## 2022-09-17 NOTE — Progress Notes (Signed)
Office Visit Note   Patient: Taylor Cain           Date of Birth: 07-Jan-1961           MRN: [id number] Visit Date: 09/17/2022              Requested by: Darreld Mclean, MD [address] [place],  [place] [address] PCP: Darreld Mclean, MD   Assessment & Plan: Visit Diagnoses:  1. Pain in right foot     Plan: Impression is probable fracture through the fourth toe distal phalanx into the DIP joint was consolidation seen on x-rays today.  Patient is currently approximately 2-1/2 weeks out from injury and has seen no improvement with buddy tape.  I would like to place her into a postoperative shoe weightbearing as tolerated.  She will continue over-the-counter pain medication as needed.  I sent in tramadol to take at night for breakthrough pain.  She will follow-up with [place] in 3 weeks for recheck.  Call with concerns or questions in the meantime.  Patient was independently seen by me today.  Follow-Up Instructions: Return in about 3 weeks (around 10/08/2022).   Orders:  Orders Placed This Encounter  Procedures   XR Foot Complete Right   Meds ordered this encounter  Medications   traMADol (ULTRAM) 50 MG tablet    Sig: Take 1 tablet (50 mg total) by mouth every 12 (twelve) hours as needed.    Dispense:  10 tablet    Refill:  0      Procedures: No procedures performed   Clinical Data: No additional findings.   Subjective: Chief Complaint  Patient presents with   Right Foot - Pain    HPI patient is a pleasant 61 year old female who comes in today following an injury to her right foot fourth toe.  On 08/30/2022, she rolled her foot injuring the fourth toe.  She initially had pain, swelling and bruising.  The bruising has since resolved but she continues to have pain which is actually worsened.  The pain is to the entire fourth toe and occurs with weightbearing as well as any activity and occasionally at night.  She is tried over-the-counter pain medication  without significant relief.  She does take vitamin D.  She tells me she has tried to wear a stiff shoe such as a Data processing manager which initially helped.  Review of Systems as detailed in HPI.  All others reviewed and are negative.   Objective: Vital Signs: LMP 01/28/2014   Physical Exam well-developed and well-nourished female in no acute distress.  Alert and oriented x 3.  Ortho Exam right foot exam shows tenderness throughout the fourth toe.  Slight swelling.  She is neurovascularly intact distally.  Specialty Comments:  No specialty comments available.  Imaging: XR Foot Complete Right  Result Date: 09/17/2022 X-rays demonstrate what appears to be bony consolidation to the DIP joint of the fourth phalanx    PMFS History: Patient Active Problem List   Diagnosis Date Noted   Acute medial meniscus tear of right knee 08/16/2021   Encounter for counseling 07/06/2019   Low back strain, initial encounter 05/23/2017   Acne vulgaris 01/29/2013   Iron deficiency anemia 01/29/2013   Migraine without aura 10/15/2007   PREMATURE VENTRICULAR CONTRACTIONS 10/15/2007   GERD 10/15/2007   Past Medical History:  Diagnosis Date   Anemia    Arthritis    right shoulder   Heart palpitations  made aware when patient was in 20's-30's; states they are very infrequent and pt. remains asymptomatic   History of pneumonia    Seasonal allergies     Family History  Problem Relation Age of Onset   Migraines Mother    Hypertension Mother    Hypertension Maternal Grandmother    Alzheimer's disease Maternal Grandmother    Diabetes Maternal Grandfather    Colon cancer Neg Hx     Past Surgical History:  Procedure Laterality Date   COLONOSCOPY     KNEE ARTHROSCOPY Right 1990, 1992   torn acl   KNEE ARTHROSCOPY WITH MENISCAL REPAIR Right 09/26/2021   Procedure: RIGHT KNEE ARTHROSCOPY WITH PARTIAL MEDIAL MENISCECTOMY AND CHONDROPLASTY MEDIAL FEMORAL CONDYLE AND TROCHLEA;  Surgeon: Leandrew Koyanagi,  MD;  Location: [facility];  Service: Orthopedics;  Laterality: Right;   SHOULDER ARTHROSCOPY WITH ROTATOR CUFF REPAIR Right 01/02/2017   Procedure: RIGHT SHOULDER ARTHROSCOPY WITH REVISION OF ROTATOR CUFF REPAIR;  Surgeon: Justice Britain, MD;  Location: [facility];  Service: Orthopedics;  Laterality: Right;   SHOULDER ARTHROSCOPY WITH ROTATOR CUFF REPAIR AND SUBACROMIAL DECOMPRESSION Right 07/25/2016   Procedure: RIGHT SHOULDER ARTHROSCOPY WITH ROTATOR CUFF REPAIR AND SUBACROMIAL DECOMPRESSION, DISTAL CLAVICLE RESECTION;  Surgeon: Justice Britain, MD;  Location: [facility];  Service: Orthopedics;  Laterality: Right;   Social History   Occupational History   Occupation: DIRECTOR    Employer: [employer]  Tobacco Use   Smoking status: Never    Passive exposure: Never   Smokeless tobacco: Never  Substance and Sexual Activity   Alcohol use: Yes    Alcohol/week: 3.0 standard drinks of alcohol    Types: 3 Glasses of wine per week    Comment: social   Drug use: No   Sexual activity: Not on file

## 2022-09-20 ENCOUNTER — Other Ambulatory Visit (HOSPITAL_COMMUNITY): Payer: Self-pay

## 2022-09-25 ENCOUNTER — Other Ambulatory Visit (HOSPITAL_COMMUNITY): Payer: Self-pay

## 2022-10-08 ENCOUNTER — Ambulatory Visit (INDEPENDENT_AMBULATORY_CARE_PROVIDER_SITE_OTHER): Payer: 59 | Admitting: Orthopaedic Surgery

## 2022-10-08 ENCOUNTER — Ambulatory Visit (INDEPENDENT_AMBULATORY_CARE_PROVIDER_SITE_OTHER): Payer: 59

## 2022-10-08 ENCOUNTER — Encounter: Payer: Self-pay | Admitting: Orthopaedic Surgery

## 2022-10-08 DIAGNOSIS — M79671 Pain in right foot: Secondary | ICD-10-CM

## 2022-10-08 NOTE — Progress Notes (Signed)
Office Visit Note   Patient: Taylor Cain           Date of Birth: [birth year]           MRN: [id number] Visit Date: 10/08/2022              Requested by: Taylor Mclean, MD [address] [place],  [place] [address] PCP: Taylor Mclean, MD   Assessment & Plan: Visit Diagnoses:  1. Pain in right foot     Plan: Impression is healing right fourth toe fracture.  She will continue to buddy tape and splint as needed.  We talked about transitioning from postop shoe to a carbon footplate insert in a regular shoe as her symptoms allow.  Recheck in 6 weeks with 2 view x-rays of the right fourth toe.  Follow-Up Instructions: Return in about 6 weeks (around 11/19/2022).   Orders:  Orders Placed This Encounter  Procedures   XR Foot Complete Right   No orders of the defined types were placed in this encounter.     Procedures: No procedures performed   Clinical Data: No additional findings.   Subjective: Chief Complaint  Patient presents with   Right Foot - Pain, Follow-up    HPI Taylor Cain follows up for right fourth toe fracture.  She is about 5 weeks from the injury.  Continues to have swelling in the toe and feels better with splinting it. Review of Systems   Objective: Vital Signs: LMP 01/28/2014   Physical Exam  Ortho Exam Examination of the right fourth toe shows residual mild swelling without any neurovascular compromise.  Clinically the toe is well aligned.  I do not feel any gross motion through the fracture.  Slight tenderness to palpation. Specialty Comments:  No specialty comments available.  Imaging: XR Foot Complete Right  Result Date: 10/08/2022 Fracture appears to be consolidating    PMFS History: Patient Active Problem List   Diagnosis Date Noted   Acute medial meniscus tear of right knee 08/16/2021   Encounter for counseling 07/06/2019   Low back strain, initial encounter 05/23/2017   Acne vulgaris 01/29/2013   Iron  deficiency anemia 01/29/2013   Migraine without aura 10/15/2007   PREMATURE VENTRICULAR CONTRACTIONS 10/15/2007   GERD 10/15/2007   Past Medical History:  Diagnosis Date   Anemia    Arthritis    right shoulder   Heart palpitations    made aware when patient was in 20's-30's; states they are very infrequent and pt. remains asymptomatic   History of pneumonia    Seasonal allergies     Family History  Problem Relation Age of Onset   Migraines Mother    Hypertension Mother    Hypertension Maternal Grandmother    Alzheimer's disease Maternal Grandmother    Diabetes Maternal Grandfather    Colon cancer Neg Hx     Past Surgical History:  Procedure Laterality Date   COLONOSCOPY     KNEE ARTHROSCOPY Right 1990, 1992   torn acl   KNEE ARTHROSCOPY WITH MENISCAL REPAIR Right 09/26/2021   Procedure: RIGHT KNEE ARTHROSCOPY WITH PARTIAL MEDIAL MENISCECTOMY AND CHONDROPLASTY MEDIAL FEMORAL CONDYLE AND TROCHLEA;  Surgeon: Leandrew Koyanagi, MD;  Location: [facility];  Service: Orthopedics;  Laterality: Right;   SHOULDER ARTHROSCOPY WITH ROTATOR CUFF REPAIR Right 01/02/2017   Procedure: RIGHT SHOULDER ARTHROSCOPY WITH REVISION OF ROTATOR CUFF REPAIR;  Surgeon: Justice Britain, MD;  Location: [facility];  Service: Orthopedics;  Laterality:  Right;   SHOULDER ARTHROSCOPY WITH ROTATOR CUFF REPAIR AND SUBACROMIAL DECOMPRESSION Right 07/25/2016   Procedure: RIGHT SHOULDER ARTHROSCOPY WITH ROTATOR CUFF REPAIR AND SUBACROMIAL DECOMPRESSION, DISTAL CLAVICLE RESECTION;  Surgeon: Justice Britain, MD;  Location: [facility];  Service: Orthopedics;  Laterality: Right;   Social History   Occupational History   Occupation: DIRECTOR    Employer: [employer]  Tobacco Use   Smoking status: Never    Passive exposure: Never   Smokeless tobacco: Never  Substance and Sexual Activity   Alcohol use: Yes    Alcohol/week: 3.0 standard drinks of alcohol    Types: 3 Glasses of wine per week    Comment: social   Drug  use: No   Sexual activity: Not on file

## 2022-10-10 DIAGNOSIS — H43812 Vitreous degeneration, left eye: Secondary | ICD-10-CM | POA: Diagnosis not present

## 2022-10-24 DIAGNOSIS — H43812 Vitreous degeneration, left eye: Secondary | ICD-10-CM | POA: Diagnosis not present

## 2022-11-12 NOTE — Patient Instructions (Incomplete)
It was great to see you again today, I will be in touch with your lab work. Recommend COVID booster if needed, dose of RSV  Keep up the good work!

## 2022-11-12 NOTE — Progress Notes (Unsigned)
[facility] [address], [place], [place] [address] [phone]  Date:  11/13/2022   Name:  Taylor Cain   DOB:  1961-09-07   MRN:  [id number]  PCP:  Darreld Mclean, MD    Chief Complaint: No chief complaint on file.   History of Present Illness:  Taylor Cain is a 62 y.o. 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 She does not have celiac disease but gluten causes headaches so she avoids it  Flu vaccine Recommend COVID booster Mammogram up-to-date Pap up-to-date Colonoscopy up-to-date Most recent lab work 1 year ago, can update today DEXA scan 12/23, normal  Can offer CT coronary calcium for screening  Patient Active Problem List   Diagnosis Date Noted   Acute medial meniscus tear of right knee 08/16/2021   Encounter for counseling 07/06/2019   Low back strain, initial encounter 05/23/2017   Acne vulgaris 01/29/2013   Iron deficiency anemia 01/29/2013   Migraine without aura 10/15/2007   PREMATURE VENTRICULAR CONTRACTIONS 10/15/2007   GERD 10/15/2007    Past Medical History:  Diagnosis Date   Anemia    Arthritis    right shoulder   Heart palpitations    made aware when patient was in 20's-30's; states they are very infrequent and pt. remains asymptomatic   History of pneumonia    Seasonal allergies     Past Surgical History:  Procedure Laterality Date   COLONOSCOPY     KNEE ARTHROSCOPY Right 1990, 1992   torn acl   KNEE ARTHROSCOPY WITH MENISCAL REPAIR Right 09/26/2021   Procedure: RIGHT KNEE ARTHROSCOPY WITH PARTIAL MEDIAL MENISCECTOMY AND CHONDROPLASTY MEDIAL FEMORAL CONDYLE AND TROCHLEA;  Surgeon: Leandrew Koyanagi, MD;  Location: [facility];   Service: Orthopedics;  Laterality: Right;   SHOULDER ARTHROSCOPY WITH ROTATOR CUFF REPAIR Right 01/02/2017   Procedure: RIGHT SHOULDER ARTHROSCOPY WITH REVISION OF ROTATOR CUFF REPAIR;  Surgeon: Justice Britain, MD;  Location: [facility];  Service: Orthopedics;  Laterality: Right;   SHOULDER ARTHROSCOPY WITH ROTATOR CUFF REPAIR AND SUBACROMIAL DECOMPRESSION Right 07/25/2016   Procedure: RIGHT SHOULDER ARTHROSCOPY WITH ROTATOR CUFF REPAIR AND SUBACROMIAL DECOMPRESSION, DISTAL CLAVICLE RESECTION;  Surgeon: Justice Britain, MD;  Location: [facility];  Service: Orthopedics;  Laterality: Right;    Social History   Tobacco Use   Smoking status: Never    Passive exposure: Never   Smokeless tobacco: Never  Substance Use Topics   Alcohol use: Yes    Alcohol/week: 3.0 standard drinks of alcohol    Types: 3 Glasses of wine per week    Comment: social   Drug use: No    Family History  Problem Relation Age of Onset   Migraines Mother    Hypertension Mother    Hypertension Maternal Grandmother    Alzheimer's disease Maternal Grandmother    Diabetes Maternal Grandfather    Colon cancer Neg Hx     Allergies  Allergen Reactions   Acetaminophen-Codeine Anxiety    "patien becomes antsy/anxious"    Medication list has been reviewed and updated.  Current Outpatient Medications on File Prior to Visit  Medication Sig Dispense Refill   ampicillin (PRINCIPEN) 500 MG capsule Take 1 capsule (500 mg total)  by mouth 2 (two) times daily. 60 capsule 2   Cholecalciferol 50 MCG (2000 UT) CAPS Take 2,000 Units by mouth daily.      clindamycin (CLINDAGEL) 1 % gel APPLY TO THE AFFECTED AREA(S) ON THE SKIN TWICE A DAY FOR ROSACEA 60 g 2   multivitamin-lutein (OCUVITE-LUTEIN) CAPS capsule Take 1 capsule by mouth daily.     traMADol (ULTRAM) 50 MG tablet Take 1 tablet (50 mg total) by mouth every 12 (twelve) hours as needed. 10 tablet 0   triamcinolone (KENALOG) 0.1 % paste Place a small amount to the affected area 2 - 3 times  daily after meals 1 g 12   No current facility-administered medications on file prior to visit.    Review of Systems:  As per HPI- otherwise negative.   Physical Examination: There were no vitals filed for this visit. There were no vitals filed for this visit. There is no height or weight on file to calculate BMI. Ideal Body Weight:    GEN: no acute distress. HEENT: Atraumatic, Normocephalic.  Ears and Nose: No external deformity. CV: RRR, No M/G/R. No JVD. No thrill. No extra heart sounds. PULM: CTA B, no wheezes, crackles, rhonchi. No retractions. No resp. distress. No accessory muscle use. ABD: S, NT, ND, +BS. No rebound. No HSM. EXTR: No c/c/e PSYCH: Normally interactive. Conversant.    Assessment and Plan: ***  Signed Lamar Blinks, MD

## 2022-11-13 ENCOUNTER — Ambulatory Visit (INDEPENDENT_AMBULATORY_CARE_PROVIDER_SITE_OTHER): Payer: 59 | Admitting: Family Medicine

## 2022-11-13 ENCOUNTER — Encounter: Payer: Self-pay | Admitting: Family Medicine

## 2022-11-13 VITALS — BP 120/72 | HR 64 | Ht 66.5 in | Wt 147.8 lb

## 2022-11-13 DIAGNOSIS — R5383 Other fatigue: Secondary | ICD-10-CM | POA: Diagnosis not present

## 2022-11-13 DIAGNOSIS — Z131 Encounter for screening for diabetes mellitus: Secondary | ICD-10-CM

## 2022-11-13 DIAGNOSIS — Z13220 Encounter for screening for lipoid disorders: Secondary | ICD-10-CM | POA: Diagnosis not present

## 2022-11-13 DIAGNOSIS — Z0000 Encounter for general adult medical examination without abnormal findings: Secondary | ICD-10-CM

## 2022-11-13 DIAGNOSIS — Z1329 Encounter for screening for other suspected endocrine disorder: Secondary | ICD-10-CM

## 2022-11-13 DIAGNOSIS — D508 Other iron deficiency anemias: Secondary | ICD-10-CM | POA: Diagnosis not present

## 2022-11-14 ENCOUNTER — Encounter: Payer: Self-pay | Admitting: Family Medicine

## 2022-11-14 LAB — FERRITIN: Ferritin: 77.8 ng/mL (ref 10.0–291.0)

## 2022-11-14 LAB — COMPREHENSIVE METABOLIC PANEL
ALT: 13 U/L (ref 0–35)
AST: 22 U/L (ref 0–37)
Albumin: 4.5 g/dL (ref 3.5–5.2)
Alkaline Phosphatase: 47 U/L (ref 39–117)
BUN: 17 mg/dL (ref 6–23)
CO2: 32 mEq/L (ref 19–32)
Calcium: 9.6 mg/dL (ref 8.4–10.5)
Chloride: 97 mEq/L (ref 96–112)
Creatinine, Ser: 0.91 mg/dL (ref 0.40–1.20)
GFR: 66.98 mL/min (ref >60.00)
Glucose, Bld: 85 mg/dL (ref 70–99)
Potassium: 4.3 mEq/L (ref 3.5–5.1)
Sodium: 136 mEq/L (ref 135–145)
Total Bilirubin: 0.5 mg/dL (ref 0.2–1.2)
Total Protein: 7 g/dL (ref 6.0–8.3)

## 2022-11-14 LAB — VITAMIN D 25 HYDROXY (VIT D DEFICIENCY, FRACTURES): VITD: 60.7 ng/mL (ref 30.00–100.00)

## 2022-11-14 LAB — CBC
HCT: 39.3 % (ref 36.0–46.0)
Hemoglobin: 13.2 g/dL (ref 12.0–15.0)
MCHC: 33.5 g/dL (ref 30.0–36.0)
MCV: 93.4 fl (ref 78.0–100.0)
Platelets: 198 10*3/uL (ref 150.0–400.0)
RBC: 4.21 Mil/uL (ref 3.87–5.11)
RDW: 13.8 % (ref 11.5–15.5)
WBC: 4.7 10*3/uL (ref 4.0–10.5)

## 2022-11-14 LAB — LIPID PANEL
Cholesterol: 203 mg/dL — ABNORMAL HIGH (ref 0–200)
HDL: 73.3 mg/dL (ref >39.00)
LDL Cholesterol: 111 mg/dL — ABNORMAL HIGH (ref 0–99)
NonHDL: 129.65
Total CHOL/HDL Ratio: 3
Triglycerides: 95 mg/dL (ref 0.0–149.0)
VLDL: 19 mg/dL (ref 0.0–40.0)

## 2022-11-14 LAB — HEMOGLOBIN A1C: Hgb A1c MFr Bld: 5.7 % (ref 4.6–6.5)

## 2022-11-14 LAB — TSH: TSH: 1.68 u[IU]/mL (ref 0.35–5.50)

## 2022-11-18 ENCOUNTER — Other Ambulatory Visit (HOSPITAL_COMMUNITY): Payer: Self-pay

## 2022-11-18 ENCOUNTER — Other Ambulatory Visit: Payer: Self-pay

## 2022-11-18 ENCOUNTER — Encounter (HOSPITAL_COMMUNITY): Payer: Self-pay

## 2022-11-18 MED ORDER — TRIAMCINOLONE ACETONIDE 0.1 % MT PSTE
PASTE | OROMUCOSAL | 12 refills | Status: DC
  Filled 2022-11-18: qty 5, 30d supply, fill #0
  Filled 2022-12-05: qty 5, 10d supply, fill #0

## 2022-11-19 ENCOUNTER — Other Ambulatory Visit: Payer: Self-pay

## 2022-11-19 ENCOUNTER — Encounter (HOSPITAL_COMMUNITY): Payer: Self-pay

## 2022-11-19 ENCOUNTER — Other Ambulatory Visit (HOSPITAL_COMMUNITY): Payer: Self-pay

## 2022-11-19 ENCOUNTER — Ambulatory Visit: Payer: 59 | Admitting: Orthopaedic Surgery

## 2022-11-20 ENCOUNTER — Other Ambulatory Visit: Payer: Self-pay

## 2022-11-20 ENCOUNTER — Other Ambulatory Visit (HOSPITAL_COMMUNITY): Payer: Self-pay

## 2022-11-21 ENCOUNTER — Other Ambulatory Visit (HOSPITAL_COMMUNITY): Payer: Self-pay

## 2022-11-22 ENCOUNTER — Other Ambulatory Visit: Payer: Self-pay

## 2022-12-05 ENCOUNTER — Other Ambulatory Visit (HOSPITAL_COMMUNITY): Payer: Self-pay

## 2022-12-05 ENCOUNTER — Other Ambulatory Visit: Payer: Self-pay

## 2022-12-20 ENCOUNTER — Other Ambulatory Visit: Payer: Self-pay

## 2022-12-20 MED ORDER — COVID-19 MRNA VAC-TRIS(PFIZER) 30 MCG/0.3ML IM SUSY
0.3000 mL | PREFILLED_SYRINGE | Freq: Once | INTRAMUSCULAR | 0 refills | Status: AC
  Filled 2022-12-20: qty 0.3, 1d supply, fill #0

## 2023-01-27 ENCOUNTER — Ambulatory Visit: Payer: 59 | Admitting: Family Medicine

## 2023-01-27 ENCOUNTER — Ambulatory Visit (HOSPITAL_BASED_OUTPATIENT_CLINIC_OR_DEPARTMENT_OTHER)
Admission: RE | Admit: 2023-01-27 | Discharge: 2023-01-27 | Disposition: A | Discharge status: Home / Self Care | Payer: 59 | Source: Ambulatory Visit | Attending: Family Medicine | Admitting: Family Medicine

## 2023-01-27 ENCOUNTER — Other Ambulatory Visit (HOSPITAL_BASED_OUTPATIENT_CLINIC_OR_DEPARTMENT_OTHER): Payer: Self-pay

## 2023-01-27 ENCOUNTER — Encounter: Payer: Self-pay | Admitting: Family Medicine

## 2023-01-27 VITALS — BP 120/78 | HR 63 | Temp 98.5°F | Resp 16 | Ht 65.5 in | Wt 151.8 lb

## 2023-01-27 DIAGNOSIS — R079 Chest pain, unspecified: Secondary | ICD-10-CM | POA: Diagnosis not present

## 2023-01-27 DIAGNOSIS — R059 Cough, unspecified: Secondary | ICD-10-CM | POA: Diagnosis not present

## 2023-01-27 DIAGNOSIS — M546 Pain in thoracic spine: Secondary | ICD-10-CM | POA: Diagnosis not present

## 2023-01-27 MED ORDER — CYCLOBENZAPRINE HCL 10 MG PO TABS
10.0000 mg | ORAL_TABLET | Freq: Three times a day (TID) | ORAL | 0 refills | Status: DC | PRN
  Filled 2023-01-27: qty 30, 10d supply, fill #0

## 2023-01-27 MED ORDER — MELOXICAM 7.5 MG PO TABS
7.5000 mg | ORAL_TABLET | Freq: Every day | ORAL | 0 refills | Status: DC
  Filled 2023-01-27: qty 60, 30d supply, fill #0

## 2023-01-27 NOTE — Assessment & Plan Note (Signed)
Muscle relaxer and meloxicam 7.5 mg  Check cxr EKG---nsr

## 2023-01-27 NOTE — Progress Notes (Signed)
Subjective:   By signing my name below, I, Taylor Cain, attest that this documentation has been prepared under the direction and in the presence of Donato Schultz, DO 01/27/23   Patient ID: Taylor Cain, female    DOB: [birth year], 62 y.o.   MRN: [id number]  Chief Complaint  Patient presents with   Shoulder Pain    Sxs started over the weekend, pain between shoulder blades. No pain with movement but pain with taking deep breathes. Pt states upper right quad pain.     HPI Patient is in today for an office visit.   She complains of pain between her shoulder blades starting Saturday. She notes pain radiates to her right abdomen. She reports pain worsened last night. Pain has not alleviated with Tylenol.   She states she can't replicate the pain with movement. She tends to feel pain with deep respirations and it is constant when sitting/standing upright.  She did an EKG with her apple watch, which showed no abnormalities. Denies any chest pain.   Past Medical History:  Diagnosis Date   Anemia    Arthritis    right shoulder   Heart palpitations    made aware when patient was in 20's-30's; states they are very infrequent and pt. remains asymptomatic   History of pneumonia    Seasonal allergies     Past Surgical History:  Procedure Laterality Date   COLONOSCOPY     KNEE ARTHROSCOPY Right 1990, 1992   torn acl   KNEE ARTHROSCOPY WITH MENISCAL REPAIR Right 09/26/2021   Procedure: RIGHT KNEE ARTHROSCOPY WITH PARTIAL MEDIAL MENISCECTOMY AND CHONDROPLASTY MEDIAL FEMORAL CONDYLE AND TROCHLEA;  Surgeon: Tarry Kos, MD;  Location: [facility];  Service: Orthopedics;  Laterality: Right;   SHOULDER ARTHROSCOPY WITH ROTATOR CUFF REPAIR Right 01/02/2017   Procedure: RIGHT SHOULDER ARTHROSCOPY WITH REVISION OF ROTATOR CUFF REPAIR;  Surgeon: Francena Hanly, MD;  Location: [facility];  Service: Orthopedics;  Laterality: Right;   SHOULDER ARTHROSCOPY WITH ROTATOR CUFF REPAIR AND  SUBACROMIAL DECOMPRESSION Right 07/25/2016   Procedure: RIGHT SHOULDER ARTHROSCOPY WITH ROTATOR CUFF REPAIR AND SUBACROMIAL DECOMPRESSION, DISTAL CLAVICLE RESECTION;  Surgeon: Francena Hanly, MD;  Location: [facility];  Service: Orthopedics;  Laterality: Right;    Family History  Problem Relation Age of Onset   Migraines Mother    Hypertension Mother    Hypertension Maternal Grandmother    Alzheimer's disease Maternal Grandmother    Diabetes Maternal Grandfather    Colon cancer Neg Hx     Social History   Socioeconomic History   Marital status: Married    Spouse name: Not on file   Number of children: Not on file   Years of education: Not on file   Highest education level: Not on file  Occupational History   Occupation: DIRECTOR    Employer: [employer]  Tobacco Use   Smoking status: Never    Passive exposure: Never   Smokeless tobacco: Never  Substance and Sexual Activity   Alcohol use: Yes    Alcohol/week: 3.0 standard drinks of alcohol    Types: 3 Glasses of wine per week    Comment: social   Drug use: No   Sexual activity: Not on file  Other Topics Concern   Not on file  Social History Narrative   No regular exercise   Eat fruits and veggies, no fast food, lean meats   Social Determinants of Health   Financial Resource Strain: Not on file  Food Insecurity: Not on file  Transportation Needs: Not on file  Physical Activity: Not on file  Stress: Not on file  Social Connections: Not on file  Intimate Partner Violence: Not on file    Outpatient Medications Prior to Visit  Medication Sig Dispense Refill   ampicillin (PRINCIPEN) 500 MG capsule Take 1 capsule (500 mg total) by mouth 2 (two) times daily. 60 capsule 2   Cholecalciferol 50 MCG (2000 UT) CAPS Take 2,000 Units by mouth daily.      clindamycin (CLINDAGEL) 1 % gel APPLY TO THE AFFECTED AREA(S) ON THE SKIN TWICE A DAY FOR ROSACEA 60 g 2   multivitamin-lutein (OCUVITE-LUTEIN) CAPS capsule Take 1 capsule by mouth  daily.     triamcinolone (KENALOG) 0.1 % paste Place a small amount to the affected area 2 - 3 times daily after meals 5 g 12   traMADol (ULTRAM) 50 MG tablet Take 1 tablet (50 mg total) by mouth every 12 (twelve) hours as needed. (Patient not taking: Reported on 11/13/2022) 10 tablet 0   No facility-administered medications prior to visit.    Allergies  Allergen Reactions   Acetaminophen-Codeine Anxiety    "patien becomes antsy/anxious"    Review of Systems  Constitutional:  Negative for fever and malaise/fatigue.  HENT:  Negative for congestion.   Eyes:  Negative for blurred vision.  Respiratory:  Negative for shortness of breath.   Cardiovascular:  Negative for chest pain, palpitations and leg swelling.  Gastrointestinal:  Negative for abdominal pain, blood in stool and nausea.  Genitourinary:  Negative for dysuria and frequency.  Musculoskeletal:  Positive for back pain (between shoulder blades). Negative for falls.  Skin:  Negative for rash.  Neurological:  Negative for dizziness, loss of consciousness and headaches.  Endo/Heme/Allergies:  Negative for environmental allergies.  Psychiatric/Behavioral:  Negative for depression. The patient is not nervous/anxious.        Objective:    Physical Exam Vitals and nursing note reviewed.  Constitutional:      Appearance: She is well-developed.  HENT:     Head: Normocephalic and atraumatic.  Eyes:     Conjunctiva/sclera: Conjunctivae normal.  Neck:     Thyroid: No thyromegaly.     Vascular: No carotid bruit or JVD.  Cardiovascular:     Rate and Rhythm: Normal rate and regular rhythm.     Heart sounds: Normal heart sounds. No murmur heard. Pulmonary:     Effort: Pulmonary effort is normal. No respiratory distress.     Breath sounds: Normal breath sounds. No wheezing or rales.  Chest:     Chest wall: No tenderness.  Musculoskeletal:        General: Tenderness present.     Cervical back: Normal range of motion and neck  supple.     Comments: Right upper back tenderness Paraspinal with muscle spasms.  Neurological:     General: No focal deficit present.     Mental Status: She is alert and oriented to person, place, and time.     BP 120/78 (BP Location: Left Arm, Patient Position: Sitting, Cuff Size: Normal)   Pulse 63   Temp 98.5 F (36.9 C) (Oral)   Resp 16   Ht 5' 5.5" (1.664 m)   Wt 151 lb 12.8 oz (68.9 kg)   LMP 01/28/2014   SpO2 99%   BMI 24.88 kg/m  Wt Readings from Last 3 Encounters:  01/27/23 151 lb 12.8 oz (68.9 kg)  11/13/22 147 lb 12.8 oz (67 kg)  11/12/21 148 lb (67.1 kg)       Assessment & Plan:  Chest pain, unspecified type -     EKG 12-Lead -     DG Chest 2 View; Future  Acute right-sided thoracic back pain Assessment & Plan: Muscle relaxer and meloxicam 7.5 mg  Check cxr EKG---nsr   Orders: -     Meloxicam; Take 1-2 tablets (7.5-15 mg total) by mouth daily.  Dispense: 60 tablet; Refill: 0 -     Cyclobenzaprine HCl; Take 1 tablet (10 mg total) by mouth 3 (three) times daily as needed for muscle spasms.  Dispense: 30 tablet; Refill: 0     I,Rachel Rivera,acting as a scribe for Donato Schultz, DO.,have documented all relevant documentation on the behalf of Donato Schultz, DO,as directed by  Donato Schultz, DO while in the presence of Donato Schultz, DO.   I, Donato Schultz, DO, personally preformed the services described in this documentation.  All medical record entries made by the scribe were at my direction and in my presence.  I have reviewed the chart and discharge instructions (if applicable) and agree that the record reflects my personal performance and is accurate and complete. 01/27/23   Donato Schultz, DO

## 2023-01-29 DIAGNOSIS — H353131 Nonexudative age-related macular degeneration, bilateral, early dry stage: Secondary | ICD-10-CM | POA: Diagnosis not present

## 2023-01-29 DIAGNOSIS — H0288A Meibomian gland dysfunction right eye, upper and lower eyelids: Secondary | ICD-10-CM | POA: Diagnosis not present

## 2023-01-29 DIAGNOSIS — H0288B Meibomian gland dysfunction left eye, upper and lower eyelids: Secondary | ICD-10-CM | POA: Diagnosis not present

## 2023-03-19 ENCOUNTER — Other Ambulatory Visit (HOSPITAL_COMMUNITY): Payer: Self-pay

## 2023-03-19 ENCOUNTER — Other Ambulatory Visit: Payer: Self-pay | Admitting: Family Medicine

## 2023-06-09 ENCOUNTER — Ambulatory Visit: Payer: 59 | Admitting: Physician Assistant

## 2023-06-09 ENCOUNTER — Encounter: Payer: Self-pay | Admitting: Physician Assistant

## 2023-06-09 ENCOUNTER — Other Ambulatory Visit (HOSPITAL_BASED_OUTPATIENT_CLINIC_OR_DEPARTMENT_OTHER): Payer: Self-pay

## 2023-06-09 ENCOUNTER — Other Ambulatory Visit (HOSPITAL_COMMUNITY): Payer: Self-pay

## 2023-06-09 VITALS — BP 136/78 | HR 56 | Temp 97.9°F | Resp 20 | Wt 152.8 lb

## 2023-06-09 DIAGNOSIS — R21 Rash and other nonspecific skin eruption: Secondary | ICD-10-CM | POA: Diagnosis not present

## 2023-06-09 MED ORDER — SULFAMETHOXAZOLE-TRIMETHOPRIM 800-160 MG PO TABS
1.0000 | ORAL_TABLET | Freq: Two times a day (BID) | ORAL | 0 refills | Status: AC
Start: 2023-06-09 — End: 2023-06-16
  Filled 2023-06-09 (×2): qty 14, 7d supply, fill #0

## 2023-06-09 MED ORDER — TRIAMCINOLONE ACETONIDE 0.1 % EX CREA
1.0000 | TOPICAL_CREAM | Freq: Two times a day (BID) | CUTANEOUS | 0 refills | Status: DC
Start: 2023-06-09 — End: 2023-11-17
  Filled 2023-06-09 (×2): qty 30, 15d supply, fill #0

## 2023-06-09 MED ORDER — MUPIROCIN 2 % EX OINT
1.0000 | TOPICAL_OINTMENT | Freq: Two times a day (BID) | CUTANEOUS | 0 refills | Status: DC
Start: 2023-06-09 — End: 2023-11-17
  Filled 2023-06-09 (×2): qty 22, 11d supply, fill #0

## 2023-06-09 NOTE — Progress Notes (Signed)
Established patient visit   Patient: Taylor Cain   DOB: 10/23/1960   62 y.o. Female  MRN: [id number] Visit Date: 06/09/2023  Today's healthcare provider: Alfredia Ferguson, PA-C   Chief Complaint  Patient presents with   Rash    Rash noticed on Friday mainly on arms   Subjective    HPI Discussed the use of AI scribe software for clinical note transcription with the patient, who gave verbal consent to proceed.  History of Present Illness   The patient presents with a recurrent rash that typically presents as a single-few lesion, runs its course, and resolves before another lesion appears. The lesions are primarily located on the arms, with occasional lesions on the torso. Recently, the patient experienced an outbreak of multiple lesions. The lesions are not typically itchy, but occasionally one will itch. The patient has tried treating the lesions with chlorhexidine without success. The frequency of the lesions was previously random, occurring approximately once a month or once every two weeks, but recently the patient experienced an outbreak of multiple lesions.    The patient has a history of a similar rash on the legs, which was initially thought to be bug bites but was later treated as MRSA, which has recurred previously.       Medications: Outpatient Medications Prior to Visit  Medication Sig   ampicillin (PRINCIPEN) 500 MG capsule Take 1 capsule (500 mg total) by mouth 2 (two) times daily.   Cholecalciferol 50 MCG (2000 UT) CAPS Take 2,000 Units by mouth daily.    clindamycin (CLINDAGEL) 1 % gel APPLY TO THE AFFECTED AREA(S) ON THE SKIN TWICE A DAY FOR ROSACEA   multivitamin-lutein (OCUVITE-LUTEIN) CAPS capsule Take 1 capsule by mouth daily.   triamcinolone (KENALOG) 0.1 % paste Place a small amount to the affected area 2 - 3 times daily after meals   [DISCONTINUED] cyclobenzaprine (FLEXERIL) 10 MG tablet Take 1 tablet (10 mg total) by mouth 3 (three) times daily as  needed for muscle spasms. (Patient not taking: Reported on 06/09/2023)   [DISCONTINUED] meloxicam (MOBIC) 7.5 MG tablet Take 1-2 tablets (7.5-15 mg total) by mouth daily. (Patient not taking: Reported on 06/09/2023)   No facility-administered medications prior to visit.    Review of Systems  Constitutional:  Negative for fatigue and fever.  Respiratory:  Negative for cough and shortness of breath.   Cardiovascular:  Negative for chest pain and leg swelling.  Gastrointestinal:  Negative for abdominal pain.  Skin:  Positive for rash.  Neurological:  Negative for dizziness and headaches.      Objective    BP 136/78 (BP Location: Left Arm, Patient Position: Sitting, Cuff Size: Normal)   Pulse (!) 56   Temp 97.9 F (36.6 C)   Resp 20   Wt 152 lb 12.8 oz (69.3 kg)   LMP 01/28/2014   SpO2 98%   BMI 25.04 kg/m   Physical Exam Vitals reviewed.  Constitutional:      Appearance: She is not ill-appearing.  HENT:     Head: Normocephalic.  Eyes:     Conjunctiva/sclera: Conjunctivae normal.  Cardiovascular:     Rate and Rhythm: Normal rate.  Pulmonary:     Effort: Pulmonary effort is normal. No respiratory distress.  Skin:    Comments: Multiple erythematous flat to papular lesions on b/l forearms. They are grouped No lesions on hands or upper arms  Neurological:     General: No focal deficit present.  Mental Status: She is alert and oriented to person, place, and time.  Psychiatric:        Mood and Affect: Mood normal.        Behavior: Behavior normal.     No results found for any visits on 06/09/23.  Assessment & Plan     1. Rash  Past history of similar lesions treated as MRSA. Differential includes MRSA, bug bites, or other dermatological conditions. -Start Bactrim bid for 1 week. -Apply topical steroid as needed for itching. -Apply topical antibiotic cream as needed. -Check for signs of bed bugs at home. -Report back if no improvement or worsening after 1 week of  treatment.       - sulfamethoxazole-trimethoprim (BACTRIM DS) 800-160 MG tablet; Take 1 tablet by mouth 2 (two) times daily for 7 days.  Dispense: 14 tablet; Refill: 0 - triamcinolone cream (KENALOG) 0.1 %; Apply 1 Application topically 2 (two) times daily.  Dispense: 30 g; Refill: 0 - mupirocin ointment (BACTROBAN) 2 %; Apply 1 Application topically 2 (two) times daily.  Dispense: 22 g; Refill: 0   Return if symptoms worsen or fail to improve.      I, Alfredia Ferguson, PA-C have reviewed all documentation for this visit. The documentation on  06/09/23   for the exam, diagnosis, procedures, and orders are all accurate and complete.    Alfredia Ferguson, PA-C  [facility] [phone] (phone) [phone] (fax)  [facility]

## 2023-06-17 ENCOUNTER — Ambulatory Visit (INDEPENDENT_AMBULATORY_CARE_PROVIDER_SITE_OTHER): Payer: Self-pay | Admitting: Plastic Surgery

## 2023-06-17 ENCOUNTER — Encounter: Payer: Self-pay | Admitting: Plastic Surgery

## 2023-06-17 DIAGNOSIS — Z719 Counseling, unspecified: Secondary | ICD-10-CM

## 2023-06-17 NOTE — Progress Notes (Signed)
Botulinum Toxin Procedure Note  Procedure: Cosmetic botulinum toxin   Pre-operative Diagnosis: Dynamic rhytides   Post-operative Diagnosis: Same  Complications:  None  Brief history: The patient desires botulinum toxin injection of her forehead. I discussed with the patient this proposed procedure of botulinum toxin injections, which is customized depending on the particular needs of the patient. It is performed on facial rhytids as a temporary correction. The alternatives were discussed with the patient. The risks were addressed including bleeding, scarring, infection, damage to deeper structures, asymmetry, and chronic pain, which may occur infrequently after a procedure. The individual's choice to undergo a surgical procedure is based on the comparison of risks to potential benefits. Other risks include unsatisfactory results, brow ptosis, eyelid ptosis, allergic reaction, temporary paralysis, which should go away with time, bruising, blurring disturbances and delayed healing. Botulinum toxin injections do not arrest the aging process or produce permanent tightening of the eyelid.  Operative intervention maybe necessary to maintain the results of a blepharoplasty or botulinum toxin. The patient understands and wishes to proceed.  Procedure: The area was prepped with alcohol and dried with a clean gauze. Using a clean technique, the botulinum toxin was diluted with 1.25 cc of preservative-free normal saline which was slowly injected with an 18 gauge needle in a tuberculin syringes.  A 32 gauge needles were then used to inject the botulinum toxin. This mixture allow for an aliquot of 4 units per 0.1 cc in each injection site.    Subsequently the mixture was injected in the glabellar and forehead area with preservation of the temporal branch to the lateral eyebrow as well as into each lateral canthal area beginning from the lateral orbital rim medial to the zygomaticus major in 3 separate areas. A  total of 30 Units of botulinum toxin was used. The forehead and glabellar area was injected with care to inject intramuscular only while holding pressure on the supratrochlear vessels in each area during each injection on either side of the medial corrugators. The injection proceeded vertically superiorly to the medial 2/3 of the frontalis muscle and superior 2/3 of the lateral frontalis, again with preservation of the frontal branch. The area was prepped with alcohol and dried with a clean gauze. The midface area was injected at the medial sub-region of the mid-face with half syringe on each side.  No complications were noted. Light pressure was held for 5 minutes. She was instructed explicitly in post-operative care.   Botox LOT:  [id number]  Voluma 1 ml LOT: [id number]

## 2023-07-15 ENCOUNTER — Other Ambulatory Visit (HOSPITAL_COMMUNITY): Payer: Self-pay

## 2023-07-15 ENCOUNTER — Other Ambulatory Visit: Payer: Self-pay

## 2023-07-15 MED ORDER — AMPICILLIN 500 MG PO CAPS
500.0000 mg | ORAL_CAPSULE | Freq: Two times a day (BID) | ORAL | 2 refills | Status: DC
  Filled 2023-07-15: qty 60, 30d supply, fill #0
  Filled 2023-09-11: qty 60, 30d supply, fill #1
  Filled 2023-10-22: qty 60, 30d supply, fill #2

## 2023-09-12 ENCOUNTER — Other Ambulatory Visit: Payer: Self-pay

## 2023-10-21 DIAGNOSIS — D2261 Melanocytic nevi of right upper limb, including shoulder: Secondary | ICD-10-CM | POA: Diagnosis not present

## 2023-10-21 DIAGNOSIS — D2272 Melanocytic nevi of left lower limb, including hip: Secondary | ICD-10-CM | POA: Diagnosis not present

## 2023-10-21 DIAGNOSIS — L812 Freckles: Secondary | ICD-10-CM | POA: Diagnosis not present

## 2023-10-21 DIAGNOSIS — D225 Melanocytic nevi of trunk: Secondary | ICD-10-CM | POA: Diagnosis not present

## 2023-10-21 DIAGNOSIS — L853 Xerosis cutis: Secondary | ICD-10-CM | POA: Diagnosis not present

## 2023-10-21 DIAGNOSIS — D1801 Hemangioma of skin and subcutaneous tissue: Secondary | ICD-10-CM | POA: Diagnosis not present

## 2023-10-22 ENCOUNTER — Other Ambulatory Visit (HOSPITAL_COMMUNITY): Payer: Self-pay

## 2023-11-05 ENCOUNTER — Other Ambulatory Visit: Payer: Self-pay

## 2023-11-13 NOTE — Patient Instructions (Addendum)
It was good to see you again today!  I will be in touch with your labs Colon due this fall Please set up your mammo Congrats on the new grandson and the new heart center!

## 2023-11-13 NOTE — Progress Notes (Signed)
[facility] [address] [place], [place] [address] [phone]  Date:  11/17/2023   Name:  Taylor Cain   DOB:  1961/03/08   MRN:  [id number]  PCP:  Pearline Cables, MD    Chief Complaint: Annual Exam (Concerns/ questions: /)   History of Present Illness:  Taylor Cain is a 63 y.o. very pleasant female patient who presents with the following:  Pt seen today for CPE- last visit with myself was about one year ago- at that time she was expecting her first grandchild.  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 Russella Dar.  She will get in touch with GI and set this up Pap 2021- negative- update today  Labs can be updated We discussed RSV, no definite indication for this patient.  She will delay DEXA scan 12/23, normal Shingrix is complete   Patient Active Problem List   Diagnosis Date Noted   Acute right-sided thoracic back pain 01/27/2023   Chest pain 01/27/2023   Acute medial meniscus tear of right knee 08/16/2021   Encounter for counseling 07/06/2019   Low back strain, initial encounter 05/23/2017   Acne vulgaris 01/29/2013   Iron deficiency anemia 01/29/2013   Migraine without aura 10/15/2007   PREMATURE VENTRICULAR CONTRACTIONS 10/15/2007   GERD 10/15/2007    Past Medical History:  Diagnosis Date   Anemia    Arthritis    right shoulder   Heart palpitations    made aware when patient was in 20's-30's; states they are very infrequent and pt. remains asymptomatic   History of pneumonia    Seasonal allergies     Past Surgical History:  Procedure Laterality  Date   COLONOSCOPY     KNEE ARTHROSCOPY Right 1990, 1992   torn acl   KNEE ARTHROSCOPY WITH MENISCAL REPAIR Right 09/26/2021   Procedure: RIGHT KNEE ARTHROSCOPY WITH PARTIAL MEDIAL MENISCECTOMY AND CHONDROPLASTY MEDIAL FEMORAL CONDYLE AND TROCHLEA;  Surgeon: Tarry Kos, MD;  Location: [facility];  Service: Orthopedics;  Laterality: Right;   SHOULDER ARTHROSCOPY WITH ROTATOR CUFF REPAIR Right 01/02/2017   Procedure: RIGHT SHOULDER ARTHROSCOPY WITH REVISION OF ROTATOR CUFF REPAIR;  Surgeon: Francena Hanly, MD;  Location: [facility];  Service: Orthopedics;  Laterality: Right;   SHOULDER ARTHROSCOPY WITH ROTATOR CUFF REPAIR AND SUBACROMIAL DECOMPRESSION Right 07/25/2016   Procedure: RIGHT SHOULDER ARTHROSCOPY WITH ROTATOR CUFF REPAIR AND SUBACROMIAL DECOMPRESSION, DISTAL CLAVICLE RESECTION;  Surgeon: Francena Hanly, MD;  Location: [facility];  Service: Orthopedics;  Laterality: Right;    Social History   Tobacco Use   Smoking status: Never    Passive exposure: Never   Smokeless tobacco: Never  Substance Use Topics   Alcohol use: Yes    Alcohol/week: 3.0 standard drinks of alcohol    Types: 3 Glasses of wine per week    Comment: social   Drug use: No    Family History  Problem Relation Age of Onset   Migraines Mother    Hypertension Mother    Hypertension Maternal Grandmother    Alzheimer's disease Maternal Grandmother  Diabetes Maternal Grandfather    Colon cancer Neg Hx     Allergies  Allergen Reactions   Acetaminophen-Codeine Anxiety    "patien becomes antsy/anxious"    Medication list has been reviewed and updated.  Current Outpatient Medications on File Prior to Visit  Medication Sig Dispense Refill   ampicillin (PRINCIPEN) 500 MG capsule Take 1 capsule (500 mg total) by mouth 2 (two) times daily. 60 capsule 2   Cholecalciferol 50 MCG (2000 UT) CAPS Take 2,000 Units by mouth daily.      clindamycin (CLINDAGEL) 1 % gel APPLY TO THE AFFECTED AREA(S) ON THE SKIN TWICE  A DAY FOR ROSACEA 60 g 2   multivitamin-lutein (OCUVITE-LUTEIN) CAPS capsule Take 1 capsule by mouth daily.     No current facility-administered medications on file prior to visit.    Review of Systems:  As per HPI- otherwise negative.   Physical Examination: Vitals:   11/17/23 1531  BP: 122/70  Pulse: 63  Resp: 18  Temp: 98.3 F (36.8 C)  SpO2: 97%   Vitals:   11/17/23 1531  Weight: 153 lb 9.6 oz (69.7 kg)  Height: 5' 5.5" (1.664 m)   Body mass index is 25.17 kg/m. Ideal Body Weight: Weight in (lb) to have BMI = 25: 152.2  GEN: no acute distress. Normal weight, looks well  HEENT: Atraumatic, Normocephalic. TM wnl bilatearlly  Ears and Nose: No external deformity. CV: RRR, No M/G/R. No JVD. No thrill. No extra heart sounds. PULM: CTA B, no wheezes, crackles, rhonchi. No retractions. No resp. distress. No accessory muscle use. ABD: S, NT, ND, +BS. No rebound. No HSM. EXTR: No c/c/e PSYCH: Normally interactive. Conversant.  Pap collected today- small benign appearing polyp in center of cervix, all OW normal vulva, vagina, cervix, bimanual exam  Assessment and Plan: Physical exam  Thyroid disorder screening - Plan: TSH  Screening for diabetes mellitus - Plan: Comprehensive metabolic panel, Hemoglobin A1c  Screening for hyperlipidemia - Plan: Lipid panel  Iron deficiency anemia secondary to inadequate dietary iron intake - Plan: CBC  Screening for cervical cancer - Plan: Cytology - PAP   Physical exam today- encouraged healthy diet and exercise routine  Will plan further follow- up pending labs. Pap smear completed She plans to schedule her mammogram and colon cancer screening  Signed Abbe Amsterdam, MD  Addendum 2/18, received labs as below.  Message to patient  Results for orders placed or performed in visit on 11/17/23  CBC   Collection Time: 11/17/23  4:06 PM  Result Value Ref Range   WBC 5.6 4.0 - 10.5 K/uL   RBC 4.12 3.87 - 5.11 Mil/uL    Platelets 197.0 150.0 - 400.0 K/uL   Hemoglobin 13.0 12.0 - 15.0 g/dL   HCT 86.5 78.4 - 69.6 %   MCV 93.4 78.0 - 100.0 fl   MCHC 33.9 30.0 - 36.0 g/dL   RDW 29.5 28.4 - 13.2 %  Comprehensive metabolic panel   Collection Time: 11/17/23  4:06 PM  Result Value Ref Range   Sodium 137 135 - 145 mEq/L   Potassium 4.2 3.5 - 5.1 mEq/L   Chloride 97 96 - 112 mEq/L   CO2 31 19 - 32 mEq/L   Glucose, Bld 93 70 - 99 mg/dL   BUN 16 6 - 23 mg/dL   Creatinine, Ser 4.40 0.40 - 1.20 mg/dL   Total Bilirubin 0.4 0.2 - 1.2 mg/dL   Alkaline Phosphatase 56 39 - 117 U/L   AST 26 0 -  37 U/L   ALT 17 0 - 35 U/L   Total Protein 7.1 6.0 - 8.3 g/dL   Albumin 4.5 3.5 - 5.2 g/dL   GFR 40.98 >11.91 mL/min   Calcium 9.2 8.4 - 10.5 mg/dL  Hemoglobin Y7W   Collection Time: 11/17/23  4:06 PM  Result Value Ref Range   Hgb A1c MFr Bld 5.8 4.6 - 6.5 %  Lipid panel   Collection Time: 11/17/23  4:06 PM  Result Value Ref Range   Cholesterol 187 0 - 200 mg/dL   Triglycerides 295.6 0.0 - 149.0 mg/dL   HDL 21.30 >86.57 mg/dL   VLDL 84.6 0.0 - 96.2 mg/dL   LDL Cholesterol 97 0 - 99 mg/dL   Total CHOL/HDL Ratio 3    NonHDL 119.59   TSH   Collection Time: 11/17/23  4:06 PM  Result Value Ref Range   TSH 2.85 0.35 - 5.50 uIU/mL

## 2023-11-17 ENCOUNTER — Other Ambulatory Visit (HOSPITAL_COMMUNITY)
Admission: RE | Admit: 2023-11-17 | Discharge: 2023-11-17 | Disposition: A | Discharge status: Home / Self Care | Payer: 59 | Source: Ambulatory Visit | Attending: Family Medicine | Admitting: Family Medicine

## 2023-11-17 ENCOUNTER — Ambulatory Visit (INDEPENDENT_AMBULATORY_CARE_PROVIDER_SITE_OTHER): Payer: 59 | Admitting: Family Medicine

## 2023-11-17 VITALS — BP 122/70 | HR 63 | Temp 98.3°F | Resp 18 | Ht 65.5 in | Wt 153.6 lb

## 2023-11-17 DIAGNOSIS — Z13220 Encounter for screening for lipoid disorders: Secondary | ICD-10-CM

## 2023-11-17 DIAGNOSIS — Z0000 Encounter for general adult medical examination without abnormal findings: Secondary | ICD-10-CM | POA: Diagnosis not present

## 2023-11-17 DIAGNOSIS — Z1329 Encounter for screening for other suspected endocrine disorder: Secondary | ICD-10-CM | POA: Diagnosis not present

## 2023-11-17 DIAGNOSIS — Z131 Encounter for screening for diabetes mellitus: Secondary | ICD-10-CM | POA: Diagnosis not present

## 2023-11-17 DIAGNOSIS — Z124 Encounter for screening for malignant neoplasm of cervix: Secondary | ICD-10-CM | POA: Insufficient documentation

## 2023-11-17 DIAGNOSIS — D508 Other iron deficiency anemias: Secondary | ICD-10-CM

## 2023-11-18 ENCOUNTER — Encounter: Payer: Self-pay | Admitting: Family Medicine

## 2023-11-18 LAB — COMPREHENSIVE METABOLIC PANEL
ALT: 17 U/L (ref 0–35)
AST: 26 U/L (ref 0–37)
Albumin: 4.5 g/dL (ref 3.5–5.2)
Alkaline Phosphatase: 56 U/L (ref 39–117)
BUN: 16 mg/dL (ref 6–23)
CO2: 31 meq/L (ref 19–32)
Calcium: 9.2 mg/dL (ref 8.4–10.5)
Chloride: 97 meq/L (ref 96–112)
Creatinine, Ser: 0.84 mg/dL (ref 0.40–1.20)
GFR: 73.21 mL/min (ref >60.00)
Glucose, Bld: 93 mg/dL (ref 70–99)
Potassium: 4.2 meq/L (ref 3.5–5.1)
Sodium: 137 meq/L (ref 135–145)
Total Bilirubin: 0.4 mg/dL (ref 0.2–1.2)
Total Protein: 7.1 g/dL (ref 6.0–8.3)

## 2023-11-18 LAB — CBC
HCT: 38.5 % (ref 36.0–46.0)
Hemoglobin: 13 g/dL (ref 12.0–15.0)
MCHC: 33.9 g/dL (ref 30.0–36.0)
MCV: 93.4 fL (ref 78.0–100.0)
Platelets: 197 10*3/uL (ref 150.0–400.0)
RBC: 4.12 Mil/uL (ref 3.87–5.11)
RDW: 14.2 % (ref 11.5–15.5)
WBC: 5.6 10*3/uL (ref 4.0–10.5)

## 2023-11-18 LAB — LIPID PANEL
Cholesterol: 187 mg/dL (ref 0–200)
HDL: 67.6 mg/dL (ref >39.00)
LDL Cholesterol: 97 mg/dL (ref 0–99)
NonHDL: 119.59
Total CHOL/HDL Ratio: 3
Triglycerides: 113 mg/dL (ref 0.0–149.0)
VLDL: 22.6 mg/dL (ref 0.0–40.0)

## 2023-11-18 LAB — TSH: TSH: 2.85 u[IU]/mL (ref 0.35–5.50)

## 2023-11-18 LAB — HEMOGLOBIN A1C: Hgb A1c MFr Bld: 5.8 % (ref 4.6–6.5)

## 2023-11-19 LAB — CYTOLOGY - PAP
Adequacy: ABSENT
Comment: NEGATIVE
Diagnosis: NEGATIVE
High risk HPV: NEGATIVE

## 2023-11-20 ENCOUNTER — Encounter: Payer: Self-pay | Admitting: Family Medicine

## 2024-04-27 ENCOUNTER — Other Ambulatory Visit: Payer: Self-pay

## 2024-05-24 ENCOUNTER — Other Ambulatory Visit (HOSPITAL_COMMUNITY): Payer: Self-pay

## 2024-05-25 ENCOUNTER — Other Ambulatory Visit (HOSPITAL_COMMUNITY): Payer: Self-pay

## 2024-05-25 MED ORDER — AMPICILLIN 500 MG PO CAPS
500.0000 mg | ORAL_CAPSULE | Freq: Two times a day (BID) | ORAL | 2 refills | Status: DC
  Filled 2024-05-25: qty 60, 30d supply, fill #0

## 2024-07-01 ENCOUNTER — Other Ambulatory Visit: Payer: Self-pay | Admitting: Family Medicine

## 2024-07-01 ENCOUNTER — Encounter: Payer: Self-pay | Admitting: Pediatrics

## 2024-07-01 DIAGNOSIS — Z1231 Encounter for screening mammogram for malignant neoplasm of breast: Secondary | ICD-10-CM

## 2024-07-13 ENCOUNTER — Ambulatory Visit
Admission: RE | Admit: 2024-07-13 | Discharge: 2024-07-13 | Disposition: A | Discharge status: Home / Self Care | Source: Ambulatory Visit | Attending: Family Medicine | Admitting: Family Medicine

## 2024-07-13 DIAGNOSIS — Z1231 Encounter for screening mammogram for malignant neoplasm of breast: Secondary | ICD-10-CM | POA: Diagnosis not present

## 2024-07-27 ENCOUNTER — Other Ambulatory Visit (HOSPITAL_COMMUNITY): Payer: Self-pay

## 2024-07-27 ENCOUNTER — Ambulatory Visit (AMBULATORY_SURGERY_CENTER)

## 2024-07-27 ENCOUNTER — Other Ambulatory Visit: Payer: Self-pay

## 2024-07-27 ENCOUNTER — Encounter: Payer: Self-pay | Admitting: Pediatrics

## 2024-07-27 VITALS — Ht 66.0 in | Wt 150.0 lb

## 2024-07-27 DIAGNOSIS — Z1211 Encounter for screening for malignant neoplasm of colon: Secondary | ICD-10-CM

## 2024-07-27 MED ORDER — NA SULFATE-K SULFATE-MG SULF 17.5-3.13-1.6 GM/177ML PO SOLN
1.0000 | Freq: Once | ORAL | 0 refills | Status: AC
  Filled 2024-07-27: qty 354, 1d supply, fill #0

## 2024-07-27 NOTE — Progress Notes (Signed)
 No issues known to pt with past sedation with any surgeries or procedures Patient denies ever being told they had issues or difficulty with intubation  No FH of Malignant Hyperthermia Pt is not on diet pills nor GLP-1 medications Pt is not on home 02  Pt is not on blood thinners  Pt denies issues with chronic constipation; bm 1q3 days  No A fib or A flutter Have any cardiac testing pending--no Pt instructed to use [email or web address] or GoodRx for a price reduction on prep  Ambulates independently

## 2024-07-28 ENCOUNTER — Other Ambulatory Visit (HOSPITAL_COMMUNITY): Payer: Self-pay

## 2024-07-28 MED ORDER — FLUZONE 0.5 ML IM SUSY
0.5000 mL | PREFILLED_SYRINGE | Freq: Once | INTRAMUSCULAR | 0 refills | Status: AC
  Filled 2024-07-28: qty 0.5, 1d supply, fill #0

## 2024-07-28 MED ORDER — COVID-19 MRNA VAC-TRIS(PFIZER) 30 MCG/0.3ML IM SUSY
0.3000 mL | PREFILLED_SYRINGE | Freq: Once | INTRAMUSCULAR | 0 refills | Status: AC
  Filled 2024-07-28: qty 0.3, 1d supply, fill #0

## 2024-08-04 NOTE — Unmapped External Note (Signed)
 Assessment Note   Demographics Verification - Call Monitoring - Confidentiality      Member Verification: Member Verification    Call Monitoring Disclaimer: Call Monitoring Disclaimer         Person Providing Info on the Call   Who is the person providing the information on the call? Member      Limitations/Preferences   What, if any, physical limitations, health literacy, language needs and learning preferences do you have that I should be aware of when we talk?        Specify other language limitations        Specify other physical limitations        LCC Contact Type   Select the Health Your Way contact type: 4th or more contact-Telephonic Lifestyle Coaching           Lifestyle Coaching Exercise Follow Up                    General Health Perception   How would you describe your health in general? My health is excellent      SMART GOAL & SMART GOAL Follow Up     SMART GOAL    Did the member set a SMART goal? No  Did the member meet their previous SMART goal?  Yes      Brief Summary of Member Status   What medical conditions has your doctor diagnosed you with?    No past medical history on file.   Did the member score via Care Engine for any condition(s) that he/she did NOT confirm?    HYW:  What medical conditions are you most concerned about and why?    In between MD visits people often find a need to go to the ER or an Urgent Care facility.can you tell me if you had any concerns or issues in which you went to an ER or Urgent care in the last 12 months?      Condition Specific Metrics   Height/Weight/BMI Height: 5' 7/Weight: 150 lb/BMI (Calculated): 23.5   Blood Pressure    Outcome Metrics      Medications   Current Medications[1]    There are no discontinued medications.     Medication Review         Depression Screening PHQ2/PHQ9 Exclusions   PHQ2 EXCLUSION CRITERIA:  Do NOT administer the PHQ-2 if any  of the following apply to this member.   PHQ9 EXCLUSION CRITERIA: To determine if this assessment is right for you, I need to ask you some questions before we start.  Besides depression, have you been diagnosed with any other mental health issues such as: No applicable exclusions              Depression PHQ2/PHQ9 Screening Results     PHQ2  The PHQ-2 questions are scored from 0 (not at all) to 3 (nearly every day) and then added together: Score 0-2 = Not likely to be at risk for depression, Score 3-6 = At risk for depression (further screening recommended).        PHQ9 PHQ-9 Mini Module-If diagnosis of depression- show confirmed dx of depression and show PHQ-9 score & score description        Social Drivers of Health   Tobacco Use: Low Risk  (07/27/2024)   Received from [facility]   Patient History   . Smoking Tobacco Use: Never   . Smokeless Tobacco Use: Never   . Passive Exposure: Never  Lab Results   Results     There are no results available from this visit.         Immunizations   Immunizations Mini Module (show all Q/A pairs answered during this encounter)       Intervention/Actions   Education Topics Discussed      Referrals & Referral Follow Up          MEP/Mobile App Registration & Education on Tools/Resources   Is the member registered on the Member Engagement Platform (MEP) or Mobile App?  Explain tools and resources available on Member Engagement Platform [facility]) and how to get the most benefit out of them.  Indicate resources reviewed with member: Yes-registered on [facility] Yes-registered on mobile app 04/19/2024    Messaging with coach [employer] [facility] (if applicable)    Next Scheduled Appointment     Encounter Information   This patient does not currently have any appointments scheduled.       Follow Up Needs Identified   Time zone: EST Member's focus for today: call 4 exercise  CONE Conditions:    none TOBACCO USE: none  Pre MH: 30  Post MH:  40 SDoH ACCESSED: none  Health Index/ Mbr Rates Health: excellent; wants to stay in shape and remain physically active. WT: 150 LBS   HT:5"7  BMI: 23.5 LTG:  increase getting in shape/ Maintain shape  Motivators: schedules exercise class. Likes to ride 2 hours on weekend. Stress: only at work. Sleep: 6hrs. States is good Support:  [employer]; likes to ride bike outdoors, enjoys being outside to exercise.  Barriers: long, crazy work hours now. drive home  Interventions/education:  Met goal 100% exercise. IS TRYING TO get motivated and listening to books on tape from Junella Shaw, partnered with close friend Janese) for accountability exercise buddy. She and her talk all the time and set goals together. Sleeping well at night., Meeting all goals with exercise.  Is still riding her bike on weekends and enjoys it.  Discussed exercise as it relates to better sleep.  Moved to messaging at this time  MEP/HRA/Incentive reviewed/ messaging, GC PLAN FOR NEXT CALL:  moved to messaging.          [1]  Current Outpatient Medications:  .  amoxicillin  (AMOXIL ) 250 MG capsule, Take 250 mg by mouth 3 (three) times a day, Disp: , Rfl:  .  calcium-vitamin D  (OY[place] SHELL CALCIUM-VIT D3) 500 mg-5 mcg (200 unit) tablet, Take 1 tablet by mouth 2 (two) times a day with meals, Disp: , Rfl:  .  CREATINE MONOHYDRATE ORAL, Take by mouth, Disp: , Rfl:  .  magnesium  oxide (MAG-OX) 400 mg (241.3 mg magnesium ) tablet, Take 400 mg by mouth daily, Disp: , Rfl:  .  mv-mn/om3/dha/epa/fish/lut/zea (OCUVITE ADULT 50 PLUS ORAL), Take by mouth, Disp: , Rfl:

## 2024-08-08 NOTE — Progress Notes (Unsigned)
 [facility] History and Physical   Primary Care Physician:  Watt Harlene BROCKS, MD   Reason for Procedure:  Colorectal cancer screening  Plan:    Colonoscopy   The patient was provided an opportunity to ask questions and all were answered. The patient agreed with the plan.   HPI: Taylor Cain is a 63 y.o. female undergoing colonoscopy for colorectal cancer screening.  Patient's last colonoscopy was performed in 2015 and was normal.  No family history of colorectal cancer or polyps.  Patient denies current symptoms of rectal bleeding or change in bowel habits.   Past Medical History:  Diagnosis Date   Allergy    Anemia    Arthritis    right shoulder   Heart palpitations    made aware when patient was in 20's-30's; states they are very infrequent and pt. remains asymptomatic   History of pneumonia    Seasonal allergies     Past Surgical History:  Procedure Laterality Date   COLONOSCOPY     KNEE ARTHROSCOPY Right 1990, 1992   torn acl   KNEE ARTHROSCOPY WITH MENISCAL REPAIR Right 09/26/2021   Procedure: RIGHT KNEE ARTHROSCOPY WITH PARTIAL MEDIAL MENISCECTOMY AND CHONDROPLASTY MEDIAL FEMORAL CONDYLE AND TROCHLEA;  Surgeon: Jerri Kay HERO, MD;  Location: [facility];  Service: Orthopedics;  Laterality: Right;   SHOULDER ARTHROSCOPY WITH ROTATOR CUFF REPAIR Right 01/02/2017   Procedure: RIGHT SHOULDER ARTHROSCOPY WITH REVISION OF ROTATOR CUFF REPAIR;  Surgeon: Franky Pointer, MD;  Location: [facility] OR;  Service: Orthopedics;  Laterality: Right;   SHOULDER ARTHROSCOPY WITH ROTATOR CUFF REPAIR AND SUBACROMIAL DECOMPRESSION Right 07/25/2016   Procedure: RIGHT SHOULDER ARTHROSCOPY WITH ROTATOR CUFF REPAIR AND SUBACROMIAL DECOMPRESSION, DISTAL CLAVICLE RESECTION;  Surgeon: Franky Pointer, MD;  Location: [facility] OR;  Service: Orthopedics;  Laterality: Right;    Prior to Admission medications   Medication Sig Start Date End Date Taking? Authorizing Provider  ampicillin   (PRINCIPEN) 500 MG capsule Take 1 capsule (500 mg total) by mouth 2 (two) times daily. Patient not taking: Reported on 07/27/2024 05/25/24     Cholecalciferol 50 MCG (2000 UT) CAPS Take 2,000 Units by mouth daily.     [provider]  clindamycin  (CLINDAGEL ) 1 % gel APPLY TO THE AFFECTED AREA(S) ON THE SKIN TWICE A DAY FOR ROSACEA Patient not taking: Reported on 07/27/2024 06/10/22     CREATINE PO Take by mouth.    [provider]  multivitamin-lutein (OCUVITE-LUTEIN) CAPS capsule Take 1 capsule by mouth daily. Patient not taking: Reported on 07/27/2024    [provider]    Current Outpatient Medications  Medication Sig Dispense Refill   ampicillin  (PRINCIPEN) 500 MG capsule Take 1 capsule (500 mg total) by mouth 2 (two) times daily. (Patient not taking: Reported on 07/27/2024) 60 capsule 2   Cholecalciferol 50 MCG (2000 UT) CAPS Take 2,000 Units by mouth daily.      clindamycin  (CLINDAGEL ) 1 % gel APPLY TO THE AFFECTED AREA(S) ON THE SKIN TWICE A DAY FOR ROSACEA (Patient not taking: Reported on 07/27/2024) 60 g 2   CREATINE PO Take by mouth.     multivitamin-lutein (OCUVITE-LUTEIN) CAPS capsule Take 1 capsule by mouth daily. (Patient not taking: Reported on 07/27/2024)     No current facility-administered medications for this visit.    Allergies as of 08/10/2024 - Review Complete 07/27/2024  Allergen Reaction Noted   Acetaminophen -codeine Anxiety 07/16/2016    Family History  Problem Relation Age of Onset   Migraines Mother  Hypertension Mother    Hypertension Maternal Grandmother    Alzheimer's disease Maternal Grandmother    Diabetes Maternal Grandfather    Colon cancer Neg Hx    Esophageal cancer Neg Hx    Stomach cancer Neg Hx    Rectal cancer Neg Hx    Colon polyps Neg Hx     Social History   Socioeconomic History   Marital status: Married    Spouse name: Not on file   Number of children: Not on file   Years of education: Not on file    Highest education level: Not on file  Occupational History   Occupation: DIRECTOR    Employer: [employer]  Tobacco Use   Smoking status: Never    Passive exposure: Never   Smokeless tobacco: Never  Vaping Use   Vaping status: Never Used  Substance and Sexual Activity   Alcohol use: Yes    Alcohol/week: 3.0 standard drinks of alcohol    Types: 3 Glasses of wine per week    Comment: social   Drug use: No   Sexual activity: Not on file  Other Topics Concern   Not on file  Social History Narrative   No regular exercise   Eat fruits and veggies, no fast food, lean meats   Social Drivers of Corporate Investment Banker Strain: Not on file  Food Insecurity: Not on file  Transportation Needs: Not on file  Physical Activity: Not on file  Stress: Not on file  Social Connections: Not on file  Intimate Partner Violence: Not on file    Review of Systems:  All other review of systems negative except as mentioned in the HPI.  Physical Exam: Vital signs LMP 01/28/2014   General:   Alert,  Well-developed, well-nourished, pleasant and cooperative in NAD Airway:  Mallampati  Lungs:  Clear throughout to auscultation.   Heart:  Regular rate and rhythm; no murmurs, clicks, rubs,  or gallops. Abdomen:  Soft, nontender and nondistended. Normal bowel sounds.   Neuro/Psych:  Normal mood and affect. A and O x 3  Inocente Hausen, MD [facility]

## 2024-08-10 ENCOUNTER — Ambulatory Visit: Admitting: Pediatrics

## 2024-08-10 ENCOUNTER — Encounter: Payer: Self-pay | Admitting: Pediatrics

## 2024-08-10 VITALS — BP 127/72 | HR 60 | Temp 97.6°F | Resp 15 | Ht 66.0 in | Wt 150.0 lb

## 2024-08-10 DIAGNOSIS — Q439 Congenital malformation of intestine, unspecified: Secondary | ICD-10-CM | POA: Diagnosis not present

## 2024-08-10 DIAGNOSIS — K648 Other hemorrhoids: Secondary | ICD-10-CM

## 2024-08-10 DIAGNOSIS — Z1211 Encounter for screening for malignant neoplasm of colon: Secondary | ICD-10-CM | POA: Diagnosis not present

## 2024-08-10 DIAGNOSIS — K5730 Diverticulosis of large intestine without perforation or abscess without bleeding: Secondary | ICD-10-CM | POA: Diagnosis not present

## 2024-08-10 DIAGNOSIS — K562 Volvulus: Secondary | ICD-10-CM

## 2024-08-10 MED ORDER — SODIUM CHLORIDE 0.9 % IV SOLN: 500.0000 mL | INTRAVENOUS | Status: DC

## 2024-08-10 NOTE — Progress Notes (Signed)
 Transferred to PACU via stretcher. Patient arousing to stimulation.  VSS upon leaving procedure room.

## 2024-08-10 NOTE — Progress Notes (Signed)
 Pt's states no medical or surgical changes since previsit or office visit.

## 2024-08-10 NOTE — Patient Instructions (Signed)
 Resume previous diet. Continue present medications. Repeat colonoscopy in 10 years for screening purposes. Handout provided on diverticulosis and hemorrhoids.  YOU HAD AN ENDOSCOPIC PROCEDURE TODAY AT THE [facility]:   Refer to the procedure report that was given to you for any specific questions about what was found during the examination.  If the procedure report does not answer your questions, please call your gastroenterologist to clarify.  If you requested that your care partner not be given the details of your procedure findings, then the procedure report has been included in a sealed envelope for you to review at your convenience later.  YOU SHOULD EXPECT: Some feelings of bloating in the abdomen. Passage of more gas than usual.  Walking can help get rid of the air that was put into your GI tract during the procedure and reduce the bloating. If you had a lower endoscopy (such as a colonoscopy or flexible sigmoidoscopy) you may notice spotting of blood in your stool or on the toilet paper. If you underwent a bowel prep for your procedure, you may not have a normal bowel movement for a few days.  Please Note:  You might notice some irritation and congestion in your nose or some drainage.  This is from the oxygen used during your procedure.  There is no need for concern and it should clear up in a day or so.  SYMPTOMS TO REPORT IMMEDIATELY:  Following lower endoscopy (colonoscopy or flexible sigmoidoscopy):  Excessive amounts of blood in the stool  Significant tenderness or worsening of abdominal pains  Swelling of the abdomen that is new, acute  Fever of 100F or higher  For urgent or emergent issues, a gastroenterologist can be reached at any hour by calling [phone]. Do not use MyChart messaging for urgent concerns.    DIET:  We do recommend a small meal at first, but then you may proceed to your regular diet.  Drink plenty of fluids but you should avoid alcoholic  beverages for 24 hours.  ACTIVITY:  You should plan to take it easy for the rest of today and you should NOT DRIVE or use heavy machinery until tomorrow (because of the sedation medicines used during the test).    FOLLOW UP: Our staff will call the number listed on your records the next business day following your procedure.  We will call around 7:15- 8:00 am to check on you and address any questions or concerns that you may have regarding the information given to you following your procedure. If we do not reach you, we will leave a message.     If any biopsies were taken you will be contacted by phone or by letter within the next 1-3 weeks.  Please call us  at [phone] if you have not heard about the biopsies in 3 weeks.    SIGNATURES/CONFIDENTIALITY: You and/or your care partner have signed paperwork which will be entered into your electronic medical record.  These signatures attest to the fact that that the information above on your After Visit Summary has been reviewed and is understood.  Full responsibility of the confidentiality of this discharge information lies with you and/or your care-partner.

## 2024-08-10 NOTE — Op Note (Signed)
 [facility] Patient Name: Taylor Cain Procedure Date: 08/10/2024 8:26 AM MRN: [id number] Endoscopist: Inocente Hausen , MD, [id number] Age: 63 Referring MD:  Date of Birth: 1961/03/15 Gender: Female Account #: [id number] Procedure:                Colonoscopy Indications:              Screening for colorectal malignant neoplasm, Last                            colonoscopy: 2015 Medicines:                Monitored Anesthesia Care Procedure:                Pre-Anesthesia Assessment:                           - Prior to the procedure, a History and Physical                            was performed, and patient medications and                            allergies were reviewed. The patient's tolerance of                            previous anesthesia was also reviewed. The risks                            and benefits of the procedure and the sedation                            options and risks were discussed with the patient.                            All questions were answered, and informed consent                            was obtained. Prior Anticoagulants: The patient has                            taken no anticoagulant or antiplatelet agents. ASA                            Grade Assessment: II - A patient with mild systemic                            disease. After reviewing the risks and benefits,                            the patient was deemed in satisfactory condition to                            undergo the procedure.  After obtaining informed consent, the colonoscope                            was passed under direct vision. Throughout the                            procedure, the patient's blood pressure, pulse, and                            oxygen saturations were monitored continuously. The                            Olympus Scope [id number] was introduced through the                            anus and advanced to the cecum,  identified by                            appendiceal orifice and ileocecal valve. The                            colonoscopy was technically difficult and complex                            due to significant looping and a tortuous colon.                            Successful completion of the procedure was aided by                            changing the patient's position and using manual                            pressure. The patient tolerated the procedure well.                            The quality of the bowel preparation was good. The                            ileocecal valve, appendiceal orifice, and rectum                            were photographed. Scope In: 8:42:10 AM Scope Out: 8:59:55 AM Scope Withdrawal Time: 0 hours 7 minutes 43 seconds  Total Procedure Duration: 0 hours 17 minutes 45 seconds  Findings:                 The perianal and digital rectal examinations were                            normal. Pertinent negatives include normal                            sphincter tone and no palpable rectal lesions.  A few small-mouthed diverticula were found in the                            sigmoid colon.                           The colon (entire examined portion) appeared normal.                           Internal hemorrhoids were found during retroflexion. Complications:            No immediate complications. Estimated blood loss:                            None. Estimated Blood Loss:     Estimated blood loss: none. Impression:               - Diverticulosis in the sigmoid colon.                           - The entire examined colon is normal.                           - Internal hemorrhoids.                           - No specimens collected. Recommendation:           - Discharge patient to home (ambulatory).                           - Repeat colonoscopy in 10 years for screening                            purposes.                            - The findings and recommendations were discussed                            with the patient's family.                           - Return to referring physician.                           - Patient has a contact number available for                            emergencies. The signs and symptoms of potential                            delayed complications were discussed with the                            patient. Return to normal activities tomorrow.  Written discharge instructions were provided to the                            patient. Inocente Hausen, MD 08/10/2024 9:03:42 AM This report has been signed electronically.

## 2024-08-11 ENCOUNTER — Telehealth: Payer: Self-pay

## 2024-08-11 NOTE — Telephone Encounter (Signed)
  Follow up Call-     08/10/2024    7:46 AM  Call back number  Post procedure Call Back phone  # [phone]  Permission to leave phone message Yes    Left Message

## 2024-08-31 ENCOUNTER — Encounter: Admitting: Plastic Surgery

## 2024-10-01 ENCOUNTER — Ambulatory Visit (INDEPENDENT_AMBULATORY_CARE_PROVIDER_SITE_OTHER): Payer: Self-pay | Admitting: Plastic Surgery

## 2024-10-01 DIAGNOSIS — Z719 Counseling, unspecified: Secondary | ICD-10-CM

## 2024-10-01 NOTE — Progress Notes (Signed)

## 2024-11-17 ENCOUNTER — Encounter: Payer: 59 | Admitting: Family Medicine
